# Patient Record
Sex: Male | Born: 1935 | Race: White | Hispanic: No | Marital: Married | Smoking: Never smoker
Health system: Southern US, Community
[De-identification: ages and names within clinical notes are randomized; demographics above are authoritative.]

## PROBLEM LIST (undated history)

## (undated) DIAGNOSIS — E789 Disorder of lipoprotein metabolism, unspecified: Secondary | ICD-10-CM

## (undated) DIAGNOSIS — E785 Hyperlipidemia, unspecified: Secondary | ICD-10-CM

## (undated) DIAGNOSIS — F0390 Unspecified dementia without behavioral disturbance: Secondary | ICD-10-CM

## (undated) DIAGNOSIS — I10 Essential (primary) hypertension: Secondary | ICD-10-CM

## (undated) HISTORY — PX: NASAL POLYP EXCISION: SHX2068

---

## 2017-12-30 DIAGNOSIS — U071 COVID-19: Secondary | ICD-10-CM

## 2017-12-30 HISTORY — DX: COVID-19: U07.1

## 2021-02-05 ENCOUNTER — Encounter (INDEPENDENT_AMBULATORY_CARE_PROVIDER_SITE_OTHER): Payer: Self-pay | Admitting: *Deleted

## 2021-04-02 ENCOUNTER — Inpatient Hospital Stay (HOSPITAL_COMMUNITY)
Admission: EM | Admit: 2021-04-02 | Discharge: 2021-04-06 | DRG: 065 | Disposition: A | Discharge status: Rehabilitation Facility | Payer: Medicare Other | Attending: Family Medicine | Admitting: Family Medicine

## 2021-04-02 ENCOUNTER — Emergency Department (HOSPITAL_COMMUNITY): Payer: Medicare Other

## 2021-04-02 ENCOUNTER — Other Ambulatory Visit: Payer: Self-pay

## 2021-04-02 ENCOUNTER — Encounter (HOSPITAL_COMMUNITY): Payer: Self-pay | Admitting: *Deleted

## 2021-04-02 DIAGNOSIS — F0390 Unspecified dementia without behavioral disturbance: Secondary | ICD-10-CM | POA: Diagnosis present

## 2021-04-02 DIAGNOSIS — Z20822 Contact with and (suspected) exposure to covid-19: Secondary | ICD-10-CM | POA: Diagnosis present

## 2021-04-02 DIAGNOSIS — R531 Weakness: Secondary | ICD-10-CM | POA: Diagnosis not present

## 2021-04-02 DIAGNOSIS — I714 Abdominal aortic aneurysm, without rupture, unspecified: Secondary | ICD-10-CM

## 2021-04-02 DIAGNOSIS — Z8616 Personal history of COVID-19: Secondary | ICD-10-CM

## 2021-04-02 DIAGNOSIS — N1831 Chronic kidney disease, stage 3a: Secondary | ICD-10-CM | POA: Diagnosis present

## 2021-04-02 DIAGNOSIS — I6381 Other cerebral infarction due to occlusion or stenosis of small artery: Principal | ICD-10-CM | POA: Diagnosis present

## 2021-04-02 DIAGNOSIS — M48061 Spinal stenosis, lumbar region without neurogenic claudication: Secondary | ICD-10-CM | POA: Diagnosis present

## 2021-04-02 DIAGNOSIS — M47816 Spondylosis without myelopathy or radiculopathy, lumbar region: Secondary | ICD-10-CM | POA: Diagnosis present

## 2021-04-02 DIAGNOSIS — E785 Hyperlipidemia, unspecified: Secondary | ICD-10-CM | POA: Diagnosis present

## 2021-04-02 DIAGNOSIS — R29898 Other symptoms and signs involving the musculoskeletal system: Secondary | ICD-10-CM

## 2021-04-02 DIAGNOSIS — Z79899 Other long term (current) drug therapy: Secondary | ICD-10-CM

## 2021-04-02 DIAGNOSIS — F0391 Unspecified dementia with behavioral disturbance: Secondary | ICD-10-CM | POA: Diagnosis present

## 2021-04-02 DIAGNOSIS — Z8249 Family history of ischemic heart disease and other diseases of the circulatory system: Secondary | ICD-10-CM

## 2021-04-02 DIAGNOSIS — Z87891 Personal history of nicotine dependence: Secondary | ICD-10-CM

## 2021-04-02 DIAGNOSIS — R262 Difficulty in walking, not elsewhere classified: Secondary | ICD-10-CM | POA: Diagnosis present

## 2021-04-02 DIAGNOSIS — I716 Thoracoabdominal aortic aneurysm, without rupture: Secondary | ICD-10-CM | POA: Diagnosis present

## 2021-04-02 DIAGNOSIS — E7800 Pure hypercholesterolemia, unspecified: Secondary | ICD-10-CM | POA: Diagnosis present

## 2021-04-02 DIAGNOSIS — R29703 NIHSS score 3: Secondary | ICD-10-CM | POA: Diagnosis present

## 2021-04-02 DIAGNOSIS — Z7401 Bed confinement status: Secondary | ICD-10-CM

## 2021-04-02 DIAGNOSIS — I129 Hypertensive chronic kidney disease with stage 1 through stage 4 chronic kidney disease, or unspecified chronic kidney disease: Secondary | ICD-10-CM | POA: Diagnosis present

## 2021-04-02 DIAGNOSIS — I10 Essential (primary) hypertension: Secondary | ICD-10-CM | POA: Diagnosis present

## 2021-04-02 DIAGNOSIS — I639 Cerebral infarction, unspecified: Secondary | ICD-10-CM | POA: Diagnosis present

## 2021-04-02 DIAGNOSIS — M549 Dorsalgia, unspecified: Secondary | ICD-10-CM | POA: Diagnosis present

## 2021-04-02 DIAGNOSIS — D696 Thrombocytopenia, unspecified: Secondary | ICD-10-CM

## 2021-04-02 DIAGNOSIS — Z91041 Radiographic dye allergy status: Secondary | ICD-10-CM

## 2021-04-02 DIAGNOSIS — R7303 Prediabetes: Secondary | ICD-10-CM

## 2021-04-02 HISTORY — DX: Hyperlipidemia, unspecified: E78.5

## 2021-04-02 HISTORY — DX: Unspecified dementia, unspecified severity, without behavioral disturbance, psychotic disturbance, mood disturbance, and anxiety: F03.90

## 2021-04-02 HISTORY — DX: Essential (primary) hypertension: I10

## 2021-04-02 HISTORY — DX: Disorder of lipoprotein metabolism, unspecified: E78.9

## 2021-04-02 LAB — DIFFERENTIAL
Abs Immature Granulocytes: 0.02 10*3/uL (ref 0.00–0.07)
Basophils Absolute: 0.1 10*3/uL (ref 0.0–0.1)
Basophils Relative: 1 %
Eosinophils Absolute: 0.5 10*3/uL (ref 0.0–0.5)
Eosinophils Relative: 6 %
Immature Granulocytes: 0 %
Lymphocytes Relative: 24 %
Lymphs Abs: 1.9 10*3/uL (ref 0.7–4.0)
Monocytes Absolute: 0.8 10*3/uL (ref 0.1–1.0)
Monocytes Relative: 9 %
Neutro Abs: 4.9 10*3/uL (ref 1.7–7.7)
Neutrophils Relative %: 60 %

## 2021-04-02 LAB — RESP PANEL BY RT-PCR (FLU A&B, COVID) ARPGX2
Influenza A by PCR: NEGATIVE
Influenza B by PCR: NEGATIVE
SARS Coronavirus 2 by RT PCR: NEGATIVE

## 2021-04-02 LAB — I-STAT CHEM 8, ED
BUN: 19 mg/dL (ref 8–23)
Calcium, Ion: 1.21 mmol/L (ref 1.15–1.40)
Chloride: 103 mmol/L (ref 98–111)
Creatinine, Ser: 1.5 mg/dL — ABNORMAL HIGH (ref 0.61–1.24)
Glucose, Bld: 100 mg/dL — ABNORMAL HIGH (ref 70–99)
HCT: 44 % (ref 39.0–52.0)
Hemoglobin: 15 g/dL (ref 13.0–17.0)
Potassium: 4.2 mmol/L (ref 3.5–5.1)
Sodium: 140 mmol/L (ref 135–145)
TCO2: 27 mmol/L (ref 22–32)

## 2021-04-02 LAB — COMPREHENSIVE METABOLIC PANEL
ALT: 15 U/L (ref 0–44)
AST: 16 U/L (ref 15–41)
Albumin: 3.5 g/dL (ref 3.5–5.0)
Alkaline Phosphatase: 93 U/L (ref 38–126)
Anion gap: 7 (ref 5–15)
BUN: 19 mg/dL (ref 8–23)
CO2: 27 mmol/L (ref 22–32)
Calcium: 8.6 mg/dL — ABNORMAL LOW (ref 8.9–10.3)
Chloride: 106 mmol/L (ref 98–111)
Creatinine, Ser: 1.49 mg/dL — ABNORMAL HIGH (ref 0.61–1.24)
GFR, Estimated: 47 mL/min — ABNORMAL LOW (ref >60)
Glucose, Bld: 105 mg/dL — ABNORMAL HIGH (ref 70–99)
Potassium: 4.2 mmol/L (ref 3.5–5.1)
Sodium: 140 mmol/L (ref 135–145)
Total Bilirubin: 0.7 mg/dL (ref 0.3–1.2)
Total Protein: 6.2 g/dL — ABNORMAL LOW (ref 6.5–8.1)

## 2021-04-02 LAB — CBC
HCT: 43.4 % (ref 39.0–52.0)
Hemoglobin: 14.1 g/dL (ref 13.0–17.0)
MCH: 29.7 pg (ref 26.0–34.0)
MCHC: 32.5 g/dL (ref 30.0–36.0)
MCV: 91.4 fL (ref 80.0–100.0)
Platelets: 146 10*3/uL — ABNORMAL LOW (ref 150–400)
RBC: 4.75 MIL/uL (ref 4.22–5.81)
RDW: 15.2 % (ref 11.5–15.5)
WBC: 8.2 10*3/uL (ref 4.0–10.5)
nRBC: 0 % (ref 0.0–0.2)

## 2021-04-02 LAB — ETHANOL: Alcohol, Ethyl (B): <10 mg/dL (ref <10)

## 2021-04-02 LAB — APTT: aPTT: 30 seconds (ref 24–36)

## 2021-04-02 LAB — PROTIME-INR
INR: 1.1 (ref 0.8–1.2)
Prothrombin Time: 14 seconds (ref 11.4–15.2)

## 2021-04-02 IMAGING — MR MR LUMBAR SPINE W/O CM
6 of 15 series · 12 of 48 positions shown · non-contrast
Comparison: None available.

CLINICAL DATA: Initial evaluation for low back pain, cauda equina
syndrome suspected.

EXAM:
MRI LUMBAR SPINE WITHOUT CONTRAST
TECHNIQUE: Multiplanar, multisequence MR imaging of the lumbar spine was
performed. No intravenous contrast was administered.

[Series 5: T2 · axial · 5.0mm · 0.23mm/px · z∈[-21,+126]mm · 2 of 26 slices shown (1 of 4)]
[im 1/26]
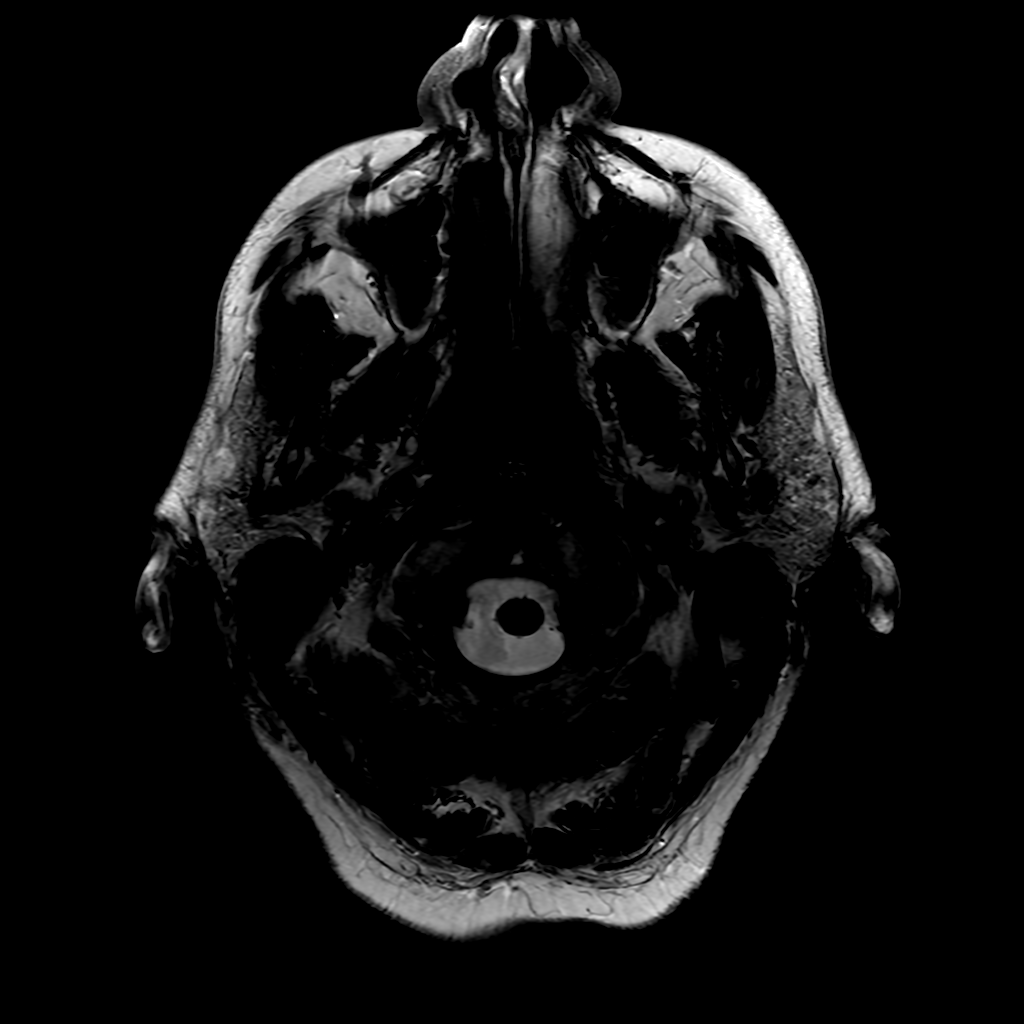
[im 26/26]
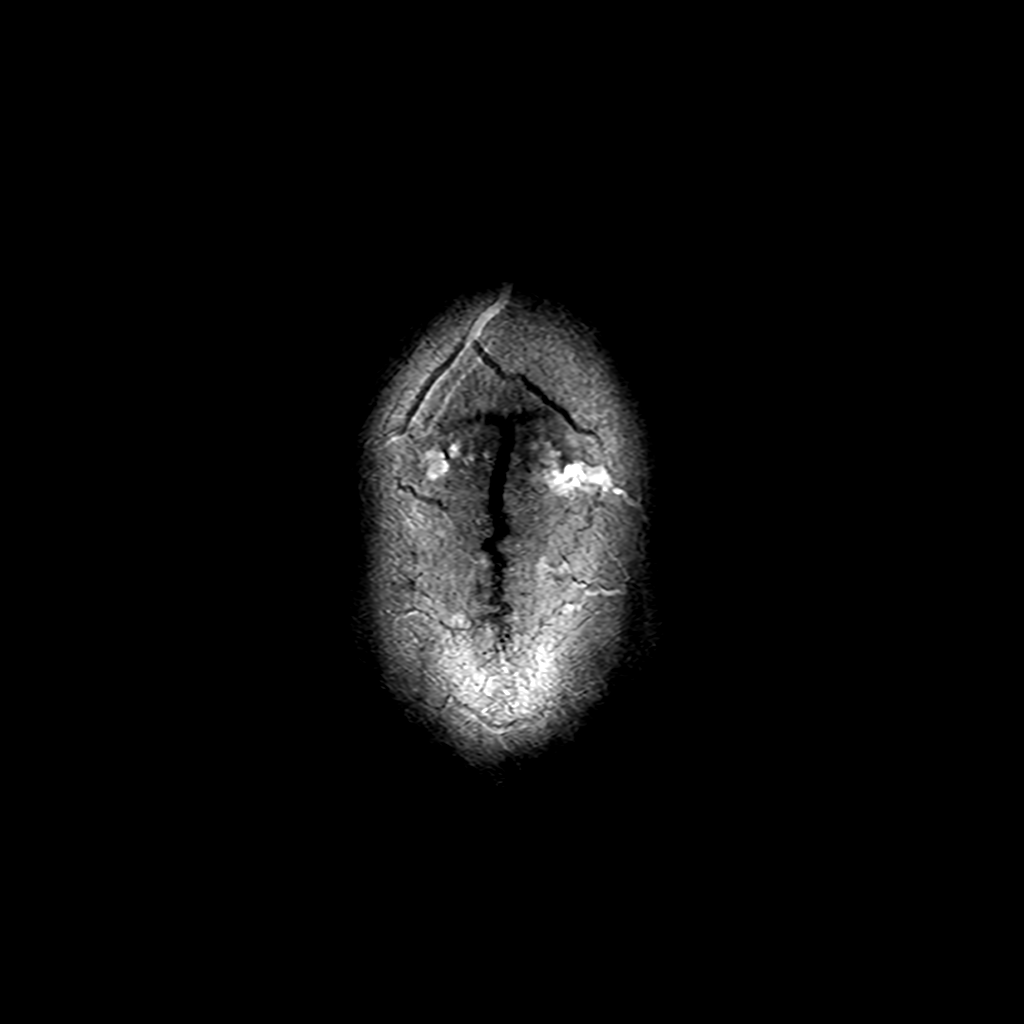

[Series 9: T2 · coronal · 5.0mm · 0.39mm/px · 2 of 31 slices shown (2 of 4)]
[im 1/31]
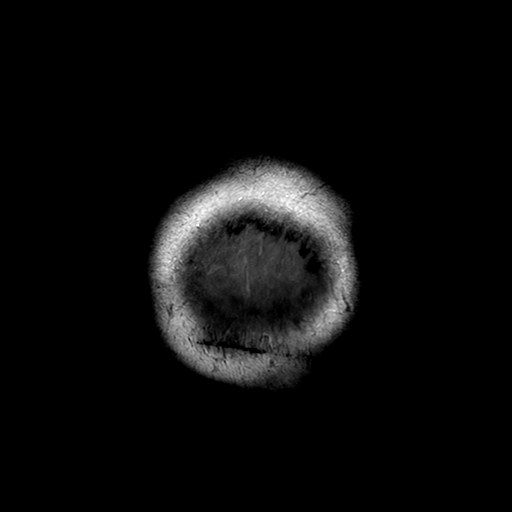
[im 31/31]
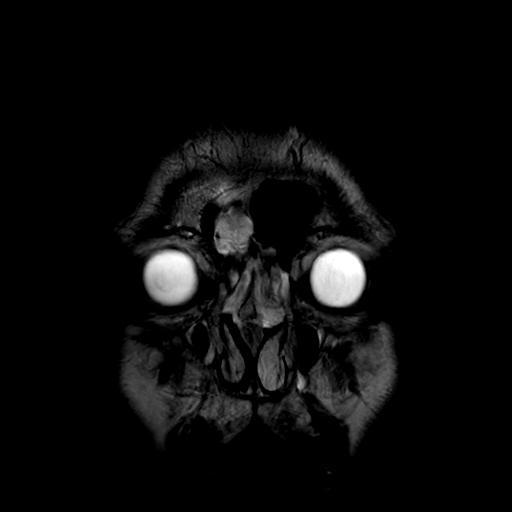

[Series 11: T2 · sagittal · 4.0mm · 0.55mm/px · 1 of 17 slices shown (3 of 4)]
[im 1/17]
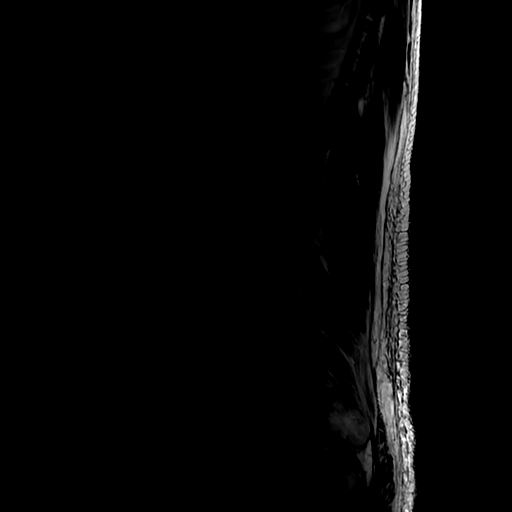

[Series 13: T1 · sagittal · 4.0mm · 0.55mm/px · 1 of 17 slices shown (1 of 2)]
[im 1/17]
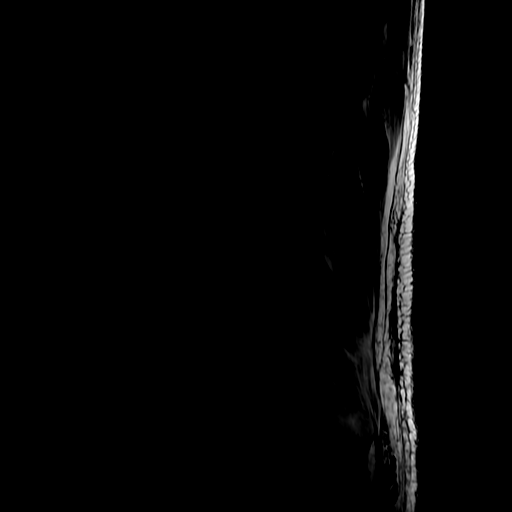

[Series 14: T2 · axial · 4.0mm · 0.39mm/px · z∈[-136,+88]mm · 3 of 46 slices shown (4 of 4)]
[im 1/46]
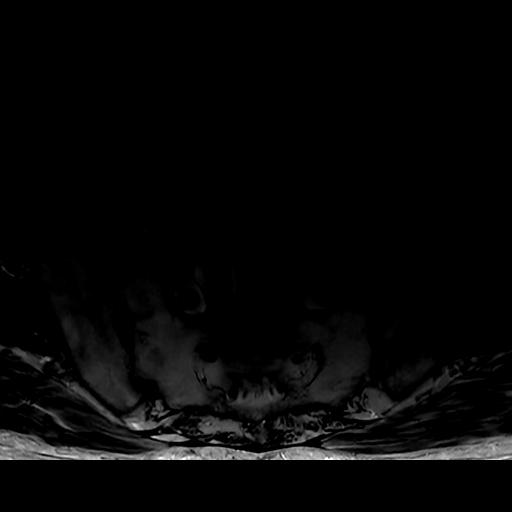
[im 23/46]
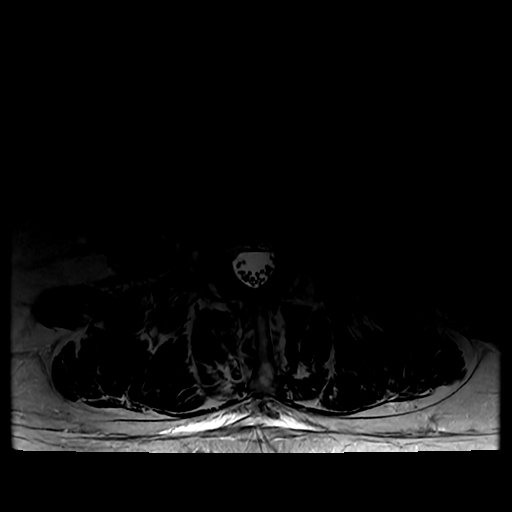
[im 46/46]
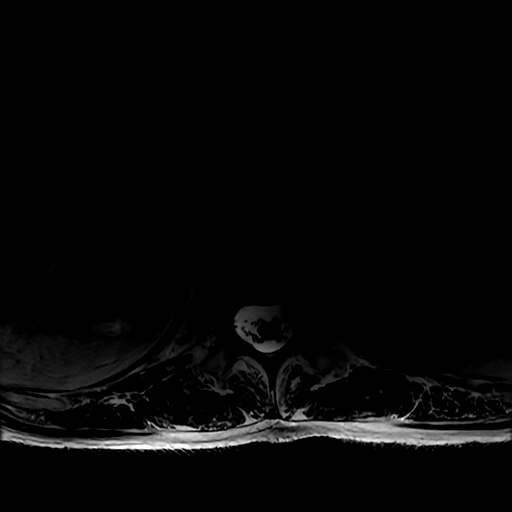

[Series 15: T1 · axial · 4.0mm · 0.39mm/px · z∈[-136,+88]mm · 3 of 46 slices shown (2 of 2)]
[im 1/46]
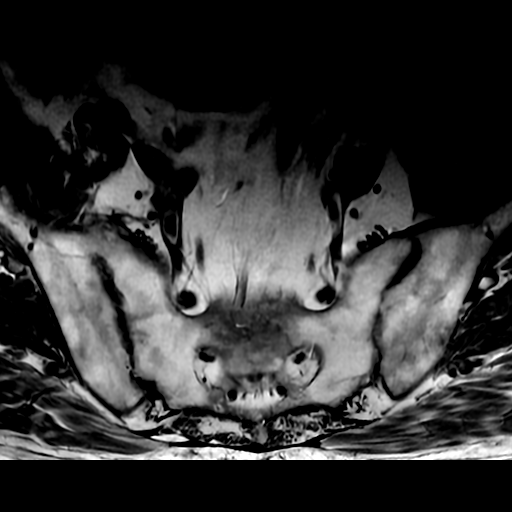
[im 23/46]
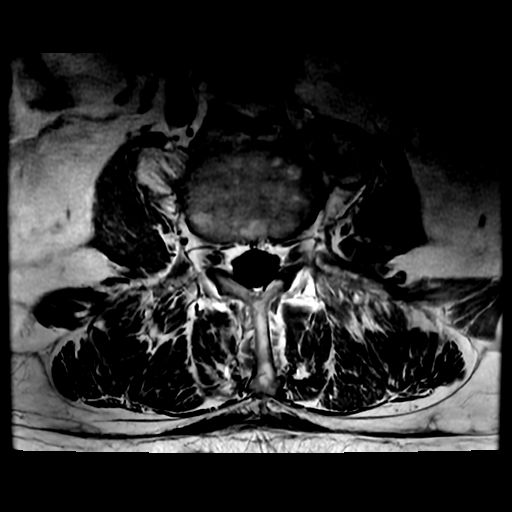
[im 46/46]
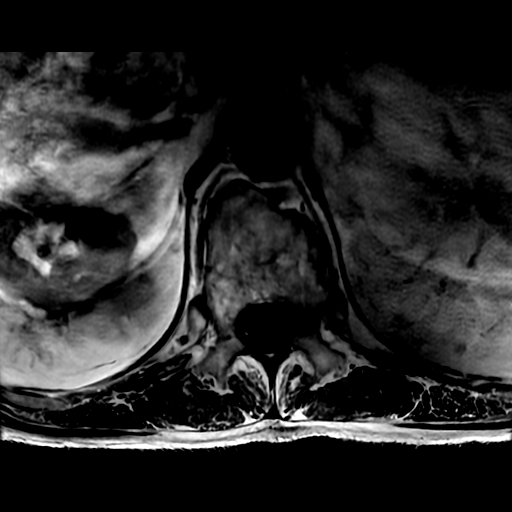

[12 of 48 positions shown; findings below may reference images not displayed]

FINDINGS: Segmentation: Standard. Lowest well-formed disc space labeled the
L5-S1 level.

Alignment: 4 mm anterolisthesis of L5 on S1, with trace 2 mm
retrolisthesis of L4 on L5. Findings chronic and facet mediated.
Alignment otherwise normal with preservation of the normal lumbar
lordosis.

Vertebrae: Vertebral body height maintained without acute or chronic
fracture. Bone marrow signal intensity diffusely heterogeneous
without discrete or worrisome osseous lesion. No abnormal marrow
edema.

Conus medullaris and cauda equina: Conus extends to the T12-L1
level. Conus and cauda equina appear normal.

Paraspinal and other soft tissues: Paraspinous soft tissues
demonstrate no acute finding. Multiple scattered benign appearing
cyst noted within the partially visualized kidneys, largest of which
measures 4.1 cm on the left. A large irregular multilobulated
aneurysm involving the intra-abdominal aorta partially visualized,
measuring up to at least 5.3 cm in AP diameter (series 11, image
12).

Disc levels:

T12-L1: Disc desiccation without significant disc bulge. Anterior
endplate osteophytic spurring. No spinal stenosis. Foramina remain
patent.

L1-2: Disc desiccation with mild disc bulge. Anterior endplate
osteophytic spurring. Mild facet and ligament flavum hypertrophy. No
significant spinal stenosis. Foramina remain patent.

L2-3: Diffuse disc bulge with disc desiccation. Reactive endplate
spurring. Mild facet and ligament flavum hypertrophy. No significant
spinal stenosis. Foramina remain patent.

L3-4: Advanced degenerative intervertebral disc space narrowing with
diffuse disc bulge and disc desiccation. Associated reactive
endplate spurring. Mild facet and ligament flavum hypertrophy. No
significant spinal stenosis. Mild bilateral L3 foraminal narrowing.
No frank impingement.

L4-5: Trace retrolisthesis. Degenerative intervertebral disc space
narrowing with diffuse disc bulge and disc desiccation. Superimposed
reactive endplate change with marginal endplate osteophytic
spurring. Superimposed small central disc protrusion indents the
ventral thecal sac (series 14, image 32). Moderate facet and
ligament flavum hypertrophy. Resultant mild canal with right worse
than left lateral recess stenosis. Moderate right worse than left L4
foraminal narrowing.

L5-S1: 4 mm anterolisthesis. Disc desiccation with mild annular disc
bulge. Severe right worse than left facet arthrosis with associated
small joint effusions. Secondary mild narrowing of the lateral
recesses. Central canal remains patent. Moderate right with mild
left L5 foraminal narrowing.
IMPRESSION: 1. No acute abnormality within the lumbar spine. No evidence for
cord compression.
2. Multifactorial degenerative changes at L4-5 with resultant mild
spinal stenosis, with moderate right worse than left L4 foraminal
narrowing.
3. 4 mm anterolisthesis of L5 on S1 with associated severe right
worse than left facet arthrosis, with resultant moderate right L5
foraminal narrowing.
4. Additional more mild degenerative disc bulging and facet
hypertrophy at L1-2 thru L3-4 without significant stenosis or neural
impingement.
5. Large irregular multilobulated aneurysm involving the
intra-abdominal aorta, partially visualized. Further assessment with
dedicated CTA of the abdomen and pelvis recommended for further
evaluation.

## 2021-04-02 IMAGING — CT CT HEAD W/O CM
3 series · 16 of 47 positions shown, 19 images · non-contrast
Comparison: None.

CLINICAL DATA: Acute neurological deficit with bilateral leg
weakness and numbness beginning this afternoon.

EXAM:
CT HEAD WITHOUT CONTRAST
TECHNIQUE: Contiguous axial images were obtained from the base of the skull
through the vertex without intravenous contrast.

[Series 2: head w o · axial · 0.44mm/px · z∈[-15,+120]mm · 10 of 33 slices shown, 13 images]
[im 3/33  brain]
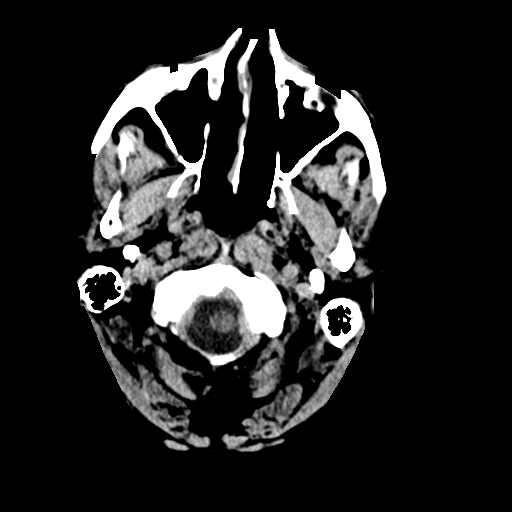
[im 3/33  bone]
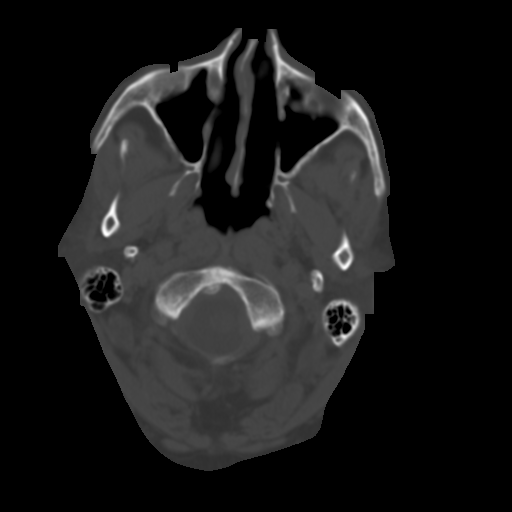
[im 6/33  brain]
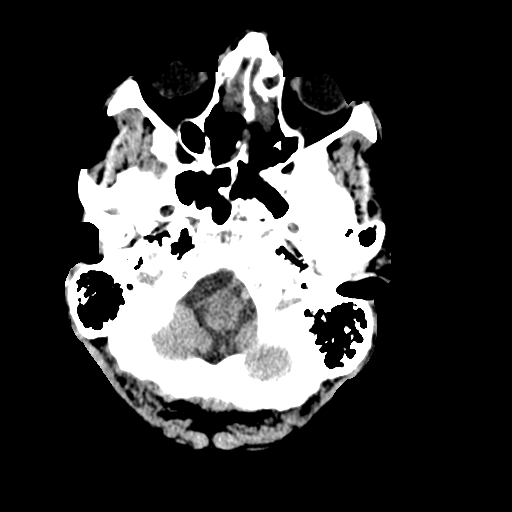
[im 9/33  brain]
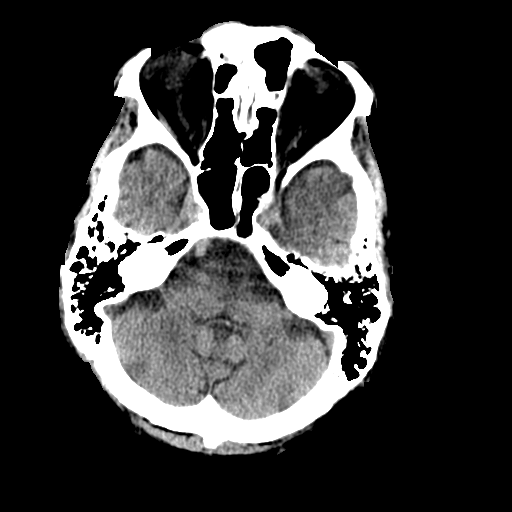
[im 12/33  brain]
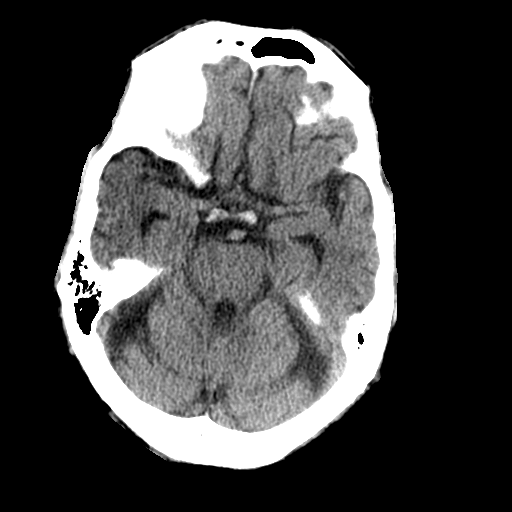
[im 15/33  brain]
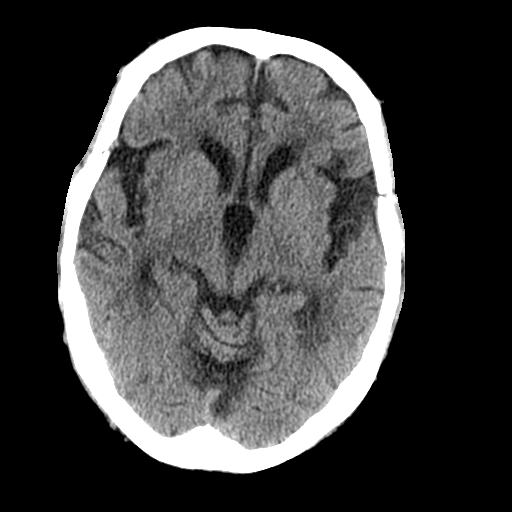
[im 15/33  bone]
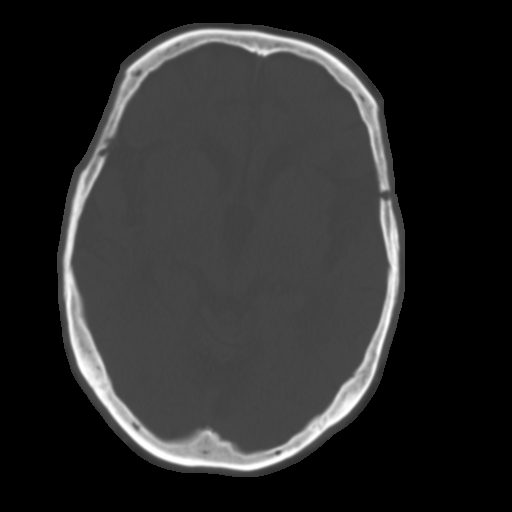
[im 18/33  brain]
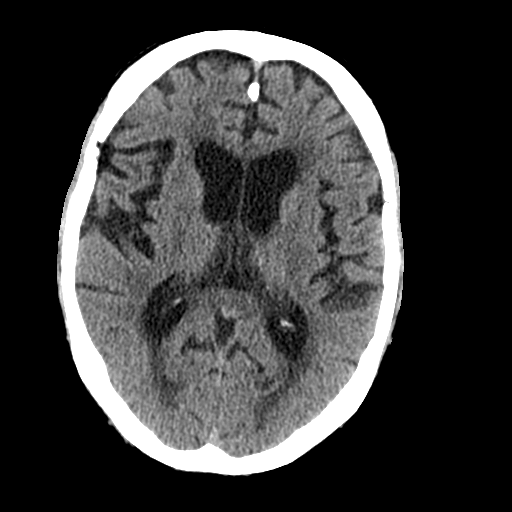
[im 21/33  brain]
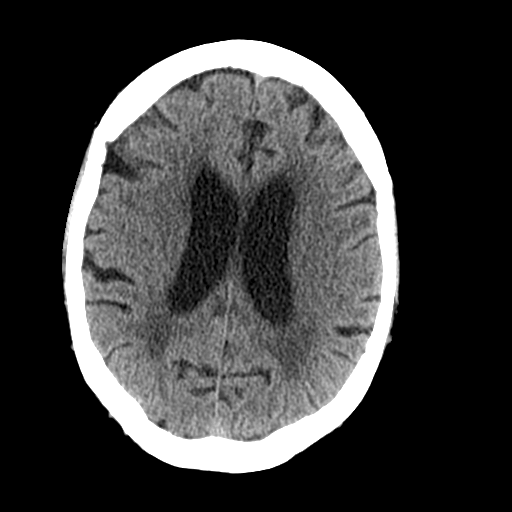
[im 25/33  brain]
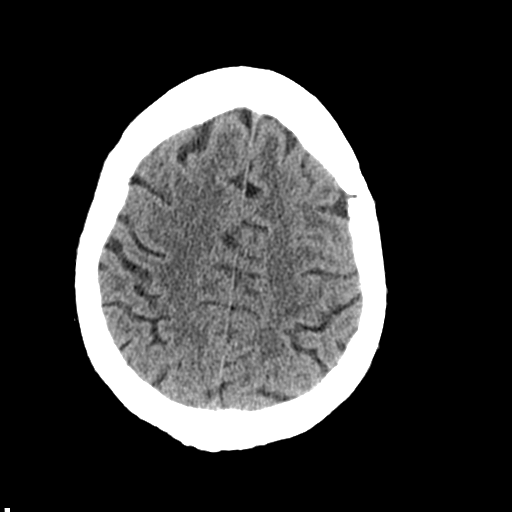
[im 27/33  brain]
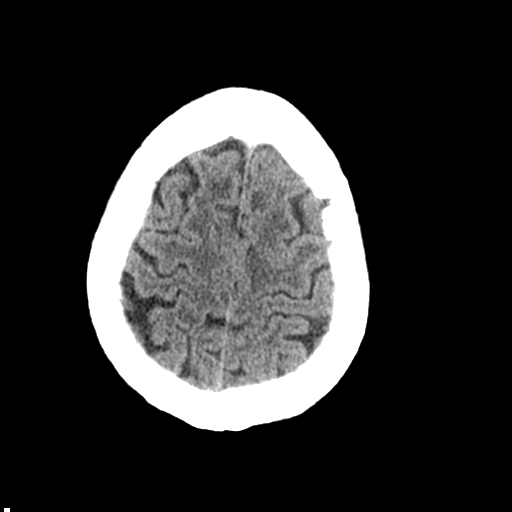
[im 27/33  bone]
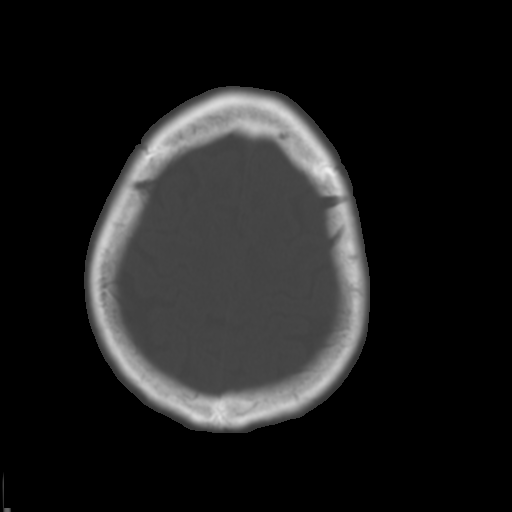
[im 30/33  brain]
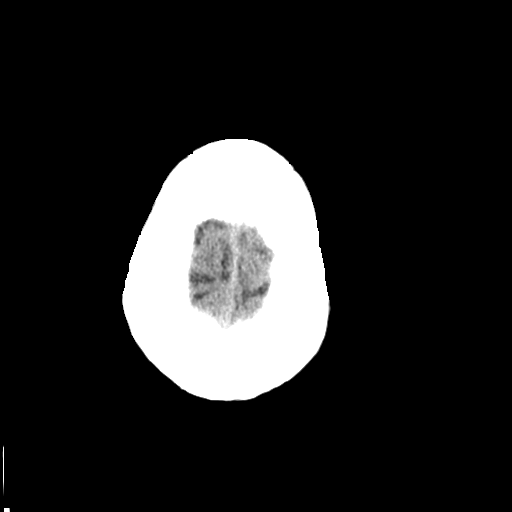

[Series 4: coronal soft · coronal · 0.33mm/px · 3 of 72 slices shown]
[im 24/72  brain]
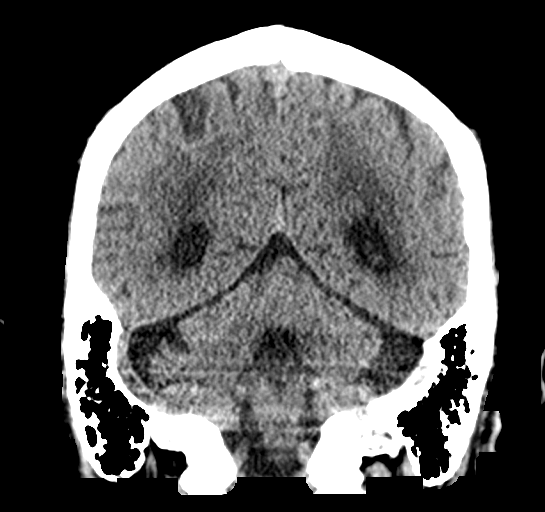
[im 32/72  brain]
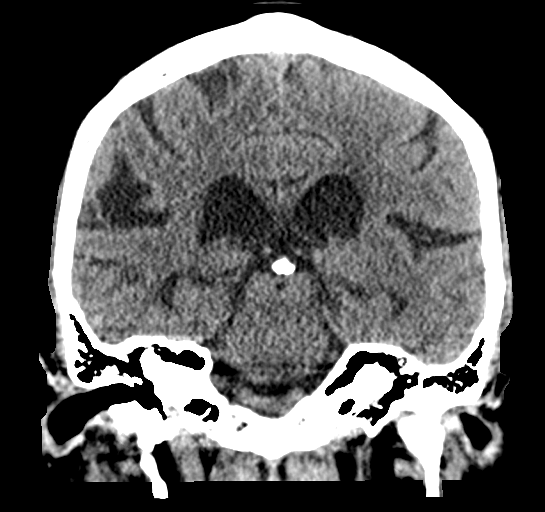
[im 40/72  brain]
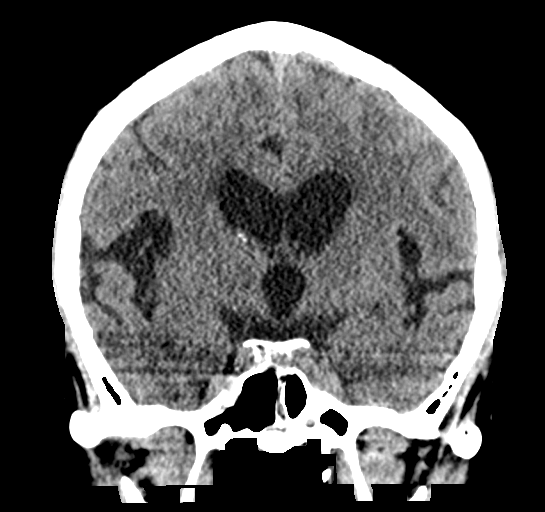

[Series 5: sagittal soft · sagittal · 0.36mm/px · 3 of 58 slices shown]
[im 20/58  brain]
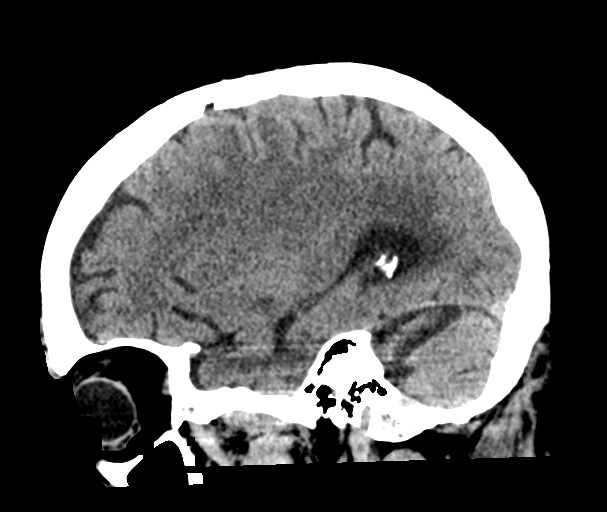
[im 29/58  brain]
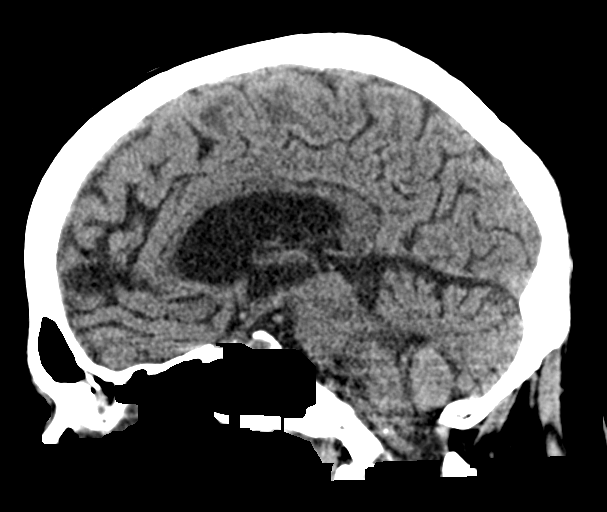
[im 39/58  brain]
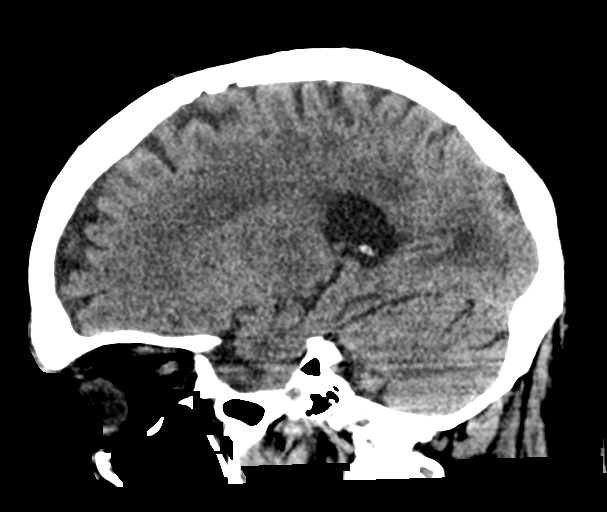

[16 of 47 positions shown; findings below may reference images not displayed]

FINDINGS: Brain: Diffuse cerebral atrophy. Ventricular dilatation consistent
with central atrophy. Low-attenuation changes in the deep white
matter consistent with small vessel ischemia. No abnormal
extra-axial fluid collections. No mass effect or midline shift.
Gray-white matter junctions are distinct. Basal cisterns are not
effaced. No acute intracranial hemorrhage.

Vascular: No hyperdense vessel or unexpected calcification.

Skull: Normal. Negative for fracture or focal lesion.

Sinuses/Orbits: Postoperative changes in the maxillary antra and
ethmoid air cells. Mild mucosal thickening in the paranasal sinuses.
No acute air-fluid levels. Mastoid air cells are clear.

Other: None.
IMPRESSION: 1. No acute intracranial abnormalities.
2. Chronic atrophy and small vessel ischemia.

## 2021-04-02 MED ORDER — SODIUM CHLORIDE 0.9 % IV SOLN: 100.0000 mL/h | INTRAVENOUS | Status: DC

## 2021-04-02 MED ORDER — SODIUM CHLORIDE 0.9 % IV BOLUS
500.0000 mL | Freq: Once | INTRAVENOUS | Status: AC
  Administered 2021-04-02: 500 mL via INTRAVENOUS

## 2021-04-02 NOTE — ED Provider Notes (Signed)
[facility] EMERGENCY DEPARTMENT Provider Note   CSN: [id number] Arrival date & time: 04/02/21  1752     History Chief Complaint  Patient presents with  . Extremity Weakness    Maxwell Rollins is a 85 y.o. male.  Pt presents to the ED today with numbness in both legs.  The pt said he was fine this morning.  He actually had a doctor's appointment this morning for a well visit and things looked good.  He and his wife went out to dinner and after sitting, he could not get up.  He felt like both legs could not move.  The pt denies back pain, but his wife said he did say that he had some back pain.  Pt's wife said they got some help to get him to the car.  They went back home and his son helped get him in the house.  They tried several times to get him up, but he still felt like his legs were not working.  He denied any urine incontinence.  The pt denies any numbness.  No other neuro sx.        Past Medical History:  Diagnosis Date  . Abnormal serum cholesterol     There are no problems to display for this patient.  Diarrhea (Discharge Diagnosis) - 01/31/21 Dementia (Discharge Diagnosis) - 01/31/21 Vertigo (Discharge Diagnosis) - 01/31/21 Hyperlipidemia (Discharge Diagnosis) - 01/31/21 Hypercholesterolemia (Discharge Diagnosis) - 01/31/21 COVID-19 (Discharge Diagnosis) - 01/31/21 Hypertension (Discharge Diagnosis) - 01/31/21    No family history on file.  Social History   Tobacco Use  . Smoking status: Never Smoker  . Smokeless tobacco: Never Used  Substance Use Topics  . Alcohol use: Never  . Drug use: Never    Home Medications Prior to Admission medications   Not on File   Medications Reconcile with Patient's Chart  atorvastatin 20 mg oral tablet 1 tab, oral, daily, # 90 tab, 0 Refill(s), Pharmacy: [facility] Start Date: 11/22/20 Status: Ordered  hydrocortisone 2.5% topical cream 1 app, topical, daily, # 28.4 g, 2 Refill(s), Pharmacy: [facility], 184, cm, 03/04/19 14:55:00 EST, Height/Length Measured Start Date: 04/29/19 Status: Ordered  meclizine 25 mg oral tablet 1/2 tab, oral, TID, PRN dizziness, # 90 tab, 3 Refill(s), Pharmacy: [facility] Start Date: 01/31/21 Status: Ordered  Saline Mist 0.65% nasal spray sprays, nasal, 0 Refill(s) Start Date: 03/04/18 Status: Ordered  Vitamin B-12 1000 mcg oral tablet 1,000 mcg = 1 tab, Oral, Daily, # 30 tab, 0 Refill(s) Start Date: 03/04/18 Status: Ordered  Vitamin B6 daily, 0 Refill(s) Start Date: 01/31/21 Status: Ordered  Vitamin C 500 mg oral tablet 500 mg = 1 tab, Oral, Daily, # 30 tab, 0 Refill(s) Start Date: 03/04/18 Status: Ordered  Vitamin D3 1250 mcg (50,000 intl units) oral capsule 50,000 IntlUnit = 1 cap, oral, every week, with food, # 13 cap, 4 Refill(s), Pharmacy: [facility] Date: 01/31/21 Status: Ordered   Allergies    Red dye  Review of Systems   Review of Systems  Neurological: Positive for weakness.  All other systems reviewed and are negative.   Physical Exam Updated Vital Signs BP 138/79 (BP Location: Left Arm)   Pulse (!) 58   Temp 98.6 F (37 C)   Resp 14   Ht 6\' 2"  (1.88 m)   Wt 92.1 kg   SpO2 99%   BMI 26.06 kg/m   Physical Exam Vitals and nursing note reviewed.  Constitutional:  Appearance: Normal appearance.  HENT:     Head: Normocephalic and atraumatic.     Right Ear: External ear normal.     Left Ear: External ear normal.     Nose: Nose normal.     Mouth/Throat:     Mouth: Mucous membranes are moist.     Pharynx: Oropharynx is clear.  Eyes:     Extraocular Movements: Extraocular movements intact.     Conjunctiva/sclera: Conjunctivae normal.     Pupils: Pupils are equal, round, and reactive to light.  Cardiovascular:     Rate and Rhythm: Normal rate and regular rhythm.     Pulses: Normal pulses.     Heart sounds: Normal heart sounds.  Pulmonary:     Effort: Pulmonary effort is normal.     Breath  sounds: Normal breath sounds.  Abdominal:     General: Abdomen is flat. Bowel sounds are normal.     Palpations: Abdomen is soft.  Musculoskeletal:        General: Normal range of motion.     Cervical back: Normal range of motion and neck supple.  Skin:    General: Skin is warm.     Capillary Refill: Capillary refill takes less than 2 seconds.  Neurological:     Mental Status: He is alert and oriented to person, place, and time.     Comments: Bilateral leg mild weakness  Psychiatric:        Mood and Affect: Mood normal.        Behavior: Behavior normal.     ED Results / Procedures / Treatments   Labs (all labs ordered are listed, but only abnormal results are displayed) Labs Reviewed  CBC - Abnormal; Notable for the following components:      Result Value   Platelets 146 (*)    All other components within normal limits  COMPREHENSIVE METABOLIC PANEL - Abnormal; Notable for the following components:   Glucose, Bld 105 (*)    Creatinine, Ser 1.49 (*)    Calcium 8.6 (*)    Total Protein 6.2 (*)    GFR, Estimated 47 (*)    All other components within normal limits  I-STAT CHEM 8, ED - Abnormal; Notable for the following components:   Creatinine, Ser 1.50 (*)    Glucose, Bld 100 (*)    All other components within normal limits  RESP PANEL BY RT-PCR (FLU A&B, COVID) ARPGX2  ETHANOL  PROTIME-INR  APTT  DIFFERENTIAL  RAPID URINE DRUG SCREEN, HOSP PERFORMED  URINALYSIS, ROUTINE W REFLEX MICROSCOPIC    EKG EKG Interpretation  Date/Time:  Monday April 02 2021 18:40:02 EDT Ventricular Rate:  60 PR Interval:  142 QRS Duration: 95 QT Interval:  441 QTC Calculation: 441 R Axis:   -49 Text Interpretation: Sinus rhythm Atrial premature complex Left anterior fascicular block Abnormal R-wave progression, early transition Consider anterior infarct Baseline wander in lead(s) V4 No old tracing to compare Confirmed by Jacalyn Lefevre [phone]) on 04/02/2021 8:01:04 PM   Radiology CT  HEAD WO CONTRAST  Result Date: 04/02/2021 CLINICAL DATA:  Acute neurological deficit with bilateral leg weakness and numbness beginning this afternoon. EXAM: CT HEAD WITHOUT CONTRAST TECHNIQUE: Contiguous axial images were obtained from the base of the skull through the vertex without intravenous contrast. COMPARISON:  None. FINDINGS: Brain: Diffuse cerebral atrophy. Ventricular dilatation consistent with central atrophy. Low-attenuation changes in the deep white matter consistent with small vessel ischemia. No abnormal extra-axial fluid collections. No mass effect or midline shift.  Gray-white matter junctions are distinct. Basal cisterns are not effaced. No acute intracranial hemorrhage. Vascular: No hyperdense vessel or unexpected calcification. Skull: Normal. Negative for fracture or focal lesion. Sinuses/Orbits: Postoperative changes in the maxillary antra and ethmoid air cells. Mild mucosal thickening in the paranasal sinuses. No acute air-fluid levels. Mastoid air cells are clear. Other: None. IMPRESSION: 1. No acute intracranial abnormalities. 2. Chronic atrophy and small vessel ischemia. Electronically Signed   By: Burman Nieves M.D.   On: 04/02/2021 19:15    Procedures Procedures   Medications Ordered in ED Medications  sodium chloride 0.9 % bolus 500 mL (500 mLs Intravenous New Bag/Given 04/02/21 1845)    Followed by  0.9 %  sodium chloride infusion (has no administration in time range)    ED Course  I have reviewed the triage vital signs and the nursing notes.  Pertinent labs & imaging results that were available during my care of the patient were reviewed by me and considered in my medical decision making (see chart for details).    MDM Rules/Calculators/A&P                          Pt has no bladder incontinence and no numbness.  Due to the weakness in both legs, he needs a MRI.  We are unable to do this in the ED here, so I am going to send pt to [facility].  I spoke with Dr. Rush Landmark at  [facility] who will accept pt for transfer.    Final Clinical Impression(s) / ED Diagnoses Final diagnoses:  Weakness of both lower extremities    Rx / DC Orders ED Discharge Orders    None       Jacalyn Lefevre, MD 04/02/21 2001

## 2021-04-02 NOTE — ED Notes (Addendum)
Transfer paperwork given to family. They will be transporting pt to [facility].   Pt ambulated to wheelchair without difficulty.  IV left in place, per provider request.

## 2021-04-02 NOTE — ED Triage Notes (Signed)
Numbness in both legs onset after lunch today. States  He has back pain

## 2021-04-02 NOTE — ED Provider Notes (Signed)
10:26 PM Patient transferred from [facility] for MRI to rule out abnormalities causing the leg weakness symptoms and back pain today.  Patient has MRIs ordered of the brain and lumbar spine to rule out abnormalities.  Anticipate reassessment after imaging is completed.  10:45 PM I went to assess patient and he reports his leg weakness may be doing better than it was earlier.  He still denies numbness or any urinary or GI symptoms.  Denies fevers or chills.  Reports his back pain has improved but it was bothering him earlier.  Denies any headaches or any other neurologic deficits.  On my exam, he did not have any numbness, tingling, or weakness of upper or lower extremities.  No focal neurologic deficits.  Anticipate reassessment after MRIs are completed  1:08 AM MRI of the brain does show evidence of small stroke.  MRI of the lumbar spine also surprisingly shows an aneurysm that is very abnormal appearing.  I spoke to neurology who recommends further work-up and admission ultimately for the stroke findings on the brain however, she does suspect that the aneurysm may be contributing to the bilateral leg weakness and back pain.  We will get the CT dissection study to further evaluate.  He has a dye allergy so we will give pretreatment with steroids and Benadryl.  Care transferred to oncoming team awaiting for results of dissection study and official recommendations from neurology.  Anticipate admission.  Patient's blood pressure on my last evaluation was 131 systolic.    Clinical Impression: 1. Weakness of both lower extremities     Disposition: Admit  This note was prepared with assistance of Dragon voice recognition software. Occasional wrong-word or sound-a-like substitutions may have occurred due to the inherent limitations of voice recognition software.       Rania Prothero, Canary Brim, MD 04/03/21 [phone]

## 2021-04-03 ENCOUNTER — Emergency Department (HOSPITAL_COMMUNITY): Payer: Medicare Other

## 2021-04-03 ENCOUNTER — Encounter (HOSPITAL_COMMUNITY): Payer: Self-pay | Admitting: Internal Medicine

## 2021-04-03 ENCOUNTER — Inpatient Hospital Stay (HOSPITAL_COMMUNITY): Payer: Medicare Other

## 2021-04-03 DIAGNOSIS — I639 Cerebral infarction, unspecified: Secondary | ICD-10-CM | POA: Diagnosis present

## 2021-04-03 DIAGNOSIS — I69311 Memory deficit following cerebral infarction: Secondary | ICD-10-CM | POA: Diagnosis not present

## 2021-04-03 DIAGNOSIS — R531 Weakness: Secondary | ICD-10-CM | POA: Diagnosis present

## 2021-04-03 DIAGNOSIS — Z87891 Personal history of nicotine dependence: Secondary | ICD-10-CM | POA: Diagnosis not present

## 2021-04-03 DIAGNOSIS — I69398 Other sequelae of cerebral infarction: Secondary | ICD-10-CM | POA: Diagnosis present

## 2021-04-03 DIAGNOSIS — D696 Thrombocytopenia, unspecified: Secondary | ICD-10-CM | POA: Diagnosis present

## 2021-04-03 DIAGNOSIS — Z91048 Other nonmedicinal substance allergy status: Secondary | ICD-10-CM | POA: Diagnosis not present

## 2021-04-03 DIAGNOSIS — E785 Hyperlipidemia, unspecified: Secondary | ICD-10-CM | POA: Diagnosis present

## 2021-04-03 DIAGNOSIS — Z91041 Radiographic dye allergy status: Secondary | ICD-10-CM | POA: Diagnosis not present

## 2021-04-03 DIAGNOSIS — M549 Dorsalgia, unspecified: Secondary | ICD-10-CM | POA: Diagnosis present

## 2021-04-03 DIAGNOSIS — I716 Thoracoabdominal aortic aneurysm, without rupture: Secondary | ICD-10-CM | POA: Diagnosis present

## 2021-04-03 DIAGNOSIS — N179 Acute kidney failure, unspecified: Secondary | ICD-10-CM | POA: Diagnosis not present

## 2021-04-03 DIAGNOSIS — R29703 NIHSS score 3: Secondary | ICD-10-CM | POA: Diagnosis present

## 2021-04-03 DIAGNOSIS — I69318 Other symptoms and signs involving cognitive functions following cerebral infarction: Secondary | ICD-10-CM | POA: Diagnosis not present

## 2021-04-03 DIAGNOSIS — E7800 Pure hypercholesterolemia, unspecified: Secondary | ICD-10-CM | POA: Diagnosis present

## 2021-04-03 DIAGNOSIS — I129 Hypertensive chronic kidney disease with stage 1 through stage 4 chronic kidney disease, or unspecified chronic kidney disease: Secondary | ICD-10-CM | POA: Diagnosis present

## 2021-04-03 DIAGNOSIS — Z7401 Bed confinement status: Secondary | ICD-10-CM | POA: Diagnosis not present

## 2021-04-03 DIAGNOSIS — I6389 Other cerebral infarction: Secondary | ICD-10-CM | POA: Diagnosis not present

## 2021-04-03 DIAGNOSIS — F0391 Unspecified dementia with behavioral disturbance: Secondary | ICD-10-CM | POA: Diagnosis present

## 2021-04-03 DIAGNOSIS — I6381 Other cerebral infarction due to occlusion or stenosis of small artery: Secondary | ICD-10-CM | POA: Diagnosis present

## 2021-04-03 DIAGNOSIS — R4189 Other symptoms and signs involving cognitive functions and awareness: Secondary | ICD-10-CM | POA: Diagnosis present

## 2021-04-03 DIAGNOSIS — Z79899 Other long term (current) drug therapy: Secondary | ICD-10-CM | POA: Diagnosis not present

## 2021-04-03 DIAGNOSIS — I10 Essential (primary) hypertension: Secondary | ICD-10-CM | POA: Diagnosis present

## 2021-04-03 DIAGNOSIS — Z8616 Personal history of COVID-19: Secondary | ICD-10-CM | POA: Diagnosis not present

## 2021-04-03 DIAGNOSIS — R7303 Prediabetes: Secondary | ICD-10-CM | POA: Diagnosis present

## 2021-04-03 DIAGNOSIS — M47816 Spondylosis without myelopathy or radiculopathy, lumbar region: Secondary | ICD-10-CM | POA: Diagnosis present

## 2021-04-03 DIAGNOSIS — Z8249 Family history of ischemic heart disease and other diseases of the circulatory system: Secondary | ICD-10-CM | POA: Diagnosis not present

## 2021-04-03 DIAGNOSIS — R490 Dysphonia: Secondary | ICD-10-CM | POA: Diagnosis present

## 2021-04-03 DIAGNOSIS — R29898 Other symptoms and signs involving the musculoskeletal system: Secondary | ICD-10-CM | POA: Diagnosis not present

## 2021-04-03 DIAGNOSIS — I712 Thoracic aortic aneurysm, without rupture: Secondary | ICD-10-CM | POA: Diagnosis present

## 2021-04-03 DIAGNOSIS — M48061 Spinal stenosis, lumbar region without neurogenic claudication: Secondary | ICD-10-CM | POA: Diagnosis present

## 2021-04-03 DIAGNOSIS — R262 Difficulty in walking, not elsewhere classified: Secondary | ICD-10-CM | POA: Diagnosis present

## 2021-04-03 DIAGNOSIS — R278 Other lack of coordination: Secondary | ICD-10-CM | POA: Diagnosis present

## 2021-04-03 DIAGNOSIS — I714 Abdominal aortic aneurysm, without rupture: Secondary | ICD-10-CM | POA: Diagnosis present

## 2021-04-03 DIAGNOSIS — Z20822 Contact with and (suspected) exposure to covid-19: Secondary | ICD-10-CM | POA: Diagnosis present

## 2021-04-03 DIAGNOSIS — F05 Delirium due to known physiological condition: Secondary | ICD-10-CM | POA: Diagnosis not present

## 2021-04-03 DIAGNOSIS — I6931 Attention and concentration deficit following cerebral infarction: Secondary | ICD-10-CM | POA: Diagnosis not present

## 2021-04-03 DIAGNOSIS — F0390 Unspecified dementia without behavioral disturbance: Secondary | ICD-10-CM | POA: Diagnosis present

## 2021-04-03 DIAGNOSIS — N1831 Chronic kidney disease, stage 3a: Secondary | ICD-10-CM | POA: Diagnosis present

## 2021-04-03 LAB — LIPID PANEL
Cholesterol: 169 mg/dL (ref 0–200)
HDL: 46 mg/dL (ref >40)
LDL Cholesterol: 110 mg/dL — ABNORMAL HIGH (ref 0–99)
Total CHOL/HDL Ratio: 3.7 RATIO
Triglycerides: 67 mg/dL (ref <150)
VLDL: 13 mg/dL (ref 0–40)

## 2021-04-03 LAB — URINALYSIS, ROUTINE W REFLEX MICROSCOPIC
Bacteria, UA: NONE SEEN
Bilirubin Urine: NEGATIVE
Glucose, UA: NEGATIVE mg/dL
Ketones, ur: 5 mg/dL — AB
Leukocytes,Ua: NEGATIVE
Nitrite: NEGATIVE
Protein, ur: NEGATIVE mg/dL
Specific Gravity, Urine: 1.012 (ref 1.005–1.030)
pH: 5 (ref 5.0–8.0)

## 2021-04-03 LAB — RAPID URINE DRUG SCREEN, HOSP PERFORMED
Amphetamines: NOT DETECTED
Barbiturates: NOT DETECTED
Benzodiazepines: NOT DETECTED
Cocaine: NOT DETECTED
Opiates: NOT DETECTED
Tetrahydrocannabinol: NOT DETECTED

## 2021-04-03 LAB — HEMOGLOBIN A1C
Hgb A1c MFr Bld: 5.8 % — ABNORMAL HIGH (ref 4.8–5.6)
Mean Plasma Glucose: 119.76 mg/dL

## 2021-04-03 LAB — ECHOCARDIOGRAM COMPLETE
Area-P 1/2: 7.99 cm2
Height: 74 in
S' Lateral: 3.4 cm
Weight: 3248 oz

## 2021-04-03 LAB — VITAMIN B12: Vitamin B-12: 301 pg/mL (ref 180–914)

## 2021-04-03 LAB — TSH: TSH: 1.239 u[IU]/mL (ref 0.350–4.500)

## 2021-04-03 IMAGING — MR MR MRA HEAD W/O CM
1 series · 20 of 48 positions shown · non-contrast
Comparison: MRI yesterday.

CLINICAL DATA: Vertigo.  Numbness of the feet.  Dementia.

EXAM:
MRA HEAD WITHOUT CONTRAST
TECHNIQUE: Angiographic images of the Circle of Willis were obtained using MRA
technique without intravenous contrast.

[Series 3: (id) mt fs · axial · 1.4mm · 0.43mm/px · z∈[-42,+47]mm · 20 of 136 slices shown]
[im 1/136]
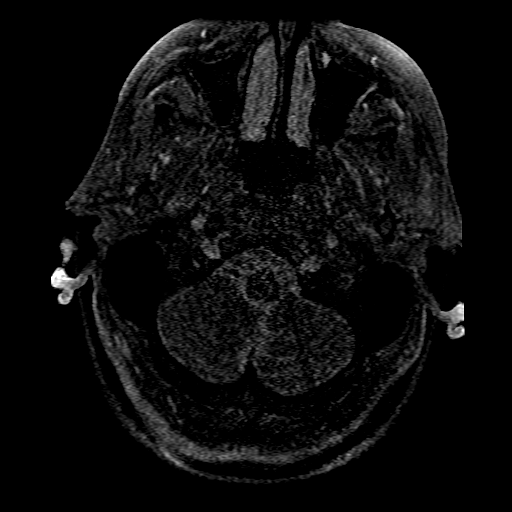
[im 3/136]
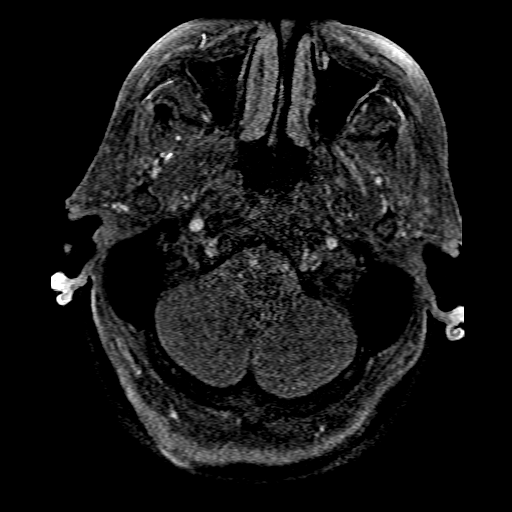
[im 6/136]
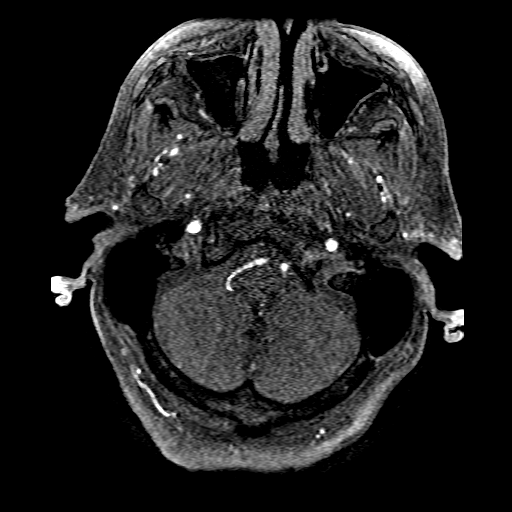
[im 9/136]
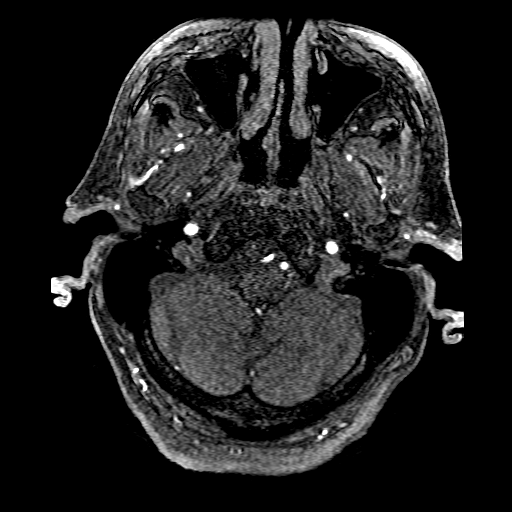
[im 12/136]
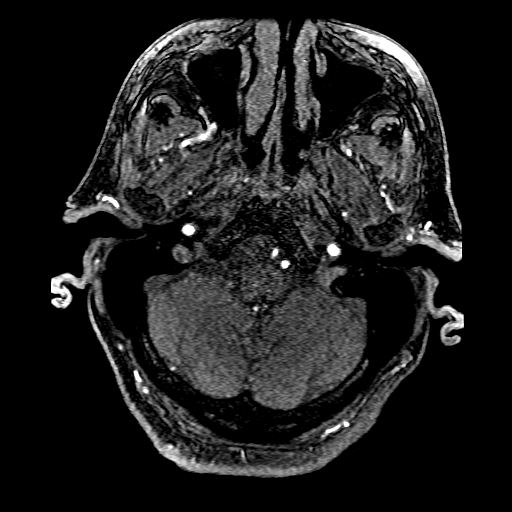
[im 15/136]
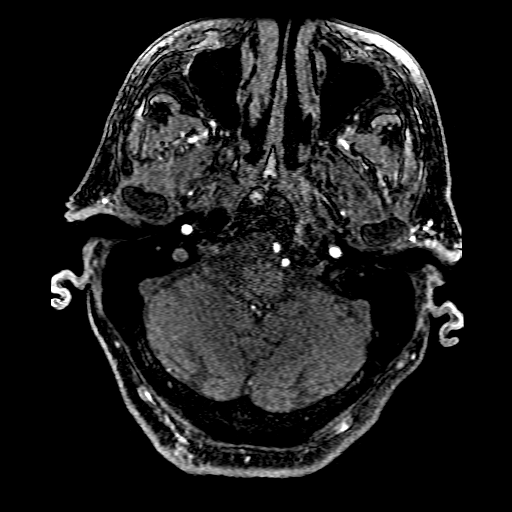
[im 18/136]
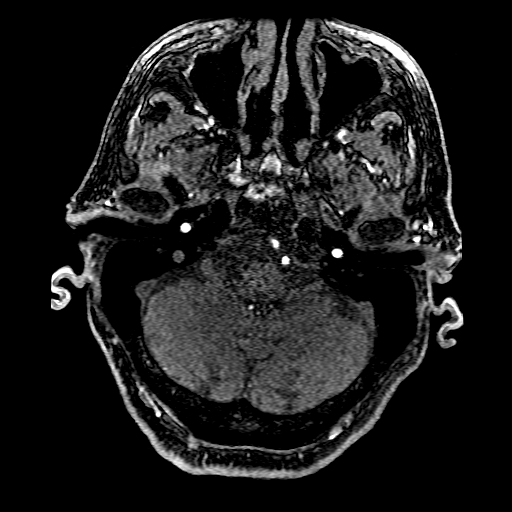
[im 21/136]
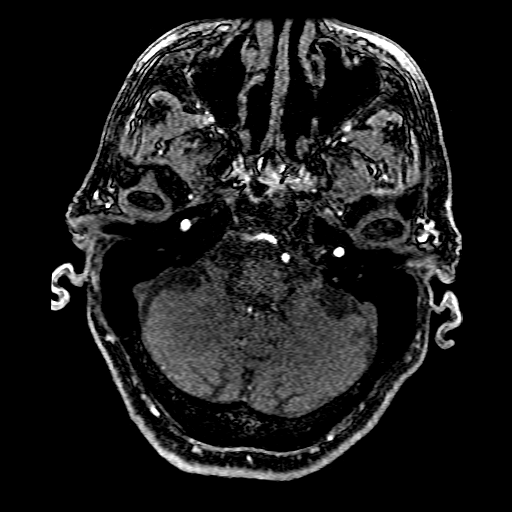
[im 23/136]
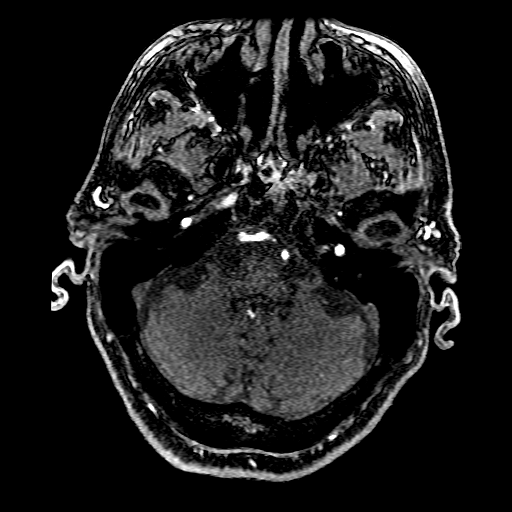
[im 26/136]
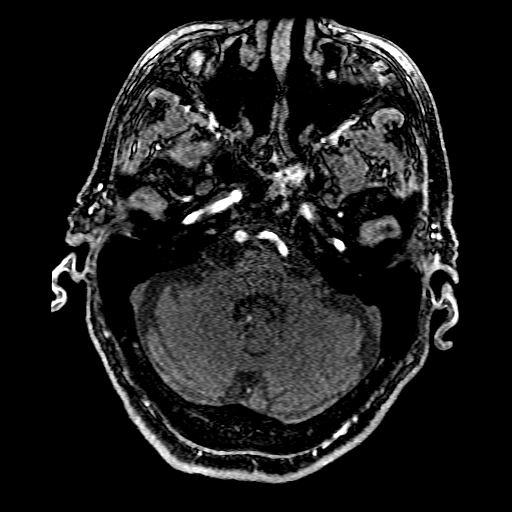
[im 29/136]
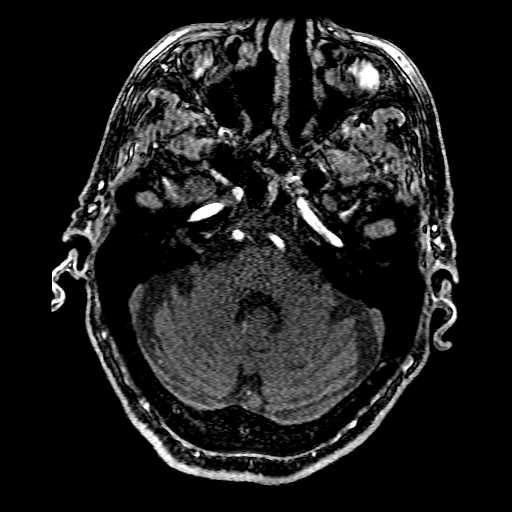
[im 32/136]
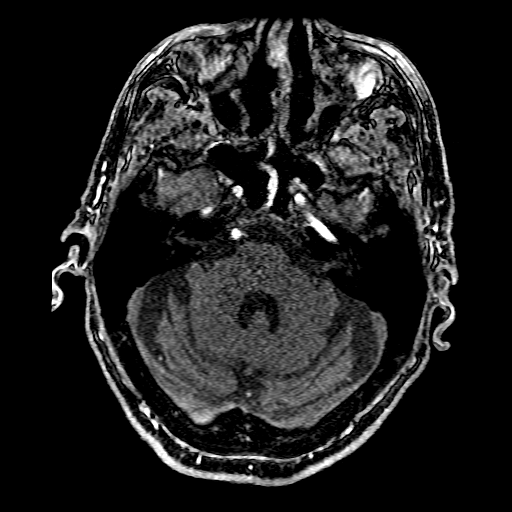
[im 44/136]
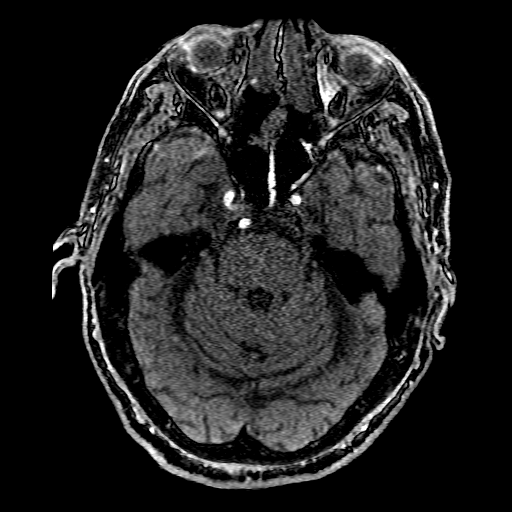
[im 61/136]
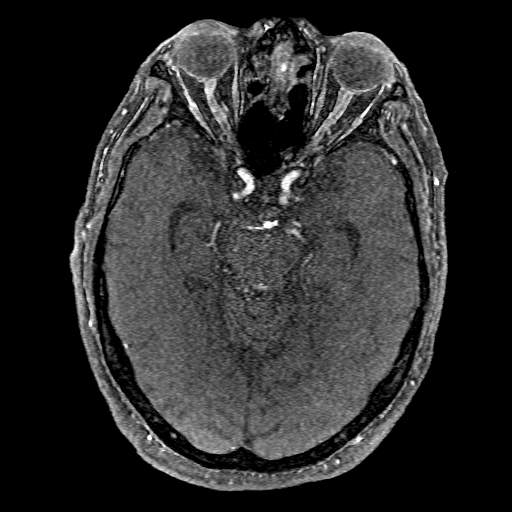
[im 69/136]
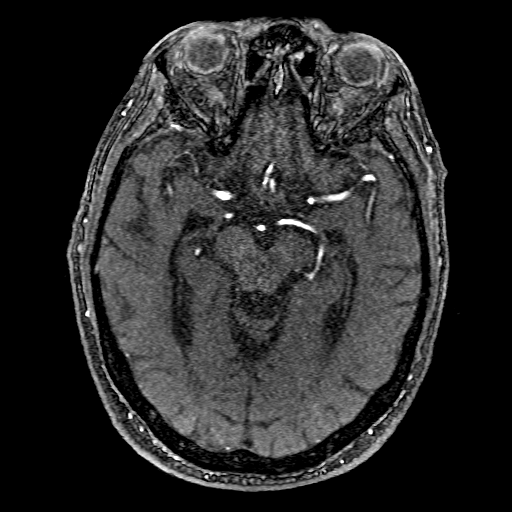
[im 78/136]
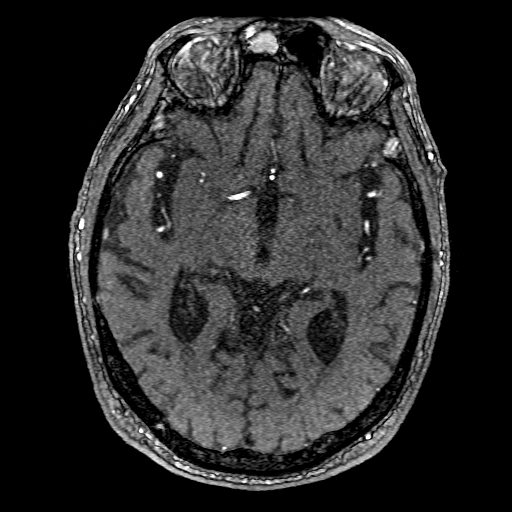
[im 95/136]
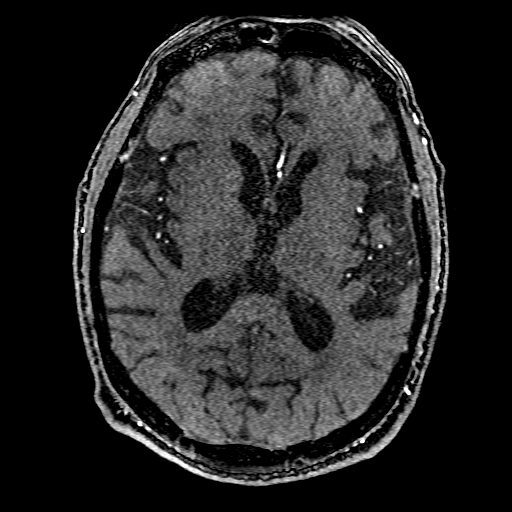
[im 113/136]
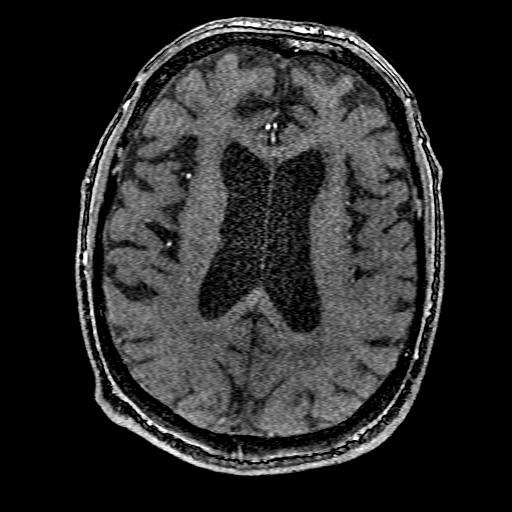
[im 115/136]
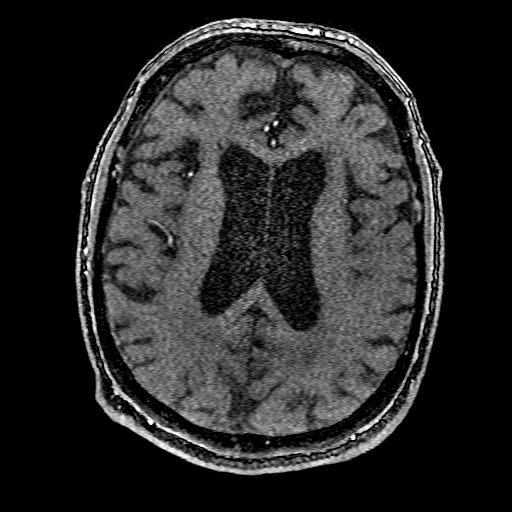
[im 130/136]
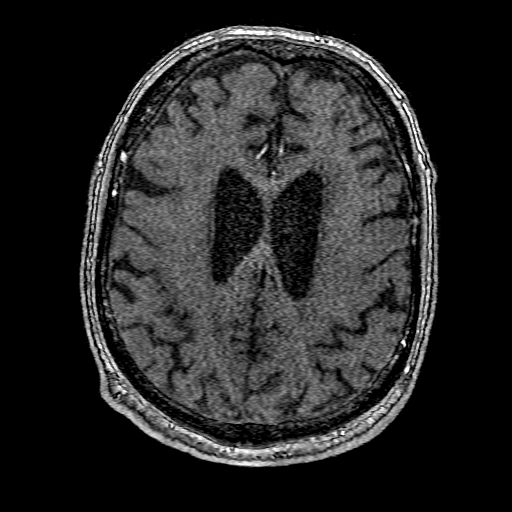

[20 of 48 positions shown; findings below may reference images not displayed]

FINDINGS: Both internal carotid arteries are widely patent into the brain. No
siphon stenosis. The anterior and middle cerebral vessels are patent
without proximal stenosis, aneurysm or vascular malformation.

Both vertebral arteries are widely patent to the basilar. No basilar
stenosis. Posterior circulation branch vessels appear normal.
IMPRESSION: Normal intracranial MR angiography of the large and medium size
vessels.

## 2021-04-03 IMAGING — CT CT ANGIO CHEST-ABD-PELV FOR DISSECTION W/ AND WO/W CM
2 of 7 series · 11 of 46 positions shown, 12 images · IV contrast (APPLIED)
Comparison: None.

CLINICAL DATA: 84-year-old male with chest and back pain, and lower
extremity weakness. Suspect aortic aneurysm and dissection

EXAM:
CT ANGIOGRAPHY CHEST, ABDOMEN AND PELVIS
TECHNIQUE: Non-contrast CT of the chest was initially obtained.

[Series 6: arterial · axial · arterial · 0.78mm/px · z∈[+920,+1502]mm · 8 of 341 slices shown, 9 images]
[im 25/341  soft-tissue]
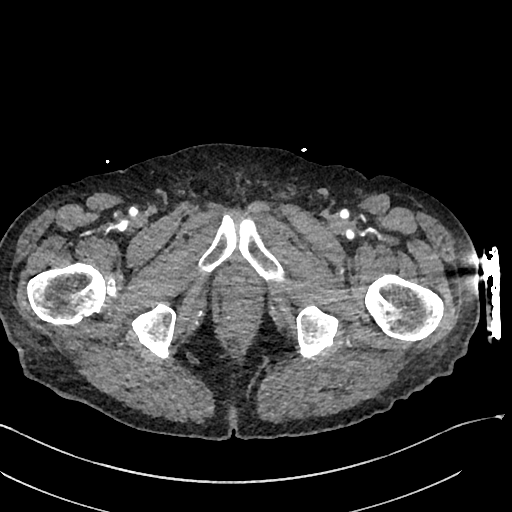
[im 25/341  bone]
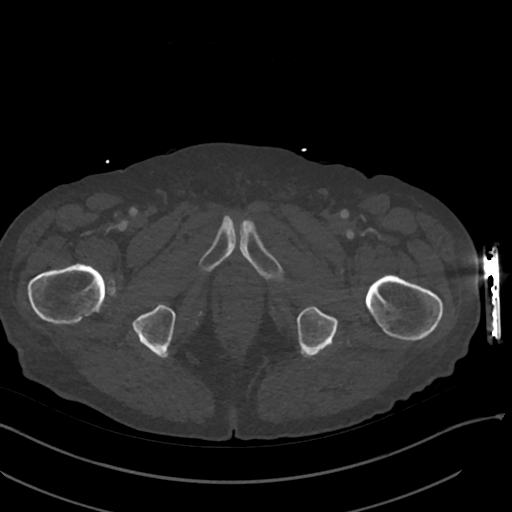
[im 73/341  soft-tissue]
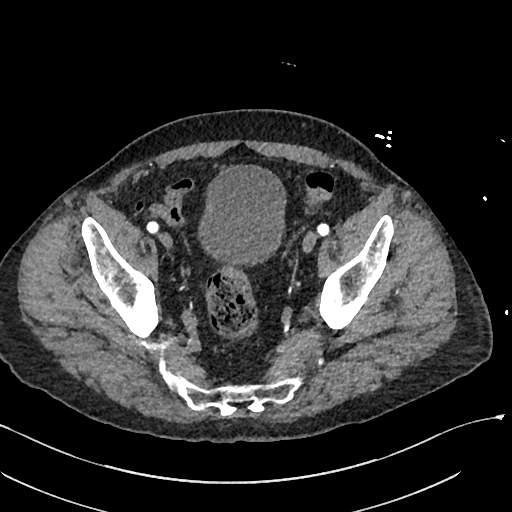
[im 122/341  soft-tissue]
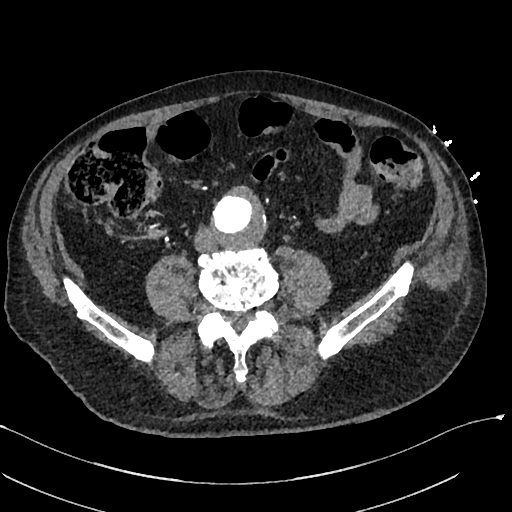
[im 146/341  soft-tissue]
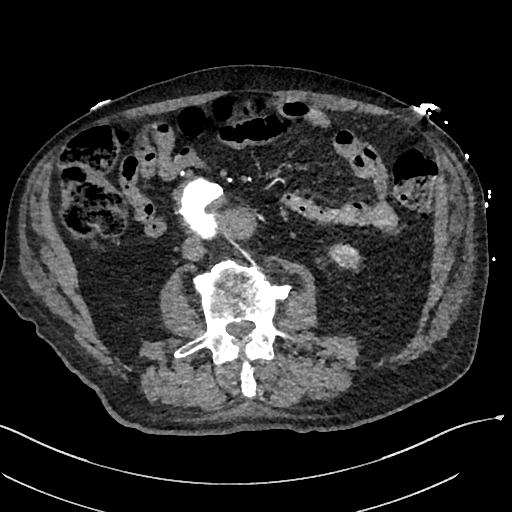
[im 195/341  soft-tissue]
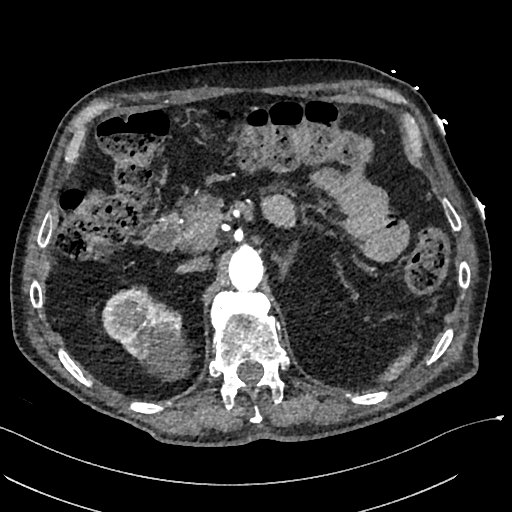
[im 219/341  soft-tissue]
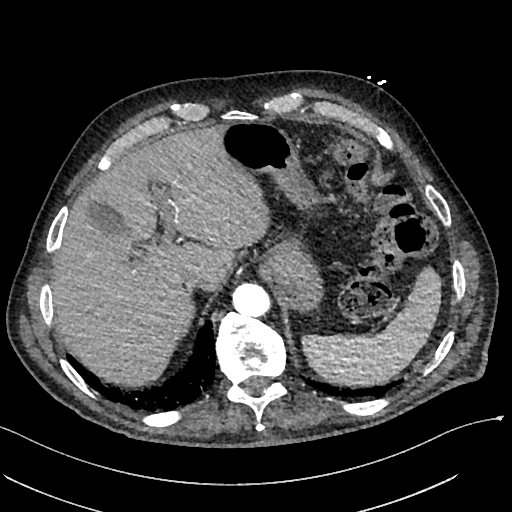
[im 268/341  soft-tissue]
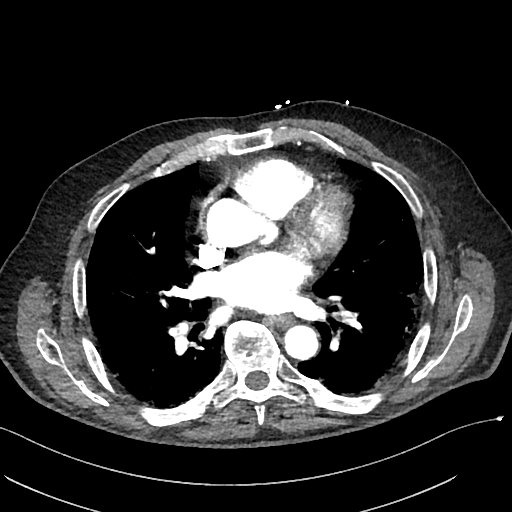
[im 316/341  soft-tissue]
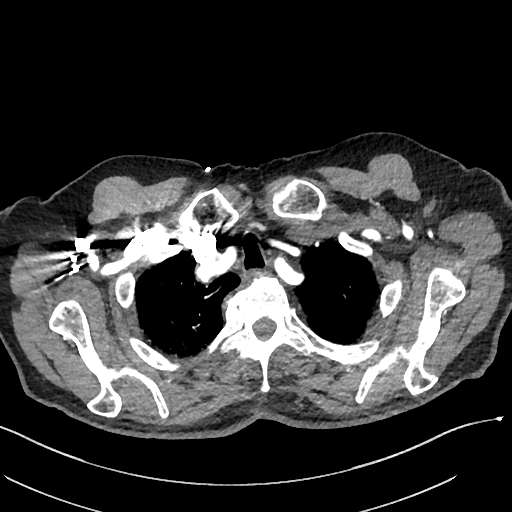

[Series 9: cor · coronal · 0.84mm/px · 3 of 149 slices shown]
[im 38/149  soft-tissue]
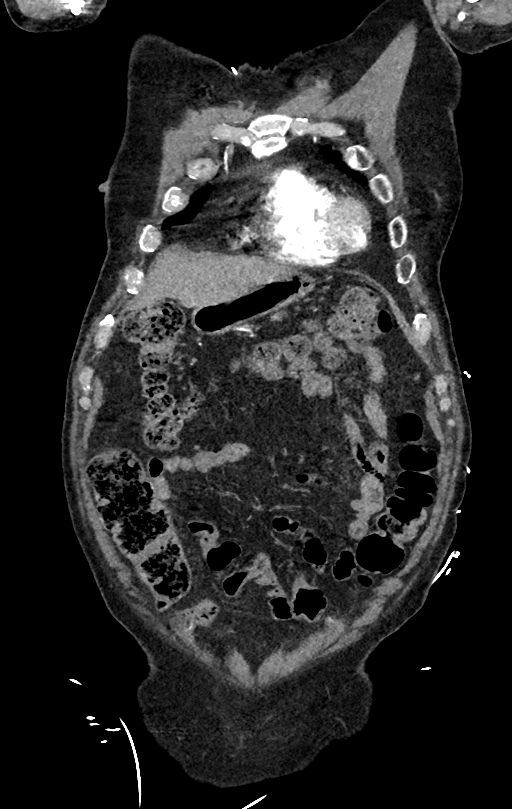
[im 75/149  soft-tissue]
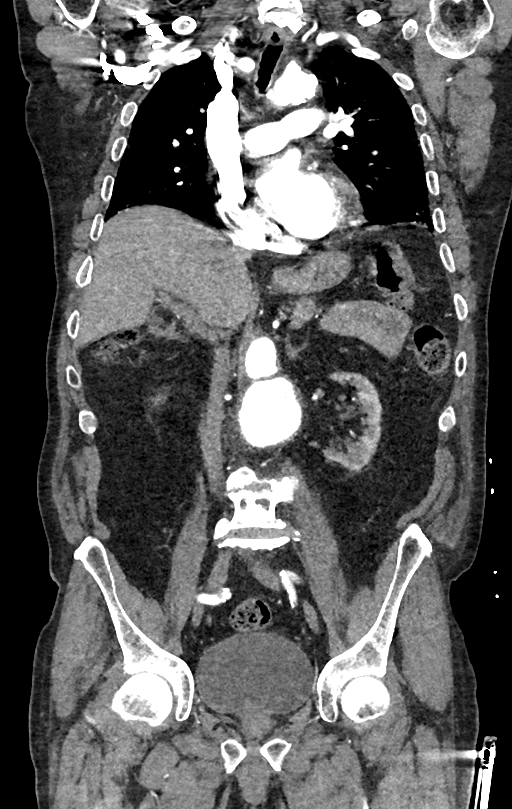
[im 112/149  soft-tissue]
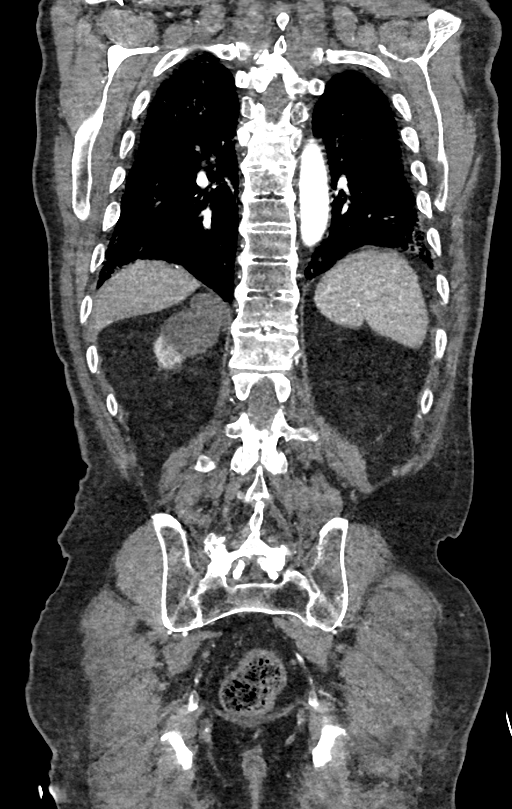

[11 of 46 positions shown; findings below may reference images not displayed]

Multidetector CT imaging through the chest, abdomen and pelvis was
performed using the standard protocol during bolus administration of
intravenous contrast. Multiplanar reconstructed images and MIPs were
obtained and reviewed to evaluate the vascular anatomy.

CONTRAST:  100mL OMNIPAQUE IOHEXOL 350 MG/ML SOLN
FINDINGS: CTA CHEST FINDINGS

Cardiovascular: No evidence of acute intramural hematoma on the
initial noncontrast enhanced images. The aortic root is normal in
caliber. There is significant motion artifact, but no definitive
abnormality. Mildly ectatic but nonaneurysmal ascending thoracic
aorta with a maximal diameter of 3.9 cm. No evidence of thoracic
aortic dissection. Conventional 2 vessel arch anatomy. Mild
elongation of the aortic isthmus and proximal descending thoracic
aorta. The heart is at the upper limits of normal for size.
Calcifications noted along the coronary arteries. No pericardial
effusion. Normal caliber main and central pulmonary arteries. No
central PE.

Mediastinum/Nodes: Unremarkable CT appearance of the thyroid gland.
No suspicious mediastinal or hilar adenopathy. No soft tissue
mediastinal mass. The thoracic esophagus is unremarkable.

Lungs/Pleura: Extensive respiratory motion artifact significantly
limits evaluation of the lungs for pulmonary nodules. Extensive
peripheral subpleural reticulation and architectural distortion
throughout the lungs most significant in the lower lobes. Assessment
for honeycomb in is limited by the respiratory motion artifact.
Overall, findings are consistent with pulmonary fibrosis. No
pneumothorax or pleural effusion.

Musculoskeletal: No acute fracture or aggressive appearing lytic or
blastic osseous lesion.

Review of the MIP images confirms the above findings.

CTA ABDOMEN AND PELVIS FINDINGS

VASCULAR

Aorta: Extremely tortuous abdominal aorta with a tri lobed
juxtarenal abdominal aortic aneurysm. At the level of the renal
arteries, the aorta makes an approximately 79 degree angle toward
the right for several cm before making a second 79 degree angle
inferiorly toward the bifurcation. This results in a zigzag
configuration of the aneurysmal abdominal aorta. The largest
aneurysmal segment is the most proximal juxtarenal portion of the
aneurysm which measures up to 6.2 cm in diameter (see coronal
reformatted image 69 of series 9). The more distal infrarenal
segment measures up to 4.5 cm in diameter. The aneurysm extends from
the origin of the right renal artery to the aortic bifurcation. No
associated inflammatory changes or retroperitoneal stranding to
suggest impending rupture.

Celiac: The common hepatic and splenic arteries have separate
origins from the abdominal aorta. A beaded appearance at the origin
of the splenic artery may represent underlying fibromuscular
dysplasia. No evidence of dissection, significant stenosis or
significant aneurysm.

SMA: Patent without evidence of aneurysm, dissection, vasculitis or
significant stenosis. The SMA arises at the level of the initial
aneurysmal portion of the abdominal aorta.

Renals: The right renal artery arises at the level of the SMA and at
the beginning of the aneurysmal segment of the abdominal aorta. The
artery is patent without evidence of dissection or fibromuscular
dysplasia. The left renal artery arises from the anterior aspect of
the most aneurysmal segment of the aorta and appears diffusely
diseased. There is a small accessory artery to the lower pole of the
left kidney arising from the distal aneurysmal segment of the aorta.

IMA: Patent.  Suspect at least mild stenosis at the origin.

Inflow: No evidence of dissection or aneurysm involving the iliac
arteries. Scattered atherosclerotic plaque. Both internal iliac
arteries remain widely patent. The external iliac arteries are
relatively spared from disease. The visualized common femoral,
superficial femoral and profunda femoral arteries are also
relatively spared from disease.

Veins: No focal venous abnormality.

Review of the MIP images confirms the above findings.

NON-VASCULAR

Hepatobiliary: No focal liver abnormality is seen. No gallstones,
gallbladder wall thickening, or biliary dilatation.

Pancreas: Unremarkable. No pancreatic ductal dilatation or
surrounding inflammatory changes.

Spleen: Normal in size without focal abnormality.

Adrenals/Urinary Tract: Unremarkable adrenal glands. No evidence of
hydronephrosis or nephrolithiasis. Mild atrophy of the left kidney
likely secondary to chronic arterial insufficiency. 4 cm simple cyst
exophytic from the posterior lower pole of the left kidney. 5.4 cm
simple cyst exophytic from the upper pole of the right kidney.
Unremarkable ureters and bladder.

Stomach/Bowel: No evidence of obstruction or focal bowel wall
thickening. Normal appendix in the right lower quadrant. The
terminal ileum is unremarkable.

Lymphatic: No suspicious lymphadenopathy.

Reproductive: Mild prostatomegaly.

Other: No abdominal wall hernia or abnormality. No abdominopelvic
ascites.

Musculoskeletal: No acute fracture or aggressive appearing lytic or
blastic osseous lesion. Multilevel degenerative disc disease.

Review of the MIP images confirms the above findings.
IMPRESSION: CTA CHEST

1. No evidence of thoracic aortic dissection or aneurysm.
2. The thoracic aorta is mildly ectatic but likely within normal
limits for age at 3.9 cm.
3. Coronary artery calcifications.
4. Very limited evaluation of the lungs due to extensive respiratory
motion artifact. However, CT findings suggest underlying pulmonary
fibrosis, perhaps related to interstitial lung disease such as usual
interstitial pneumonitis.

CTA ABD/PELVIS

1. Tri-lobed juxtarenal and infrarenal abdominal aortic aneurysm
with a maximal diameter of 6.2 cm. The aneurysmal segment of the
aorta is highly tortuous with a zigzag shaped including two nearly
80 degree angulations. No evidence of rupture/impending rupture at
this time. Recommend referral to a vascular specialist. This
recommendation follows ACR consensus guidelines: White Paper of the
ACR Incidental Findings Committee II on Vascular Findings. [HOSPITAL] [Y1]; [DATE].
2. The right renal artery and SMA arise at the beginning of the
aneurysmal segment while the left renal arteries arise from the
aneurysmal aorta.
3. Mild left renal atrophy likely related to chronic ischemia from
renal artery stenosis.
4. Bilateral renal cysts.
5. Prostatomegaly.

## 2021-04-03 MED ORDER — ATORVASTATIN CALCIUM 10 MG PO TABS: 20.0000 mg | ORAL_TABLET | Freq: Every day | ORAL | Status: DC

## 2021-04-03 MED ORDER — STROKE: EARLY STAGES OF RECOVERY BOOK
Freq: Once | Status: AC
  Filled 2021-04-03: qty 1

## 2021-04-03 MED ORDER — HYDROCORTISONE NA SUCCINATE PF 250 MG IJ SOLR
200.0000 mg | Freq: Once | INTRAMUSCULAR | Status: AC
  Administered 2021-04-03: 200 mg via INTRAVENOUS
  Filled 2021-04-03: qty 200

## 2021-04-03 MED ORDER — MECLIZINE HCL 12.5 MG PO TABS: 12.5000 mg | ORAL_TABLET | Freq: Three times a day (TID) | ORAL | Status: DC | PRN

## 2021-04-03 MED ORDER — ONDANSETRON HCL 4 MG/2ML IJ SOLN: 4.0000 mg | Freq: Four times a day (QID) | INTRAMUSCULAR | Status: DC | PRN

## 2021-04-03 MED ORDER — HALOPERIDOL LACTATE 5 MG/ML IJ SOLN
2.0000 mg | Freq: Four times a day (QID) | INTRAMUSCULAR | Status: DC | PRN
  Administered 2021-04-03 – 2021-04-05 (×2): 2 mg via INTRAVENOUS
  Filled 2021-04-03 (×2): qty 1

## 2021-04-03 MED ORDER — DIPHENHYDRAMINE HCL 25 MG PO CAPS: 50.0000 mg | ORAL_CAPSULE | Freq: Once | ORAL | Status: AC

## 2021-04-03 MED ORDER — ASPIRIN EC 325 MG PO TBEC
325.0000 mg | DELAYED_RELEASE_TABLET | Freq: Every day | ORAL | Status: DC
  Administered 2021-04-03: 325 mg via ORAL
  Filled 2021-04-03: qty 1

## 2021-04-03 MED ORDER — CLOPIDOGREL BISULFATE 75 MG PO TABS
75.0000 mg | ORAL_TABLET | Freq: Every day | ORAL | Status: DC
  Administered 2021-04-04 – 2021-04-06 (×3): 75 mg via ORAL
  Filled 2021-04-03 (×3): qty 1

## 2021-04-03 MED ORDER — ASPIRIN 300 MG RE SUPP: 300.0000 mg | Freq: Every day | RECTAL | Status: DC

## 2021-04-03 MED ORDER — IOHEXOL 350 MG/ML SOLN
100.0000 mL | Freq: Once | INTRAVENOUS | Status: AC | PRN
  Administered 2021-04-03: 100 mL via INTRAVENOUS

## 2021-04-03 MED ORDER — ENOXAPARIN SODIUM 40 MG/0.4ML ~~LOC~~ SOLN
40.0000 mg | SUBCUTANEOUS | Status: DC
  Administered 2021-04-03 – 2021-04-06 (×4): 40 mg via SUBCUTANEOUS
  Filled 2021-04-03 (×4): qty 0.4

## 2021-04-03 MED ORDER — SODIUM CHLORIDE 0.9 % IV SOLN: INTRAVENOUS | Status: DC

## 2021-04-03 MED ORDER — ASPIRIN EC 81 MG PO TBEC
81.0000 mg | DELAYED_RELEASE_TABLET | Freq: Every day | ORAL | Status: DC
  Administered 2021-04-04 – 2021-04-06 (×3): 81 mg via ORAL
  Filled 2021-04-03 (×3): qty 1

## 2021-04-03 MED ORDER — DIPHENHYDRAMINE HCL 50 MG/ML IJ SOLN
50.0000 mg | Freq: Once | INTRAMUSCULAR | Status: AC
  Administered 2021-04-03: 50 mg via INTRAVENOUS
  Filled 2021-04-03: qty 1

## 2021-04-03 MED ORDER — ATORVASTATIN CALCIUM 40 MG PO TABS
40.0000 mg | ORAL_TABLET | Freq: Every day | ORAL | Status: DC
  Administered 2021-04-03 – 2021-04-05 (×3): 40 mg via ORAL
  Filled 2021-04-03 (×3): qty 1

## 2021-04-03 NOTE — Consult Note (Signed)
Neurology Consultation Reason for Consult: Stroke on MRI Requesting Physician: Thayer Ohm Tegeler  CC: Difficulty getting up  History is obtained from: Patient and chart review  HPI: Maxwell Rollins is a 85 y.o. male with a past medical history per [facility] of hyperlipidemia, hypertension (patient denies this and reports he is not on any antihypertensive medications), former smoking, numbness in the feet, vertigo, stage III Chronic kidney disease, dementia (which patient denies).  History is significantly limited by the fact that family was not at bedside or reachable by phone for me, and patient is intermittently a bit confused (initially reports the year is 2020, asks me to open the door and is surprised to find that we are in the hospital, telling me he thought that we were in his front yard).  Within this limitation, patient reports that he was in his usual state of health today, had gone to doctor's appointment, and had gone out to eat afterwards.  He then could not get up out of a chair due to feeling weak in both of his legs.  Unclear if this was associated with numbness.  He stated to me and to another ED provider that his weakness was improving but persistent weakness was pointed on examination he reported that this was the problem that he had complained about and that he does not feel it has gotten any better.  He reports that he was in fairly good health until he contracted COVID-19 in January 2021 for which he was hospitalized for 3 to 4 weeks.  Subsequent to that he has had chronic dizziness, often associated with ambulation but not necessarily position change.  Per chart review he has been on meclizine as needed, possibly for this issue  Patient denies any recent signs or symptoms of infection, vision changes, speech issues, chest pain, nausea or vomiting.  Additionally he will not state the etiology of significant scratches on his right knee and shin stating it is "none  of your business"  LKW: Unclear tPA given?: No, due to out of the window  Premorbid modified rankin scale: Unclear given lack of collateral     0 - No symptoms.     1 - No significant disability. Able to carry out all usual activities, despite some symptoms.     2 - Slight disability. Able to look after own affairs without assistance, but unable to carry out all previous activities.     3 - Moderate disability. Requires some help, but able to walk unassisted.     4 - Moderately severe disability. Unable to attend to own bodily needs without assistance, and unable to walk unassisted.     5 - Severe disability. Requires constant nursing care and attention, bedridden, incontinent.     6 - Dead.  ROS: All other review of systems was negative except as noted in the HPI, though notable limitation of confusion  Past Medical History:  Diagnosis Date  . Abnormal serum cholesterol    Current Outpatient Medications  Medication Instructions  . atorvastatin (LIPITOR) 20 mg, Oral, Daily at bedtime  . meclizine (ANTIVERT) 12.5 mg, Oral, 3 times daily PRN  . Pyridoxine HCl (VITAMIN B-6 PO) 1 tablet, Oral, Daily  . vitamin B-12 (CYANOCOBALAMIN) 1,000 mg, Oral, Daily  . vitamin C (ASCORBIC ACID) 500 mg, Oral, Daily  . Vitamin D (Ergocalciferol) (DRISDOL) 500,000 Units, Oral, Every Mon     No family history on file. He denies any family history of aneurysms, stroke, hypertension or diabetes  Social History:  reports that he has never smoked. He has never used smokeless tobacco. He reports that he does not drink alcohol and does not use drugs. To me he reports he was smoked up to 4 packs/day but quit 27 years ago, and he is reluctant to quantify how many years he did smoke stating that it was "on and off"  Exam: Current vital signs: BP (!) 157/93   Pulse 76   Temp 98.3 F (36.8 C) (Oral)   Resp 15   Ht 6\' 2"  (1.88 m)   Wt 92.1 kg   SpO2 96%   BMI 26.06 kg/m  Vital signs in last 24  hours: Temp:  [98.3 F (36.8 C)-98.6 F (37 C)] 98.3 F (36.8 C) (04/04 2203) Pulse Rate:  [58-77] 76 (04/05 0030) Resp:  [14-19] 15 (04/05 0030) BP: (110-165)/(68-100) 157/93 (04/05 0030) SpO2:  [95 %-99 %] 96 % (04/05 0030) Weight:  [92.1 kg] 92.1 kg (04/04 1824)   Physical Exam  Constitutional: Appears well-developed and well-nourished.  Psych: Affect appropriate to situation, intermittently mildly suspicious but redirectable Eyes: No scleral injection HENT: No oropharyngeal obstruction.  MSK: no joint deformities.  Cardiovascular: Normal rate and regular rhythm.  Respiratory: Effort normal, non-labored breathing GI: Soft.  No distension. There is no tenderness.  Skin: Healing abrasion of the right knee and anterior shin  Neuro: Mental Status: Patient is awake, alert, oriented to person, place, month, year (initially reports 2020 but then corrects to 2022 when I state "close"), and intermittently to situation as described in HPI above Patient is able to give a limited history Has some mild difficulty with naming for example reporting that the face of the watch is "a glass" or lens Cranial Nerves: II: Visual Fields are full. Pupils are equal, round, and reactive to light 3 to 2 mm III,IV, VI: EOMI without ptosis or diploplia though he does have saccadic pursuits V: Facial sensation is symmetric to temperature and light touch VII: Facial movement is notable for mild right nasolabial fold flattening which she reports is baseline VIII: hearing is intact to voice X: Uvula elevates symmetrically XI: Shoulder shrug is symmetric. XII: tongue is midline without atrophy or fasciculations.  Motor: Tone is normal. Bulk is normal. 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  Positive jaw jerk Plantars: Toes are mute bilaterally.  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  Suspect this is likely due to the neuroforamina narrowing in multiple areas of his lumbar spine and will improve with conservative measures.  However incidentally found left temporal stroke merits full stroke work-up to minimize risk of further strokes  Recommendations: -Vitamin B12 level, supplement if lower than 400  #Punctate left temporal stroke - Stroke labs TSH, HgbA1c, fasting lipid panel - Frequent neuro checks - Echocardiogram - Prophylactic therapy-Antiplatelet med: Aspirin - dose 325mg  PO or 300mg  PR, followed by 81 mg daily - Consider Plavix 300 mg load with 75 mg daily for 21 - 90 day course of indication for anticoagulation determined - Risk factor modification - Telemetry monitoring; 30 day event monitor on discharge if no arrythmias captured  - Blood pressure goal   - Permissive hypertension to 220/120 due to LVO; transition today (4/5)  - PT consult, OT  consult, Speech consult, unless patient is back to baseline - Stroke team to follow  #Mild confusion -B12 level to check for reversible causes of dementia in addition to stroke labs above -We will need clarification with family when able  MD-PhD [facility] [phone] Available 7 PM to 7 AM, outside of these hours please call Neurologist on call as listed on Amion.

## 2021-04-03 NOTE — ED Notes (Signed)
Pt returned from MRI, vascular surgery at bedside

## 2021-04-03 NOTE — ED Notes (Signed)
Patient transported to MRI 

## 2021-04-03 NOTE — ED Notes (Signed)
Patient transported to CT 

## 2021-04-03 NOTE — ED Notes (Signed)
Pt given snack and beverage. 
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
## 2021-04-03 NOTE — ED Notes (Signed)
Attempted to give reportx1 

## 2021-04-03 NOTE — ED Notes (Signed)
Pt transported to MRI 

## 2021-04-03 NOTE — ED Provider Notes (Signed)
Patient signed out pending CT angio and neurology recommendations.  Per neurology consult, recommend further imaging and stroke work-up.  Feel that his acute stroke may be incidental and that his weakness may be more related to foraminal narrowing of the lumbar spine.  Patient incidentally noted to have aneurysm on MRI.  He required pretreatment for CT scan.  CT scan reviewed by myself.  Radiology review with torturous greater than 5 cm aneurysm of the abdomen.  No obvious rupture.  We will plan for routine vascular surgery consultation.  Unclear whether this is at all contributing to acute presentation.  Regardless will need vascular input regarding both future repair and/or antiplatelet therapy/anticoagulation for stroke prophylaxis.   Physical Exam  BP (!) 159/89   Pulse 70   Temp 98.3 F (36.8 C) (Oral)   Resp 14   Ht 1.88 m (6\' 2" )   Wt 92.1 kg   SpO2 93%   BMI 26.06 kg/m       , MD 04/03/21 [phone]

## 2021-04-03 NOTE — Consult Note (Addendum)
Hospital Consult    Reason for Consult:  AAA Requesting Physician:  Jonah Blue, MD MRN #:  [id number]  History of Present Illness: This is a 85 y.o. male presents to ED onset of lower extremity weakness. 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  The patient is interviewed and examined in the ED where he is alert and in NAD.  He states he was in his usual state of health when he attempted to rise from a sitting position yesterday and was unable to get to his feet. This was associated with a feeling of passing out, but he did not loss conciousness.  He says he was in no pain and denied monocular blindness, slurred speech, or upper extremity weakness. He denies claudication and plays golf three times a week. He sometimes has balance problems while playing. Currently in no pain and has not ambulated.   Surgical history significant for multiple nasal surgeries.  No history of hypertension, coronary disease or diabetes.  Former smoker; quit approximately 20 years ago.  Past Medical History:  Diagnosis Date  . Abnormal serum cholesterol      Allergies  Allergen Reactions  . Contrast Media [Iodinated Diagnostic Agents]     Other reaction(s): rash/itching  . Red Dye     Prior to Admission medications   Medication Sig Start Date End Date Taking? Authorizing Provider  atorvastatin (LIPITOR) 20 MG tablet Take 20 mg by mouth at bedtime. 11/22/20  Yes [provider]  meclizine (ANTIVERT) 25 MG tablet Take 12.5 mg by mouth 3 (three) times daily as needed for dizziness or nausea. 01/31/21  Yes [provider]  Pyridoxine HCl (VITAMIN B-6 PO) Take 1 tablet by mouth daily. 01/31/21  Yes [provider]  vitamin B-12 (CYANOCOBALAMIN) 1000 MCG tablet Take 1,000 mg by mouth daily. 03/04/18  Yes [provider]  vitamin C (ASCORBIC ACID) 500 MG tablet Take 500 mg by mouth daily. 03/04/18  Yes [provider]  Vitamin D, Ergocalciferol, (DRISDOL) 1.25 MG (50000 UNIT) CAPS capsule Take 500,000 Units by mouth every Monday. 01/31/21  Yes [provider]    Social History   Socioeconomic History  . Marital status: Married    Spouse name: Not on file  . Number of children: Not on file  . Years of education: Not on file  . Highest education level: Not on file  Occupational History  . Not on file  Tobacco Use  . Smoking status: Never Smoker  . Smokeless tobacco: Never Used  Substance and Sexual Activity  . Alcohol use: Never  . Drug use: Never  . Sexual activity: Not on file  Other Topics Concern  . Not on file  Social History Narrative  . Not on file   Social Determinants of Health   Financial Resource Strain: Not on file  Food Insecurity: Not on file  Transportation Needs: Not on file  Physical Activity: Not on file  Stress: Not on file  Social Connections: Not on file  Intimate Partner Violence: Not on file     No family history on file.  ROS: Otherwise negative unless mentioned in HPI  Physical Examination  Vitals:   04/03/21 0745 04/03/21 0800  BP: (!) 158/146 (!) 125/105  Pulse: 92 100  Resp: (!) 21   Temp:    SpO2: 94% 92%   Body mass index is 26.06 kg/m.  General:  WDWN in NAD Gait: Not observed HENT: WNL,  normocephalic Pulmonary: normal non-labored breathing, without Rales, rhonchi,  wheezing Cardiac: regular, without  Murmurs, rubs or gallops; without carotid bruits Abdomen:  soft, NT/ND, no pulsitle mass palpated Skin: without rashes Vascular Exam/Pulses: 2+ bilateral brachial, radial, femoral, popliteal and PT pulses. 2+ left DP pulse. Extremities: without ischemic changes, without Gangrene , without cellulitis; without open wounds;  Musculoskeletal: no muscle wasting or atrophy. 5/5 grip , biceps and plantar flexion strength  Neurologic: A&O X 3;  No focal weakness or paresthesias are detected; speech is fluent/normal Psychiatric:  The pt  has Normal affect. Lymph:  Unremarkable  CBC    Component Value Date/Time   WBC 8.2 04/02/2021 1848   RBC 4.75 04/02/2021 1848   HGB 15.0 04/02/2021 1852   HCT 44.0 04/02/2021 1852   PLT 146 (L) 04/02/2021 1848   MCV 91.4 04/02/2021 1848   MCH 29.7 04/02/2021 1848   MCHC 32.5 04/02/2021 1848   RDW 15.2 04/02/2021 1848   LYMPHSABS 1.9 04/02/2021 1848   MONOABS 0.8 04/02/2021 1848   EOSABS 0.5 04/02/2021 1848   BASOSABS 0.1 04/02/2021 1848    BMET    Component Value Date/Time   NA 140 04/02/2021 1852   K 4.2 04/02/2021 1852   CL 103 04/02/2021 1852   CO2 27 04/02/2021 1848   GLUCOSE 100 (H) 04/02/2021 1852   BUN 19 04/02/2021 1852   CREATININE 1.50 (H) 04/02/2021 1852   CALCIUM 8.6 (L) 04/02/2021 1848   GFRNONAA 47 (L) 04/02/2021 1848    COAGS: Lab Results  Component Value Date   INR 1.1 04/02/2021   CTA chest/abd/pelvis 04/03/2021:  Aorta: Extremely tortuous abdominal aorta with a tri lobed juxtarenal abdominal aortic aneurysm. At the level of the renal arteries, the aorta makes an approximately 79 degree angle toward the right for several cm before making a second 79 degree angle inferiorly toward the bifurcation. This results in a zigzag configuration of the aneurysmal abdominal aorta. The largest aneurysmal segment is the most proximal juxtarenal portion of the aneurysm which measures up to 6.2 cm in diameter (see coronal reformatted image 69 of series 9). The more distal infrarenal segment measures up to 4.5 cm in diameter. The aneurysm extends from the origin of the right renal artery to the aortic bifurcation. No associated inflammatory changes or retroperitoneal stranding to suggest impending rupture.  Celiac: The common hepatic and splenic arteries have separate origins from the abdominal aorta. A beaded appearance at the origin of the splenic artery may represent underlying fibromuscular dysplasia. No evidence of dissection, significant stenosis  or significant aneurysm.  SMA: Patent without evidence of aneurysm, dissection, vasculitis or significant stenosis. The SMA arises at the level of the initial aneurysmal portion of the abdominal aorta.  Renals: The right renal artery arises at the level of the SMA and at the beginning of the aneurysmal segment of the abdominal aorta. The artery is patent without evidence of dissection or fibromuscular dysplasia. The left renal artery arises from the anterior aspect of the most aneurysmal segment of the aorta and appears diffusely diseased. There is a small accessory artery to the lower pole of the left kidney arising from the distal aneurysmal segment of the aorta.  IMA: Patent.  Suspect at least mild stenosis at the origin.  Inflow: No evidence of dissection or aneurysm involving the iliac arteries. Scattered atherosclerotic plaque. Both internal iliac arteries remain widely patent. The external iliac arteries are relatively spared from disease. The visualized common femoral, superficial femoral and profunda femoral arteries  are also relatively spared from disease.  Veins: No focal venous abnormality.  MRA head: FINDINGS: Both internal carotid arteries are widely patent into the brain. No siphon stenosis. The anterior and middle cerebral vessels are patent without proximal stenosis, aneurysm or vascular malformation.  Both vertebral arteries are widely patent to the basilar. No basilar stenosis. Posterior circulation branch vessels appear normal.  IMPRESSION: Normal intracranial MR angiography of the large and medium size vessels.  Statin:  Yes.   Beta Blocker:  No. Aspirin:  Yes.  (started in ED) ACEI:  No. ARB:  No. CCB use:  No Other antiplatelets/anticoagulants:  No.    ASSESSMENT/PLAN: This is a 85 y.o. 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  No symptoms referable to his aneurysm.  Hemodynamically stable. Asprin therapy initiated. Dr. Arbie Cookey, vascular surgeon  on-call, will evaluate the patient and provide further recommendations.  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 Dr. Roda Shutters evaluating patient presently. Patient currently at neurologic baseline.   Milinda Antis PA-C [facility] [phone] 04/03/2021  8:53 AM    I have examined the patient, reviewed and agree with above.  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  Unclear as to the etiology of his presenting symptom of weakness which is being evaluated.  Patient is being admitted to medicine service.  Discussed the significance of his 6.2 cm juxtarenal aneurysm.  Explained that this is potentially dangerous and will require further consideration of elective repair.  His aneurysm begins at the level of the superior mesenteric artery and involves both kidneys.  We will follow along.  Will need eventual referral to [facility] for consideration of off label stent graft treatment with Dr. Pattricia Boss or potential open aneurysm repair  Gretta Began, MD 04/03/2021 11:24 AM

## 2021-04-03 NOTE — ED Notes (Signed)
Pt was found in hallway naked, trying to leave ED. Pt was confused and could not be re oriented to surroundings. Pt did agitated with staff. Dr Ophelia Charter aware

## 2021-04-03 NOTE — CV Procedure (Signed)
BLE venous duplex and carotid completed.  Results can be found under chart review under CV PROC. 04/03/2021 4:30 PM Abigial Newville RVT, RDMS

## 2021-04-03 NOTE — Plan of Care (Signed)
  Problem: Education: Goal: Knowledge of General Education information will improve Description: Including pain rating scale, medication(s)/side effects and non-pharmacologic comfort measures Outcome: Progressing   Problem: Health Behavior/Discharge Planning: Goal: Ability to manage health-related needs will improve Outcome: Progressing   Problem: Clinical Measurements: Goal: Ability to maintain clinical measurements within normal limits will improve Outcome: Progressing Goal: Will remain free from infection Outcome: Progressing Goal: Diagnostic test results will improve Outcome: Progressing Goal: Respiratory complications will improve Outcome: Progressing Goal: Cardiovascular complication will be avoided Outcome: Progressing   Problem: Activity: Goal: Risk for activity intolerance will decrease Outcome: Progressing   Problem: Safety: Goal: Ability to remain free from injury will improve Outcome: Progressing   Problem: Pain Managment: Goal: General experience of comfort will improve Outcome: Progressing   Problem: Skin Integrity: Goal: Risk for impaired skin integrity will decrease Outcome: Progressing   Problem: Education: Goal: Knowledge of disease or condition will improve Outcome: Progressing Goal: Knowledge of secondary prevention will improve Outcome: Progressing Goal: Knowledge of patient specific risk factors addressed and post discharge goals established will improve Outcome: Progressing   Problem: Health Behavior/Discharge Planning: Goal: Ability to manage health-related needs will improve Outcome: Progressing

## 2021-04-03 NOTE — Progress Notes (Addendum)
STROKE TEAM PROGRESS NOTE  Brief HPI: Maxwell Rollins is a 85 y.o. male with a past medical history per [facility] of hyperlipidemia, hypertension (patient denies this and reports he is not on any antihypertensive medications), former smoking, numbness in the feet, vertigo, stage III Chronic kidney disease, dementia (which patient denies). presenting with bilateral lower extremity weakness.  Patient reports that he and his wife went out to lunch after his doctor's appt. Yesterday, and after he ate his sandwich the patient reported that the attempted to stand up, but realized that he was having a difficult time doing so. Patient reports that he felt "light-headed" and thought he was going to fall to the floor. Patient reports that he had to be assisted to standing position by his wife and another woman came to help her and were able to get him to the care. Patient reports that when they got home family had to wheel him into the house with a wheelchair. Patient reports that he told his wife that he still felt "unwell" and they decided to go to the hospital. Patient reports that he did take 2, 81mg  ASA prior to coming to [facility], but he had not been taking the ASA daily. On exam today patient reports that he still has the "light headed" feeling today, but only when he is upright. Patient reports that lying in the bed nearly completely flat he does not feel abnormal; however patient reports that if he were to be asked to get up he does not think he would be able to make it to the door from his bed and reports that he believes his extremities continue to feel weak. Patient denies any pain.   Vitals:   04/03/21 0545 04/03/21 0600 04/03/21 0615 04/03/21 0645  BP: (!) 170/94 (!) 147/109 (!) 159/89 131/89  Pulse: 75 72 70 67  Resp: 15 14 14 14   Temp:      TempSrc:      SpO2: 96% 93% 93% 92%  Weight:      Height:       CBC:  Recent Labs  Lab 04/02/21 1848 04/02/21 1852  WBC 8.2  --    NEUTROABS 4.9  --   HGB 14.1 15.0  HCT 43.4 44.0  MCV 91.4  --   PLT 146*  --    Basic Metabolic Panel:  Recent Labs  Lab 04/02/21 1848 04/02/21 1852  NA 140 140  K 4.2 4.2  CL 106 103  CO2 27  --   GLUCOSE 105* 100*  BUN 19 19  CREATININE 1.49* 1.50*  CALCIUM 8.6*  --    Lipid Panel:  Recent Labs  Lab 04/03/21 0418  CHOL 169  TRIG 67  HDL 46  CHOLHDL 3.7  VLDL 13  LDLCALC 06/02/21*   HgbA1c:  Recent Labs  Lab 04/03/21 0418  HGBA1C 5.8*   Urine Drug Screen:  Recent Labs  Lab 04/03/21 0202  LABOPIA NONE DETECTED  COCAINSCRNUR NONE DETECTED  LABBENZ NONE DETECTED  AMPHETMU NONE DETECTED  THCU NONE DETECTED  LABBARB NONE DETECTED    Alcohol Level  Recent Labs  Lab 04/02/21 1848  ETH <10    IMAGING past 24 hours CT HEAD WO CONTRAST  Result Date: 04/02/2021 CLINICAL DATA:  Acute neurological deficit with bilateral leg weakness and numbness beginning this afternoon. EXAM: CT HEAD WITHOUT CONTRAST TECHNIQUE: Contiguous axial images were obtained from the base of the skull through the vertex without intravenous contrast. COMPARISON:  None. FINDINGS:  Brain: Diffuse cerebral atrophy. Ventricular dilatation consistent with central atrophy. Low-attenuation changes in the deep white matter consistent with small vessel ischemia. No abnormal extra-axial fluid collections. No mass effect or midline shift. Gray-white matter junctions are distinct. Basal cisterns are not effaced. No acute intracranial hemorrhage. Vascular: No hyperdense vessel or unexpected calcification. Skull: Normal. Negative for fracture or focal lesion. Sinuses/Orbits: Postoperative changes in the maxillary antra and ethmoid air cells. Mild mucosal thickening in the paranasal sinuses. No acute air-fluid levels. Mastoid air cells are clear. Other: None. IMPRESSION: 1. No acute intracranial abnormalities. 2. Chronic atrophy and small vessel ischemia. Electronically Signed   By: Burman Nieves M.D.   On:  04/02/2021 19:15   MR BRAIN WO CONTRAST  Result Date: 04/03/2021 CLINICAL DATA:  Initial evaluation for lower extremity numbness. EXAM: MRI HEAD WITHOUT CONTRAST TECHNIQUE: Multiplanar, multiecho pulse sequences of the brain and surrounding structures were obtained without intravenous contrast. COMPARISON:  Prior head CT from earlier the same day. FINDINGS: Brain: Moderately advanced age-related cerebral atrophy. Patchy T2/FLAIR hyperintensity within the periventricular and deep white matter both cerebral hemispheres most consistent with chronic small vessel ischemic disease, mild for age. Single punctate 5 mm focus of diffusion abnormality seen involving the mesial left temporal lobe, consistent with a small acute ischemic infarct (series 2, image 21). No associated hemorrhage or mass effect. No other diffusion abnormality to suggest acute or subacute ischemia. Gray-white matter differentiation otherwise maintained. No encephalomalacia to suggest chronic cortical infarction elsewhere within the brain. No other evidence for acute or chronic intracranial hemorrhage. No mass lesion, midline shift or mass effect. Mild ventricular prominence related to global parenchymal volume loss without hydrocephalus. No extra-axial fluid collection. Pituitary gland suprasellar region normal. Midline structures intact. Vascular: Major intracranial vascular flow voids are maintained. Skull and upper cervical spine: Craniocervical junction within normal limits. Bone marrow signal intensity normal. No scalp soft tissue abnormality. Sinuses/Orbits: Patient status post bilateral ocular lens replacement. Globes and orbital soft tissues demonstrate no acute finding. Chronic frontoethmoidal sinusitis noted. Mastoid air cells are clear. Inner ear structures grossly normal. Other: None. IMPRESSION: 1. 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 2. No other acute intracranial  abnormality. 3. Moderately advanced age-related cerebral atrophy with mild chronic small vessel ischemic disease. 4. Chronic frontoethmoidal sinusitis. Electronically Signed   By: Rise Mu M.D.   On: 04/03/2021 00:18   MR LUMBAR SPINE WO CONTRAST  Result Date: 04/03/2021 CLINICAL DATA:  Initial evaluation for low back pain, cauda equina syndrome suspected. EXAM: MRI LUMBAR SPINE WITHOUT CONTRAST TECHNIQUE: Multiplanar, multisequence MR imaging of the lumbar spine was performed. No intravenous contrast was administered. COMPARISON:  None available. FINDINGS: Segmentation: Standard. Lowest well-formed disc space labeled the L5-S1 level. Alignment: 4 mm anterolisthesis of L5 on S1, with trace 2 mm retrolisthesis of L4 on L5. Findings chronic and facet mediated. Alignment otherwise normal with preservation of the normal lumbar lordosis. Vertebrae: Vertebral body height maintained without acute or chronic fracture. Bone marrow signal intensity diffusely heterogeneous without discrete or worrisome osseous lesion. No abnormal marrow edema. Conus medullaris and cauda equina: Conus extends to the T12-L1 level. Conus and cauda equina appear normal. Paraspinal and other soft tissues: Paraspinous soft tissues demonstrate no acute finding. Multiple scattered benign appearing cyst noted within the partially visualized kidneys, largest of which measures 4.1 cm on the left. A large irregular multilobulated aneurysm involving the intra-abdominal aorta partially visualized, measuring up to at least 5.3 cm in  AP diameter (series 11, image 12). Disc levels: T12-L1: Disc desiccation without significant disc bulge. Anterior endplate osteophytic spurring. No spinal stenosis. Foramina remain patent. L1-2: Disc desiccation with mild disc bulge. Anterior endplate osteophytic spurring. Mild facet and ligament flavum hypertrophy. No significant spinal stenosis. Foramina remain patent. L2-3: Diffuse disc bulge with disc  desiccation. Reactive endplate spurring. Mild facet and ligament flavum hypertrophy. No significant spinal stenosis. Foramina remain patent. L3-4: Advanced degenerative intervertebral disc space narrowing with diffuse disc bulge and disc desiccation. Associated reactive endplate spurring. Mild facet and ligament flavum hypertrophy. No significant spinal stenosis. Mild bilateral L3 foraminal narrowing. No frank impingement. L4-5: Trace retrolisthesis. Degenerative intervertebral disc space narrowing with diffuse disc bulge and disc desiccation. Superimposed reactive endplate change with marginal endplate osteophytic spurring. Superimposed small central disc protrusion indents the ventral thecal sac (series 14, image 32). Moderate facet and ligament flavum hypertrophy. Resultant mild canal with right worse than left lateral recess stenosis. Moderate right worse than left L4 foraminal narrowing. L5-S1: 4 mm anterolisthesis. Disc desiccation with mild annular disc bulge. Severe right worse than left facet arthrosis with associated small joint effusions. Secondary mild narrowing of the lateral recesses. Central canal remains patent. Moderate right with mild left L5 foraminal narrowing. IMPRESSION: 1. No acute abnormality within the lumbar spine. No evidence for cord compression. 2. Multifactorial degenerative changes at L4-5 with resultant mild spinal stenosis, with moderate right worse than left L4 foraminal narrowing. 3. 4 mm anterolisthesis of L5 on S1 with associated severe right worse than left facet arthrosis, with resultant moderate right L5 foraminal narrowing. 4. Additional more mild degenerative disc bulging and facet hypertrophy at L1-2 thru L3-4 without significant stenosis or neural impingement. 5. Large irregular multilobulated aneurysm involving the intra-abdominal aorta, partially visualized. Further assessment with dedicated CTA of the abdomen and pelvis recommended for further evaluation. Electronically  Signed   By: Rise Mu M.D.   On: 04/03/2021 00:28   CT Angio Chest/Abd/Pel for Dissection W and/or Wo Contrast  Result Date: 04/03/2021 CLINICAL DATA:  85 year old male with chest and back pain, and lower extremity weakness. Suspect aortic aneurysm and dissection EXAM: CT ANGIOGRAPHY CHEST, ABDOMEN AND PELVIS TECHNIQUE: Non-contrast CT of the chest was initially obtained. Multidetector CT imaging through the chest, abdomen and pelvis was performed using the standard protocol during bolus administration of intravenous contrast. Multiplanar reconstructed images and MIPs were obtained and reviewed to evaluate the vascular anatomy. CONTRAST:  OMNIPAQUE IOHEXOL 350 MG/ML SOLN COMPARISON:  None. FINDINGS: CTA CHEST FINDINGS Cardiovascular: No evidence of acute intramural hematoma on the initial noncontrast enhanced images. The aortic root is normal in caliber. There is significant motion artifact, but no definitive abnormality. Mildly ectatic but nonaneurysmal ascending thoracic aorta with a maximal diameter of 3.9 cm. No evidence of thoracic aortic dissection. Conventional 2 vessel arch anatomy. Mild elongation of the aortic isthmus and proximal descending thoracic aorta. The heart is at the upper limits of normal for size. Calcifications noted along the coronary arteries. No pericardial effusion. Normal caliber main and central pulmonary arteries. No central PE. Mediastinum/Nodes: Unremarkable CT appearance of the thyroid gland. No suspicious mediastinal or hilar adenopathy. No soft tissue mediastinal mass. The thoracic esophagus is unremarkable. Lungs/Pleura: Extensive respiratory motion artifact significantly limits evaluation of the lungs for pulmonary nodules. Extensive peripheral subpleural reticulation and architectural distortion throughout the lungs most significant in the lower lobes. Assessment for honeycomb in is limited by the respiratory motion artifact. Overall, findings are consistent  with pulmonary fibrosis.  No pneumothorax or pleural effusion. Musculoskeletal: No acute fracture or aggressive appearing lytic or blastic osseous lesion. Review of the MIP images confirms the above findings. CTA ABDOMEN AND PELVIS FINDINGS VASCULAR Aorta: Extremely tortuous abdominal aorta with a tri lobed juxtarenal abdominal aortic aneurysm. At the level of the renal arteries, the aorta makes an approximately 79 degree angle toward the right for several cm before making a second 79 degree angle inferiorly toward the bifurcation. This results in a zigzag configuration of the aneurysmal abdominal aorta. The largest aneurysmal segment is the most proximal juxtarenal portion of the aneurysm which measures up to 6.2 cm in diameter (see coronal reformatted image 69 of series 9). The more distal infrarenal segment measures up to 4.5 cm in diameter. The aneurysm extends from the origin of the right renal artery to the aortic bifurcation. No associated inflammatory changes or retroperitoneal stranding to suggest impending rupture. Celiac: The common hepatic and splenic arteries have separate origins from the abdominal aorta. A beaded appearance at the origin of the splenic artery may represent underlying fibromuscular dysplasia. No evidence of dissection, significant stenosis or significant aneurysm. SMA: Patent without evidence of aneurysm, dissection, vasculitis or significant stenosis. The SMA arises at the level of the initial aneurysmal portion of the abdominal aorta. Renals: The right renal artery arises at the level of the SMA and at the beginning of the aneurysmal segment of the abdominal aorta. The artery is patent without evidence of dissection or fibromuscular dysplasia. The left renal artery arises from the anterior aspect of the most aneurysmal segment of the aorta and appears diffusely diseased. There is a small accessory artery to the lower pole of the left kidney arising from the distal aneurysmal segment  of the aorta. IMA: Patent.  Suspect at least mild stenosis at the origin. Inflow: No evidence of dissection or aneurysm involving the iliac arteries. Scattered atherosclerotic plaque. Both internal iliac arteries remain widely patent. The external iliac arteries are relatively spared from disease. The visualized common femoral, superficial femoral and profunda femoral arteries are also relatively spared from disease. Veins: No focal venous abnormality. Review of the MIP images confirms the above findings. NON-VASCULAR Hepatobiliary: No focal liver abnormality is seen. No gallstones, gallbladder wall thickening, or biliary dilatation. Pancreas: Unremarkable. No pancreatic ductal dilatation or surrounding inflammatory changes. Spleen: Normal in size without focal abnormality. Adrenals/Urinary Tract: Unremarkable adrenal glands. No evidence of hydronephrosis or nephrolithiasis. Mild atrophy of the left kidney likely secondary to chronic arterial insufficiency. 4 cm simple cyst exophytic from the posterior lower pole of the left kidney. 5.4 cm simple cyst exophytic from the upper pole of the right kidney. Unremarkable ureters and bladder. Stomach/Bowel: No evidence of obstruction or focal bowel wall thickening. Normal appendix in the right lower quadrant. The terminal ileum is unremarkable. Lymphatic: No suspicious lymphadenopathy. Reproductive: Mild prostatomegaly. Other: No abdominal wall hernia or abnormality. No abdominopelvic ascites. Musculoskeletal: No acute fracture or aggressive appearing lytic or blastic osseous lesion. Multilevel degenerative disc disease. Review of the MIP images confirms the above findings. IMPRESSION: CTA CHEST 1. No evidence of thoracic aortic dissection or aneurysm. 2. The thoracic aorta is mildly ectatic but likely within normal limits for age at 3.9 cm. 3. Coronary artery calcifications. 4. Very limited evaluation of the lungs due to extensive respiratory motion artifact. However, CT  findings suggest underlying pulmonary fibrosis, perhaps related to interstitial lung disease such as usual interstitial pneumonitis. CTA ABD/PELVIS 1. 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 The aneurysmal segment of the aorta is highly tortuous with a zigzag shaped including two nearly 80 degree angulations. No evidence of rupture/impending rupture at this time. Recommend referral to a vascular specialist. This recommendation follows ACR consensus guidelines: White Paper of the ACR Incidental Findings Committee II on Vascular Findings. J Am Coll Radiol 2013; 10:789-794. 2. 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 3. Mild left renal atrophy likely related to chronic ischemia from renal artery stenosis. 4. Bilateral renal cysts. 5. Prostatomegaly. Signed, Sterling Big, MD, RPVI [facility] Electronically Signed   By: Malachy Moan M.D.   On: 04/03/2021 06:19    PHYSICAL EXAM Patient is awake, alert, oriented to person, place, month, year . Patient is able to give a more detailed hx today. Speech is clear and coherent. Patient naming, repetition, fluency, and comprehension appeared intact. Cranial Nerves: II: Visual Fields are full. Pupils are equal, round, and reactive to light 2 mm III,IV, VI: EOMI without ptosis or diploplia though he does have saccadic pursuits when looking towards the right V: Facial sensation is symmetric to temperature and light touch VII: Facial movement appears symmetric on exam today VIII: hearing is intact to voice X: Uvula elevates symmetrically XI: Shoulder shrug is symmetric. XII: tongue is midline without atrophy or fasciculations.  Motor: Tone is normal. Bulk is normal. 5/5 strength was present in BUE with 5/5 RLE and 4/5 LLE.   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  Plantars: Toes are mute bilaterally.  Cerebellar: FNF and HKS are intact bilaterally    ASSESSMENT/PLAN  Maxwell Rollins is a 85 y.o. male with a past medical history per [facility] of hyperlipidemia, hypertension (patient denies this and reports he is not on any antihypertensive medications), former smoking, numbness in the feet, vertigo, stage III Chronic kidney disease, dementia (which patient denies). Patient underwent imaging on presentation and MR Brain noted a punctuate infarct in the left mesial temporal lobe.   Patient also underwent MR of his lumbar spine due to the chief complaint of bilateral lower extremity weakness and an incidental finding of a AAA > 6cm was found. Patient intial complaint was BLE weakness; however on exam today patient reports that he was light headed, felt unsteady, and felt diffuse weakness in all extremities. Patient's stroke finding does not correlate with the reported symptoms and appears incidental. However, patient's history of vertigo, numbess in his feet, and CKD may more strongly explain etiology for patient's symptoms. At this time patient will undergo orthostatic vitals to assess for possible contribution to patient's symptoms. Unfortunately patient was confirmed to have a stroke and the finding of >6cm AAA puts patient at significant risk for future strokes as well as TIA. ASA has been initiated as vascular surgery concludes that patient is HDS and his symptoms do not seem to be referable to his aneurysm. Vascular surgery does not recommend starting other antiplatelet therapy or anticoagulant therapy at this time.   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 Chronic atrophy and small vessel ischemia.  MRI Single punctate 5 mm acute ischemic nonhemorrhagic infarct involving the mesial left temporal  lobe.  MRA Normal intracranial MR angiography of the large and medium size vessels.  Carotid Doppler unremarkable  2D Echo EF 60 to 65%  LE venous doppler no DVT  LDL 110  HgbA1c 5.8  UDS negative  VTE prophylaxis - Lovenox  Per EMR patient has been prescribed ASA OP, however patient endorsed that he had not been taking it regularly. Now on ASA 81mg  + Plavix 75mg  DAPT for 3 weeks and then aspirin alone.  Therapy recommendations: Pending  Disposition: Pending  Large AAA  CTA abdomen pelvis showed 6.2 cm AAA   vascular surgery is following, Dr. Arbie CookeyEarly recommend elective surgery  Dr. Arbie CookeyEarly okay with DAPT in the setting of AAA  Hypertension  Home meds:  Not taking any medications at home  stable . Long-term BP goal normotensive  Hyperlipidemia  Home meds:  atorvastatin 20mg    LDL 110, goal < 70  Increase Lipitor to 40  Continue statin at discharge  Other Stroke Risk Factors  Advanced Age >/= 8465   Hx of cigarette smoking, stopped 25 yrs ago   Other Active Problems  CKD, creatinine 1.5  COVID 12/2019 with residual persistent dizziness: Patient prescribed meclizine, unsure if patient was taking  Hospital day # 0  Eliseo GumJai McQuilla, MD PGY-1  ATTENDING NOTE: I reviewed above note and agree with the assessment and plan. Pt was seen and examined.   85 year old male with history of hypertension, hyperlipidemia, CKD, COVID in 12/2019 with persistent dizziness on meclizine admitted for lightheadedness and whole body weakness.  Patient stated that he was in doctor's office after appointment with his wife, he ate prepared sandwiches and then when he got up he felt lightheadedness, as well as whole body weakness.  He did not fall.  They went back home and called ambulance.  Currently patient stated that his symptoms  all resolved.  CT no acute abnormality.  MRI showed left punctate hippocampal infarct.  MRA head, Doppler, 2D echo, LE venous Doppler all unremarkable.  LDL 110, A1c 5.8, UDS negative, creatinine 1.5.  MRI of the lumbar spine no significant finding except large AAA.  CT abdomen pelvis showed large AAA 6.2 cm, vascular surgery consulted.  On exam, patient awake alert orientated, slight dysarthria but otherwise neurologically intact.  Patient symptoms concerning for presyncope standing up from sitting position.  Recommend orthostatic vital check to rule out orthostatic hypotension.  Patient stroke found on MRI likely incidental finding due to small vessel disease.  Now recommend aspirin Plavix DAPT for 3 weeks and then aspirin alone.  Increase Lipitor from 20 to 40 mg.  Discussed with Dr. Arbie CookeyEarly vascular surgery, okay with DAPT at this time.  Per Dr. Arbie CookeyEarly, patient will follow up with vascular surgery for elective procedure repair AAA.  For detailed assessment and plan, please refer to above as I have made changes wherever appropriate.   Neurology will sign off. Please call with questions. [facility]. Thanks for the consult.   Marvel PlanJindong Indio Santilli, MD PhD Stroke Neurology 04/03/2021 5:25 PM   To contact Stroke Continuity provider, please refer to [email or web address]. After hours, contact General Neurology

## 2021-04-03 NOTE — Progress Notes (Signed)
  Echocardiogram 2D Echocardiogram has been performed.  Pieter Partridge 04/03/2021, 11:38 AM

## 2021-04-03 NOTE — ED Notes (Addendum)
Patient had gotten up peed on the floor undress the patient .clean linen on bed with chux on sheet.pt.is ready to go toMRI WITH TRANSPORT.PATIENT HAS YELLOW ARMBAND  AND SIGN ON DOOR FRAME HIGH RISK FALL

## 2021-04-04 DIAGNOSIS — I10 Essential (primary) hypertension: Secondary | ICD-10-CM | POA: Diagnosis not present

## 2021-04-04 DIAGNOSIS — R7303 Prediabetes: Secondary | ICD-10-CM

## 2021-04-04 DIAGNOSIS — E785 Hyperlipidemia, unspecified: Secondary | ICD-10-CM

## 2021-04-04 DIAGNOSIS — R29898 Other symptoms and signs involving the musculoskeletal system: Secondary | ICD-10-CM

## 2021-04-04 DIAGNOSIS — I639 Cerebral infarction, unspecified: Secondary | ICD-10-CM | POA: Diagnosis not present

## 2021-04-04 DIAGNOSIS — D696 Thrombocytopenia, unspecified: Secondary | ICD-10-CM

## 2021-04-04 LAB — BASIC METABOLIC PANEL
Anion gap: 7 (ref 5–15)
BUN: 17 mg/dL (ref 8–23)
CO2: 28 mmol/L (ref 22–32)
Calcium: 8.7 mg/dL — ABNORMAL LOW (ref 8.9–10.3)
Chloride: 106 mmol/L (ref 98–111)
Creatinine, Ser: 1.29 mg/dL — ABNORMAL HIGH (ref 0.61–1.24)
GFR, Estimated: 55 mL/min — ABNORMAL LOW (ref >60)
Glucose, Bld: 97 mg/dL (ref 70–99)
Potassium: 3.9 mmol/L (ref 3.5–5.1)
Sodium: 141 mmol/L (ref 135–145)

## 2021-04-04 LAB — CBC
HCT: 41.4 % (ref 39.0–52.0)
Hemoglobin: 13.8 g/dL (ref 13.0–17.0)
MCH: 29.6 pg (ref 26.0–34.0)
MCHC: 33.3 g/dL (ref 30.0–36.0)
MCV: 88.8 fL (ref 80.0–100.0)
Platelets: 141 10*3/uL — ABNORMAL LOW (ref 150–400)
RBC: 4.66 MIL/uL (ref 4.22–5.81)
RDW: 15.5 % (ref 11.5–15.5)
WBC: 7.9 10*3/uL (ref 4.0–10.5)
nRBC: 0 % (ref 0.0–0.2)

## 2021-04-04 LAB — MAGNESIUM: Magnesium: 2.1 mg/dL (ref 1.7–2.4)

## 2021-04-04 MED ORDER — ADULT MULTIVITAMIN W/MINERALS CH
1.0000 | ORAL_TABLET | Freq: Every day | ORAL | Status: DC
  Administered 2021-04-04 – 2021-04-06 (×3): 1 via ORAL
  Filled 2021-04-04 (×3): qty 1

## 2021-04-04 MED ORDER — ENSURE ENLIVE PO LIQD
237.0000 mL | Freq: Two times a day (BID) | ORAL | Status: DC
  Administered 2021-04-04 – 2021-04-06 (×5): 237 mL via ORAL

## 2021-04-04 NOTE — TOC Initial Note (Signed)
Transition of Care [facility]) - Initial/Assessment Note    Patient Details  Name: Maxwell Rollins MRN: [id number] Date of Birth: 1936/06/27  Transition of Care [facility]) CM/SW Contact:    Kermit Balo, RN Phone Number: 04/04/2021, 10:42 AM  Clinical Narrative:                 CM spoke to patients wife over the phone as pt has some dementia. Wife states she can provide some hands of assist and provide 24 hour supervision. She states her son and BIL live close by and they can assist her as needed.  Wife states she can also put a bed downstairs as needed for the patient. Wife is able to take over administer pts medications at home and the driving.  Wife is concerned about the AAA and asking about plan for this.  TOC following.    aExpected Discharge Plan: Home w Home Health Services Barriers to Discharge: Continued Medical Work up   Patient Goals and CMS Choice   CMS Medicare.gov Compare Post Acute Care list provided to:: Patient Represenative (must comment) Choice offered to / list presented to : Spouse  Expected Discharge Plan and Services Expected Discharge Plan: Home w Home Health Services   Discharge Planning Services: CM Consult Post Acute Care Choice: Home Health Living arrangements for the past 2 months: Single Family Home                                      Prior Living Arrangements/Services Living arrangements for the past 2 months: Single Family Home Lives with:: Spouse Patient language and need for interpreter reviewed:: Yes Do you feel safe going back to the place where you live?: Yes      Need for Family Participation in Patient Care: Yes (Comment) Care giver support system in place?: Yes (comment)   Criminal Activity/Legal Involvement Pertinent to Current Situation/Hospitalization: No - Comment as needed  Activities of Daily Living Home Assistive Devices/Equipment: None ADL Screening (condition at time of admission) Patient's cognitive ability  adequate to safely complete daily activities?: No Is the patient deaf or have difficulty hearing?: No Does the patient have difficulty seeing, even when wearing glasses/contacts?: No Does the patient have difficulty concentrating, remembering, or making decisions?: Yes Patient able to express need for assistance with ADLs?: Yes Does the patient have difficulty dressing or bathing?: Yes Independently performs ADLs?: Yes (appropriate for developmental age) Does the patient have difficulty walking or climbing stairs?: No Weakness of Legs: Both Weakness of Arms/Hands: None  Permission Sought/Granted                  Emotional Assessment           Psych Involvement: No (comment)  Admission diagnosis:  Abdominal aneurysm (HCC) [I71.4] Acute CVA (cerebrovascular accident) (HCC) [I63.9] Weakness of both lower extremities [R29.898] Cerebrovascular accident (CVA), unspecified mechanism (HCC) [I63.9] Patient Active Problem List   Diagnosis Date Noted  . Acute CVA (cerebrovascular accident) (HCC) 04/03/2021  . Dyslipidemia   . Hypertension   . Dementia [facility])    PCP:  Elise Benne, MD Pharmacy:   [facility] - [place], [place] - [address] [place] [address] Phone: [phone] Fax: [phone]     Social Determinants of Health (SDOH) Interventions    Readmission Risk Interventions No flowsheet data found.

## 2021-04-04 NOTE — Progress Notes (Addendum)
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 approximately 79 degree angle toward the right for several cm before making a second  79 degree angle inferiorly toward the bifurcation. This results in a zigzag configuration of the aneurysmal abdominal aorta. The largest aneurysmal segment is the most proximal juxtarenal portion of the aneurysm which measures up to 6.2 cm in diameter (see coronal reformatted image 69 of series 9). The more distal infrarenal segment measures up to 4.5 cm in diameter. The aneurysm extends from the origin of the right renal artery to the aortic bifurcation. No associated inflammatory changes or retroperitoneal stranding to suggest impending rupture. Celiac: The common hepatic and splenic arteries have separate origins from the abdominal aorta. A beaded appearance at the origin of the splenic artery may represent underlying fibromuscular dysplasia.  No evidence of dissection, significant stenosis or significant aneurysm. SMA: Patent without evidence of aneurysm, dissection, vasculitis or significant stenosis. The SMA arises at the level of the initial aneurysmal portion of the abdominal aorta. Renals: The right renal artery arises at the level of the SMA and at the beginning of the aneurysmal segment of the abdominal aorta. The artery is patent without evidence of dissection or fibromuscular dysplasia. The left renal artery arises from the anterior aspect of the most aneurysmal segment of the aorta and appears diffusely diseased. There is a small accessory artery to the lower pole of the left kidney arising from the distal aneurysmal segment of the aorta. IMA: Patent.  Suspect at least mild stenosis at the origin. Inflow: No evidence of dissection or aneurysm involving the iliac arteries. Scattered atherosclerotic plaque. Both internal iliac arteries remain widely patent. The external iliac arteries are relatively spared from disease. The visualized common femoral, superficial femoral and profunda femoral arteries are also relatively spared from disease. Veins: No focal venous abnormality. Review of the MIP images confirms the above findings. NON-VASCULAR Hepatobiliary: No focal liver abnormality is seen. No gallstones, gallbladder wall thickening, or biliary dilatation. Pancreas: Unremarkable. No pancreatic ductal dilatation or surrounding inflammatory changes. Spleen: Normal in size without focal abnormality. Adrenals/Urinary Tract: Unremarkable adrenal glands. No evidence of hydronephrosis or nephrolithiasis. Mild atrophy of the left kidney likely secondary to chronic arterial insufficiency. 4 cm simple cyst exophytic from the posterior lower pole of the left kidney. 5.4 cm simple cyst exophytic from the upper pole of the right kidney. Unremarkable ureters and bladder. Stomach/Bowel: No evidence of obstruction or focal bowel wall thickening. Normal appendix  in the right lower quadrant. The terminal ileum is unremarkable. Lymphatic: No suspicious lymphadenopathy. Reproductive: Mild prostatomegaly. Other: No abdominal wall hernia or abnormality. No abdominopelvic ascites. Musculoskeletal: No acute fracture or aggressive appearing lytic or blastic osseous lesion. Multilevel degenerative disc disease. Review of the MIP images confirms the above findings. IMPRESSION: CTA CHEST 1. No evidence of thoracic aortic dissection or aneurysm. 2. The thoracic aorta is mildly ectatic but likely within normal limits for age at 3.9 cm. 3. Coronary artery calcifications. 4. Very limited evaluation of the lungs due to extensive respiratory motion artifact. However, CT findings suggest underlying pulmonary fibrosis, perhaps related to interstitial lung disease such as usual interstitial pneumonitis. CTA ABD/PELVIS 1. 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 The aneurysmal segment of the aorta is highly tortuous with a zigzag shaped including two nearly 80 degree angulations. No evidence of rupture/impending rupture at this time. Recommend referral to a vascular specialist. This recommendation follows ACR consensus guidelines: White Paper of the ACR Incidental Findings Committee II on Vascular Findings. J Am Coll Radiol 2013; 10:789-794. 2. The right renal artery and SMA arise at the  beginning of the aneurysmal segment while the left renal arteries arise from the aneurysmal aorta. 3. Mild left renal atrophy likely related to chronic ischemia from renal artery stenosis. 4. Bilateral renal cysts. 5. Prostatomegaly. Signed, Sterling Big, MD, RPVI [facility] Electronically Signed   By: Malachy Moan M.D.   On: 04/03/2021 06:19   VAS US CAROTID  Result Date: 04/04/2021 Carotid Arterial Duplex Study Indications:       CVA. Risk Factors:      Hypertension,  hyperlipidemia, no history of smoking. Comparison Study:  No previous exams Performing Technologist: Ernestene Mention  Examination Guidelines: A complete evaluation includes B-mode imaging, spectral Doppler, color Doppler, and power Doppler as needed of all accessible portions of each vessel. Bilateral testing is considered an integral part of a complete examination. Limited examinations for reoccurring indications may be performed as noted.  Right Carotid Findings: +----------+--------+--------+--------+------------------+------------------+           PSV cm/sEDV cm/sStenosisPlaque DescriptionComments           +----------+--------+--------+--------+------------------+------------------+ CCA Prox  55      14                                intimal thickening +----------+--------+--------+--------+------------------+------------------+ CCA Distal67      17                                intimal thickening +----------+--------+--------+--------+------------------+------------------+ ICA Prox  51      15                                                   +----------+--------+--------+--------+------------------+------------------+ ICA Distal55      16                                                   +----------+--------+--------+--------+------------------+------------------+ ECA       69      14                                                   +----------+--------+--------+--------+------------------+------------------+ +----------+--------+-------+----------------+-------------------+           PSV cm/sEDV cmsDescribe        Arm Pressure (mmHG) +----------+--------+-------+----------------+-------------------+ [id number]             Multiphasic, WNL                    +----------+--------+-------+----------------+-------------------+ +---------+--------+--+--------+-+---------+ VertebralPSV cm/s37EDV cm/s8Antegrade +---------+--------+--+--------+-+---------+   Left Carotid Findings: +----------+--------+--------+--------+------------------+------------------+           PSV cm/sEDV cm/sStenosisPlaque DescriptionComments           +----------+--------+--------+--------+------------------+------------------+ CCA Prox  77      18                                intimal thickening +----------+--------+--------+--------+------------------+------------------+ CCA Distal79  20                                intimal thickening +----------+--------+--------+--------+------------------+------------------+ ICA Prox  46      16                                                   +----------+--------+--------+--------+------------------+------------------+ ICA Distal62      20                                                   +----------+--------+--------+--------+------------------+------------------+ ECA       51      10                                                   +----------+--------+--------+--------+------------------+------------------+ +----------+--------+--------+----------------+-------------------+           PSV cm/sEDV cm/sDescribe        Arm Pressure (mmHG) +----------+--------+--------+----------------+-------------------+ [id number]              Multiphasic, WNL                    +----------+--------+--------+----------------+-------------------+ +---------+--------+--+--------+--+---------+ VertebralPSV cm/s39EDV cm/s11Antegrade +---------+--------+--+--------+--+---------+   Summary: Right Carotid: The extracranial vessels were near-normal with only minimal wall                thickening or plaque. Left Carotid: The extracranial vessels were near-normal with only minimal wall               thickening or plaque. Vertebrals:  Bilateral vertebral arteries demonstrate antegrade flow. Subclavians: Normal flow hemodynamics were seen in bilateral subclavian              arteries. *See table(s) above for  measurements and observations.  Electronically signed by Delia Heady MD on 04/04/2021 at 8:23:52 AM.    Final    VAS [place] LOWER EXTREMITY VENOUS (DVT)  Result Date: 04/04/2021  Lower Venous DVT Study Indications: Stroke.  Comparison Study: No previous exams Performing Technologist: Ernestene Mention  Examination Guidelines: A complete evaluation includes B-mode imaging, spectral Doppler, color Doppler, and power Doppler as needed of all accessible portions of each vessel. Bilateral testing is considered an integral part of a complete examination. Limited examinations for reoccurring indications may be performed as noted. The reflux portion of the exam is performed with the patient in reverse Trendelenburg.  +---------+---------------+---------+-----------+----------+--------------+ RIGHT    CompressibilityPhasicitySpontaneityPropertiesThrombus Aging +---------+---------------+---------+-----------+----------+--------------+ CFV      Full           Yes      Yes                                 +---------+---------------+---------+-----------+----------+--------------+ SFJ      Full                                                        +---------+---------------+---------+-----------+----------+--------------+  FV Prox  Full           Yes      Yes                                 +---------+---------------+---------+-----------+----------+--------------+ FV Mid   Full           Yes      Yes                                 +---------+---------------+---------+-----------+----------+--------------+ FV DistalFull           Yes      Yes                                 +---------+---------------+---------+-----------+----------+--------------+ PFV      Full                                                        +---------+---------------+---------+-----------+----------+--------------+ POP      Full           Yes      Yes                                  +---------+---------------+---------+-----------+----------+--------------+ PTV      Full                                                        +---------+---------------+---------+-----------+----------+--------------+ PERO     Full                                                        +---------+---------------+---------+-----------+----------+--------------+   +---------+---------------+---------+-----------+----------+--------------+ LEFT     CompressibilityPhasicitySpontaneityPropertiesThrombus Aging +---------+---------------+---------+-----------+----------+--------------+ CFV      Full           Yes      Yes                                 +---------+---------------+---------+-----------+----------+--------------+ SFJ      Full                                                        +---------+---------------+---------+-----------+----------+--------------+ FV Prox  Full           Yes      Yes                                 +---------+---------------+---------+-----------+----------+--------------+ FV Mid   Full  Yes      Yes                                 +---------+---------------+---------+-----------+----------+--------------+ FV DistalFull           Yes      Yes                                 +---------+---------------+---------+-----------+----------+--------------+ PFV      Full                                                        +---------+---------------+---------+-----------+----------+--------------+ POP      Full           Yes      Yes                                 +---------+---------------+---------+-----------+----------+--------------+ PTV      Full                                                        +---------+---------------+---------+-----------+----------+--------------+ PERO     Full                                                         +---------+---------------+---------+-----------+----------+--------------+     Summary: BILATERAL: - No evidence of deep vein thrombosis seen in the lower extremities, bilaterally. - No evidence of superficial venous thrombosis in the lower extremities, bilaterally. -No evidence of popliteal cyst, bilaterally.   *See table(s) above for measurements and observations. Electronically signed by Gretta Beganodd Early MD on 04/04/2021 at 6:00:22 AM.    Final     Scheduled Meds: . aspirin EC  81 mg Oral Daily  . atorvastatin  40 mg Oral QHS  . clopidogrel  75 mg Oral Daily  . enoxaparin (LOVENOX) injection  40 mg Subcutaneous Q24H  . feeding supplement  237 mL Oral BID BM  . multivitamin with minerals  1 tablet Oral Daily   Continuous Infusions: . sodium chloride 50 mL/hr at 04/03/21 2110     LOS: 1 day   Time spent: 35 minutes   Hughie Clossavi Uilani Sanville, MD [facility]  04/04/2021, 1:02 PM   To contact the attending provider between 7A-7P or the covering provider during after hours 7P-7A, please log into the web site [email or web address].

## 2021-04-04 NOTE — Consult Note (Signed)
Physical Medicine and Rehabilitation Consult Reason for Consult: Intermittent confusion with bilateral lower extremity weakness Referring Physician: Dr.Pahwani   HPI: Maxwell Rollins is a 85 y.o. right-handed male with history of mild dementia, hyperlipidemia and hypertension.  History taken from chart review and patient.  Patient lives with spouse.  Independent prior to admission and still driving.  Two-level home bed and bath main level.  He presented on 04/03/2022 with bilateral back pain with associated leg numbness of acute onset.  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  CT angio of the chest showed no evidence of thoracic aortic dissection or aneurysm.  MRA of the head unremarkable.  Patient did not receive TPA.  Admission chemistries unremarkable except creatinine 1.49, urine drug screen negative platelets 146,000.  Echocardiogram ejection fraction of 60-65%, grade 1 diastolic dysfunction.  Carotid Dopplers with no ICA stenosis.  Currently maintained on aspirin as well as Plavix for CVA prophylaxis x3 weeks and aspirin alone.  Venous Dopplers lower extremities negative for DVT.  Subcutaneous Lovenox for DVT prophylaxis.  Vascular surgery follow-up in regards to AAA and patient remains asymptomatic with plan follow-up [facility] to see Dr. Pattricia Boss.  Therapy evaluations completed due to patient decreased functional mobility recommendations of physical medicine rehab consult.   Review of Systems  Constitutional:       Decrease in appetite  HENT: Negative for hearing loss.   Eyes: Negative for blurred vision and double vision.  Respiratory: Negative for cough and shortness of breath.   Cardiovascular: Negative for chest pain, palpitations and leg swelling.  Gastrointestinal: Positive for constipation. Negative for  heartburn, nausea and vomiting.  Genitourinary: Negative for dysuria, flank pain and hematuria.  Musculoskeletal: Positive for back pain, joint pain and myalgias.  Skin: Negative for rash.  Neurological: Positive for weakness. Negative for sensory change.  Psychiatric/Behavioral:       Reports some decrease in memory over the last few months.  All other systems reviewed and are negative.  Past Medical History:  Diagnosis Date  . Dementia (HCC)   . Dyslipidemia   . Hypertension    No past surgical history No pertinent family history of premature CVA Social History:  reports that he has never smoked. He has never used smokeless tobacco. He reports that he does not drink alcohol and does not use drugs. Allergies:  Allergies  Allergen Reactions  . Contrast Media [Iodinated Diagnostic Agents]     Other reaction(s): rash/itching  . Red Dye    Medications Prior to Admission  Medication Sig Dispense Refill  . atorvastatin (LIPITOR) 20 MG tablet Take 20 mg by mouth at bedtime.    . meclizine (ANTIVERT) 25 MG tablet Take 12.5 mg by mouth 3 (three) times daily as needed for dizziness or nausea.    . Pyridoxine HCl (VITAMIN B-6 PO) Take 1 tablet by mouth daily.    . vitamin B-12 (CYANOCOBALAMIN) 1000 MCG tablet Take 1,000 mg by mouth daily.    . vitamin C (ASCORBIC ACID) 500 MG tablet Take 500 mg by mouth daily.    . Vitamin D, Ergocalciferol, (DRISDOL) 1.25 MG (50000 UNIT) CAPS capsule Take 500,000 Units by mouth every Monday.      Home: Home Living Family/patient expects to be discharged to:: Private residence Living Arrangements: Spouse/significant other Available Help at Discharge: Family Type of Home:  Other(Comment) (Condo) Home Access: Level entry Entrance Stairs-Number of Steps: 0 Home Layout: Two level,Able to live on main level with bedroom/bathroom,1/2 bath on main level Alternate Level Stairs-Number of Steps: 8 then 6 Bathroom Shower/Tub: Health visitor:  Handicapped height Home Equipment: Crutches,Cane - single point,Shower seat - built in,Grab bars - tub/shower,Grab bars - toilet (access to walker, BSC if needed)  Functional History: Prior Function Level of Independence: Independent Comments: spouse completes IADLs, pt drives and completes med mgmt. No use of AD for mobility. Pt reports x1 fall in past 6 months in which he tripped over an object. Functional Status:  Mobility: Bed Mobility Overal bed mobility: Needs Assistance Bed Mobility: Supine to Sit Supine to sit: Supervision General bed mobility comments: for safety, HOB elevated Transfers Overall transfer level: Needs assistance Equipment used: Rolling walker (2 wheeled),None Transfers: Sit to/from Stand Sit to Stand: Min assist,Min guard,+2 safety/equipment General transfer comment: min assist +2 safety from EOB, fading to min guard +2 safety from recliner; initally assist to power up and steady Ambulation/Gait Ambulation/Gait assistance: Min assist,Min guard,+2 safety/equipment Gait Distance (Feet): 50 Feet Assistive device: Rolling walker (2 wheeled),None Gait Pattern/deviations: Step-through pattern,Decreased step length - right,Decreased step length - left,Decreased stride length,Trunk flexed General Gait Details: Pt ambulating with RW initially, needing cues to also advance RW after every 2 steps for safety as he would forget. MinA for steadying and safety initially. Progressed to min guard without UE support, but displayed further decreased bil step length and increased bil knee and ankle instability. Gait velocity: reduced Gait velocity interpretation: <1.31 ft/sec, indicative of household ambulator    ADL: ADL Overall ADL's : Needs assistance/impaired Grooming: Min guard,Standing,Wash/dry hands,Wash/dry face Upper Body Bathing: Set up,Sitting Lower Body Bathing: Minimal assistance,Sit to/from stand Upper Body Dressing : Set up,Sitting Lower Body Dressing: Minimal  assistance,Sit to/from stand Toilet Transfer: Minimal assistance,+2 for safety/equipment,Ambulation,RW Toilet Transfer Details (indicate cue type and reason): simulated in room Toileting- Clothing Manipulation and Hygiene: Maximal assistance,+2 for safety/equipment,Sit to/from stand Toileting - Clothing Manipulation Details (indicate cue type and reason): incontinent BM in bed, pt with no awareness and requires assist for hygiene Functional mobility during ADLs: Minimal assistance,+2 for safety/equipment,Rolling walker,Cueing for sequencing General ADL Comments: pt limited by balance, cognition, activity tolerance  Cognition: Cognition Overall Cognitive Status: History of cognitive impairments - at baseline Orientation Level: Oriented X4 Cognition Arousal/Alertness: Awake/alert Behavior During Therapy: WFL for tasks assessed/performed Overall Cognitive Status: History of cognitive impairments - at baseline Area of Impairment: Memory,Awareness,Problem solving,Following commands Memory: Decreased short-term memory Following Commands: Follows one step commands consistently,Follows one step commands with increased time,Follows multi-step commands inconsistently Awareness: Emergent Problem Solving: Slow processing,Requires verbal cues,Difficulty sequencing General Comments: pt with hx of dementia, oriented and pleasant.  follows simple commands and able to complete functional ADL tasks but difficulty sequencing multiple step commands, recalling, and poor awareness (BM in bed with no awareness). Poor sequencing, needing repeated verbal cues to also push RW rather than try to keep walking without advancing it.  Blood pressure (!) 138/99, pulse 68, temperature 98.2 F (36.8 C), temperature source Oral, resp. rate 18, height 6\' 2"  (1.88 m), weight 90.6 kg, SpO2 96 %. Physical Exam Vitals reviewed.  Constitutional:      General: He is not in acute distress.    Appearance: He is not ill-appearing.   HENT:     Head: Normocephalic and atraumatic.     Right Ear: External ear normal.     Left Ear: External ear  normal.     Nose: Nose normal.  Eyes:     General:        Right eye: No discharge.        Left eye: No discharge.     Extraocular Movements: Extraocular movements intact.  Cardiovascular:     Rate and Rhythm: Normal rate and regular rhythm.  Pulmonary:     Effort: Pulmonary effort is normal. No respiratory distress.     Breath sounds: No stridor.  Abdominal:     General: Bowel sounds are normal.     Palpations: Abdomen is soft.  Musculoskeletal:     Cervical back: Normal range of motion and neck supple.     Comments: No edema or tenderness in extremities  Skin:    General: Skin is warm and dry.  Neurological:     Mental Status: He is alert.     Comments: Alert Makes eye contact with examiner.   Follows simple commands.   Oriented to person place with some initial hesitation in providing the appropriate year. Motor: Bilateral upper extremities: 5/5 proximal distal Bilateral lower extremities: 4+/5 hip flexion, knee extension, 5/5 ankle dorsiflexion Right upper extremity: Mild dysmetria  Psychiatric:        Mood and Affect: Affect is blunt.        Speech: Speech is delayed.     Results for orders placed or performed during the hospital encounter of 04/02/21 (from the past 24 hour(s))  CBC     Status: Abnormal   Collection Time: 04/04/21  8:39 AM  Result Value Ref Range   WBC 7.9 4.0 - 10.5 K/uL   RBC 4.66 4.22 - 5.81 MIL/uL   Hemoglobin 13.8 13.0 - 17.0 g/dL   HCT 09.841.4 11.939.0 - 14.752.0 %   MCV 88.8 80.0 - 100.0 fL   MCH 29.6 26.0 - 34.0 pg   MCHC 33.3 30.0 - 36.0 g/dL   RDW 82.915.5 56.211.5 - 13.015.5 %   Platelets 141 (L) 150 - 400 K/uL   nRBC 0.0 0.0 - 0.2 %  Basic metabolic panel     Status: Abnormal   Collection Time: 04/04/21  8:39 AM  Result Value Ref Range   Sodium 141 135 - 145 mmol/L   Potassium 3.9 3.5 - 5.1 mmol/L   Chloride 106 98 - 111 mmol/L   CO2 28 22  - 32 mmol/L   Glucose, Bld 97 70 - 99 mg/dL   BUN 17 8 - 23 mg/dL   Creatinine, Ser 8.651.29 (H) 0.61 - 1.24 mg/dL   Calcium 8.7 (L) 8.9 - 10.3 mg/dL   GFR, Estimated 55 (L) >60 mL/min   Anion gap 7 5 - 15  Magnesium     Status: None   Collection Time: 04/04/21  8:39 AM  Result Value Ref Range   Magnesium 2.1 1.7 - 2.4 mg/dL   CT HEAD WO CONTRAST  Result Date: 04/02/2021 CLINICAL DATA:  Acute neurological deficit with bilateral leg weakness and numbness beginning this afternoon. EXAM: CT HEAD WITHOUT CONTRAST TECHNIQUE: Contiguous axial images were obtained from the base of the skull through the vertex without intravenous contrast. COMPARISON:  None. FINDINGS: Brain: Diffuse cerebral atrophy. Ventricular dilatation consistent with central atrophy. Low-attenuation changes in the deep white matter consistent with small vessel ischemia. No abnormal extra-axial fluid collections. No mass effect or midline shift. Gray-white matter junctions are distinct. Basal cisterns are not effaced. No acute intracranial hemorrhage. Vascular: No hyperdense vessel or unexpected calcification. Skull: Normal. Negative for fracture  or focal lesion. Sinuses/Orbits: Postoperative changes in the maxillary antra and ethmoid air cells. Mild mucosal thickening in the paranasal sinuses. No acute air-fluid levels. Mastoid air cells are clear. Other: None. IMPRESSION: 1. No acute intracranial abnormalities. 2. Chronic atrophy and small vessel ischemia. Electronically Signed   By: Burman Nieves M.D.   On: 04/02/2021 19:15   MR ANGIO HEAD WO CONTRAST  Result Date: 04/03/2021 CLINICAL DATA:  Vertigo.  Numbness of the feet.  Dementia. EXAM: MRA HEAD WITHOUT CONTRAST TECHNIQUE: Angiographic images of the Circle of Willis were obtained using MRA technique without intravenous contrast. COMPARISON:  MRI yesterday. FINDINGS: Both internal carotid arteries are widely patent into the brain. No siphon stenosis. The anterior and middle cerebral  vessels are patent without proximal stenosis, aneurysm or vascular malformation. Both vertebral arteries are widely patent to the basilar. No basilar stenosis. Posterior circulation branch vessels appear normal. IMPRESSION: Normal intracranial MR angiography of the large and medium size vessels. Electronically Signed   By: Paulina Fusi M.D.   On: 04/03/2021 09:17   MR BRAIN WO CONTRAST  Result Date: 04/03/2021 CLINICAL DATA:  Initial evaluation for lower extremity numbness. EXAM: MRI HEAD WITHOUT CONTRAST TECHNIQUE: Multiplanar, multiecho pulse sequences of the brain and surrounding structures were obtained without intravenous contrast. COMPARISON:  Prior head CT from earlier the same day. FINDINGS: Brain: Moderately advanced age-related cerebral atrophy. Patchy T2/FLAIR hyperintensity within the periventricular and deep white matter both cerebral hemispheres most consistent with chronic small vessel ischemic disease, mild for age. Single punctate 5 mm focus of diffusion abnormality seen involving the mesial left temporal lobe, consistent with a small acute ischemic infarct (series 2, image 21). No associated hemorrhage or mass effect. No other diffusion abnormality to suggest acute or subacute ischemia. Gray-white matter differentiation otherwise maintained. No encephalomalacia to suggest chronic cortical infarction elsewhere within the brain. No other evidence for acute or chronic intracranial hemorrhage. No mass lesion, midline shift or mass effect. Mild ventricular prominence related to global parenchymal volume loss without hydrocephalus. No extra-axial fluid collection. Pituitary gland suprasellar region normal. Midline structures intact. Vascular: Major intracranial vascular flow voids are maintained. Skull and upper cervical spine: Craniocervical junction within normal limits. Bone marrow signal intensity normal. No scalp soft tissue abnormality. Sinuses/Orbits: Patient status post bilateral ocular lens  replacement. Globes and orbital soft tissues demonstrate no acute finding. Chronic frontoethmoidal sinusitis noted. Mastoid air cells are clear. Inner ear structures grossly normal. Other: None. IMPRESSION: 1. 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 2. No other acute intracranial abnormality. 3. Moderately advanced age-related cerebral atrophy with mild chronic small vessel ischemic disease. 4. Chronic frontoethmoidal sinusitis. Electronically Signed   By: Rise Mu M.D.   On: 04/03/2021 00:18   MR LUMBAR SPINE WO CONTRAST  Result Date: 04/03/2021 CLINICAL DATA:  Initial evaluation for low back pain, cauda equina syndrome suspected. EXAM: MRI LUMBAR SPINE WITHOUT CONTRAST TECHNIQUE: Multiplanar, multisequence MR imaging of the lumbar spine was performed. No intravenous contrast was administered. COMPARISON:  None available. FINDINGS: Segmentation: Standard. Lowest well-formed disc space labeled the L5-S1 level. Alignment: 4 mm anterolisthesis of L5 on S1, with trace 2 mm retrolisthesis of L4 on L5. Findings chronic and facet mediated. Alignment otherwise normal with preservation of the normal lumbar lordosis. Vertebrae: Vertebral body height maintained without acute or chronic fracture. Bone marrow signal intensity diffusely heterogeneous without discrete or worrisome osseous lesion. No abnormal marrow edema. Conus medullaris and cauda equina: Conus extends  to the T12-L1 level. Conus and cauda equina appear normal. Paraspinal and other soft tissues: Paraspinous soft tissues demonstrate no acute finding. Multiple scattered benign appearing cyst noted within the partially visualized kidneys, largest of which measures 4.1 cm on the left. A large irregular multilobulated aneurysm involving the intra-abdominal aorta partially visualized, measuring up to at least 5.3 cm in AP diameter (series 11, image 12). Disc levels: T12-L1: Disc desiccation  without significant disc bulge. Anterior endplate osteophytic spurring. No spinal stenosis. Foramina remain patent. L1-2: Disc desiccation with mild disc bulge. Anterior endplate osteophytic spurring. Mild facet and ligament flavum hypertrophy. No significant spinal stenosis. Foramina remain patent. L2-3: Diffuse disc bulge with disc desiccation. Reactive endplate spurring. Mild facet and ligament flavum hypertrophy. No significant spinal stenosis. Foramina remain patent. L3-4: Advanced degenerative intervertebral disc space narrowing with diffuse disc bulge and disc desiccation. Associated reactive endplate spurring. Mild facet and ligament flavum hypertrophy. No significant spinal stenosis. Mild bilateral L3 foraminal narrowing. No frank impingement. L4-5: Trace retrolisthesis. Degenerative intervertebral disc space narrowing with diffuse disc bulge and disc desiccation. Superimposed reactive endplate change with marginal endplate osteophytic spurring. Superimposed small central disc protrusion indents the ventral thecal sac (series 14, image 32). Moderate facet and ligament flavum hypertrophy. Resultant mild canal with right worse than left lateral recess stenosis. Moderate right worse than left L4 foraminal narrowing. L5-S1: 4 mm anterolisthesis. Disc desiccation with mild annular disc bulge. Severe right worse than left facet arthrosis with associated small joint effusions. Secondary mild narrowing of the lateral recesses. Central canal remains patent. Moderate right with mild left L5 foraminal narrowing. IMPRESSION: 1. No acute abnormality within the lumbar spine. No evidence for cord compression. 2. Multifactorial degenerative changes at L4-5 with resultant mild spinal stenosis, with moderate right worse than left L4 foraminal narrowing. 3. 4 mm anterolisthesis of L5 on S1 with associated severe right worse than left facet arthrosis, with resultant moderate right L5 foraminal narrowing. 4. Additional more  mild degenerative disc bulging and facet hypertrophy at L1-2 thru L3-4 without significant stenosis or neural impingement. 5. Large irregular multilobulated aneurysm involving the intra-abdominal aorta, partially visualized. Further assessment with dedicated CTA of the abdomen and pelvis recommended for further evaluation. Electronically Signed   By: Rise Mu M.D.   On: 04/03/2021 00:28   ECHOCARDIOGRAM COMPLETE  Result Date: 04/03/2021    ECHOCARDIOGRAM REPORT   Patient Name:   DAVON ABDELAZIZ Date of Exam: 04/03/2021 Medical Rec #:  [id number]            Height:       74.0 in Accession #:    [id number]           Weight:       203.0 lb Date of Birth:  1936/09/06            BSA:          2.187 m Patient Age:    84 years             BP:           164/104 mmHg Patient Gender: M                    HR:           79 bpm. Exam Location:  Inpatient Procedure: 2D Echo, Cardiac Doppler and Color Doppler Indications:    CVA  History:        Patient has no prior history of Echocardiogram examinations.  Signs/Symptoms:Dementia; Risk Factors:Hypertension,                 Dyslipidemia, Former Smoker and Diabetes. CKD.  Sonographer:    Lavenia Atlas Referring Phys: [id number] SRISHTI L BHAGAT IMPRESSIONS  1. Left ventricular ejection fraction, by estimation, is 60 to 65%. The left ventricle has normal function. The left ventricle has no regional wall motion abnormalities. Left ventricular diastolic parameters are consistent with Grade I diastolic dysfunction (impaired relaxation).  2. Right ventricular systolic function is normal. The right ventricular size is normal. There is normal pulmonary artery systolic pressure.  3. The mitral valve is normal in structure. No evidence of mitral valve regurgitation. No evidence of mitral stenosis.  4. The aortic valve is normal in structure. Aortic valve regurgitation is not visualized. Mild aortic valve sclerosis is present, with no evidence of aortic valve  stenosis.  5. The inferior vena cava is normal in size with greater than 50% respiratory variability, suggesting right atrial pressure of 3 mmHg. FINDINGS  Left Ventricle: Left ventricular ejection fraction, by estimation, is 60 to 65%. The left ventricle has normal function. The left ventricle has no regional wall motion abnormalities. The left ventricular internal cavity size was normal in size. There is  no left ventricular hypertrophy. Left ventricular diastolic parameters are consistent with Grade I diastolic dysfunction (impaired relaxation). Right Ventricle: The right ventricular size is normal. No increase in right ventricular wall thickness. Right ventricular systolic function is normal. There is normal pulmonary artery systolic pressure. The tricuspid regurgitant velocity is 2.38 m/s, and  with an assumed right atrial pressure of 3 mmHg, the estimated right ventricular systolic pressure is 25.7 mmHg. Left Atrium: Left atrial size was normal in size. Right Atrium: Right atrial size was normal in size. Pericardium: There is no evidence of pericardial effusion. Mitral Valve: The mitral valve is normal in structure. No evidence of mitral valve regurgitation. No evidence of mitral valve stenosis. Tricuspid Valve: The tricuspid valve is normal in structure. Tricuspid valve regurgitation is not demonstrated. No evidence of tricuspid stenosis. Aortic Valve: The aortic valve is normal in structure. Aortic valve regurgitation is not visualized. Mild aortic valve sclerosis is present, with no evidence of aortic valve stenosis. Pulmonic Valve: The pulmonic valve was normal in structure. Pulmonic valve regurgitation is not visualized. No evidence of pulmonic stenosis. Aorta: The aortic root is normal in size and structure. Venous: The inferior vena cava is normal in size with greater than 50% respiratory variability, suggesting right atrial pressure of 3 mmHg. IAS/Shunts: No atrial level shunt detected by color flow  Doppler.  LEFT VENTRICLE PLAX 2D LVIDd:         5.00 cm  Diastology LVIDs:         3.40 cm  LV e' medial:    10.40 cm/s LV PW:         1.10 cm  LV E/e' medial:  7.0 LV IVS:        1.10 cm  LV e' lateral:   6.42 cm/s LVOT diam:     2.40 cm  LV E/e' lateral: 11.4 LV SV:         71 LV SV Index:   32 LVOT Area:     4.52 cm  RIGHT VENTRICLE RV Basal diam:  2.30 cm RV S prime:     20.00 cm/s TAPSE (M-mode): 3.0 cm LEFT ATRIUM             Index  RIGHT ATRIUM           Index LA diam:        3.80 cm 1.74 cm/m  RA Area:     16.50 cm LA Vol (A2C):   50.9 ml 23.27 ml/m RA Volume:   39.80 ml  18.20 ml/m LA Vol (A4C):   29.1 ml 13.30 ml/m LA Biplane Vol: 41.6 ml 19.02 ml/m  AORTIC VALVE LVOT Vmax:   79.00 cm/s LVOT Vmean:  45.400 cm/s LVOT VTI:    0.156 m  AORTA Ao Root diam: 3.40 cm MITRAL VALVE               TRICUSPID VALVE MV Area (PHT): 7.99 cm    TR Peak grad:   22.7 mmHg MV Decel Time: 95 msec     TR Vmax:        238.00 cm/s MV E velocity: 73.30 cm/s MV A velocity: 87.80 cm/s  SHUNTS MV E/A ratio:  0.83        Systemic VTI:  0.16 m                            Systemic Diam: 2.40 cm Rachelle Hora Croitoru MD Electronically signed by Thurmon Fair MD Signature Date/Time: 04/03/2021/2:34:57 PM    Final    CT Angio Chest/Abd/Pel for Dissection W and/or Wo Contrast  Result Date: 04/03/2021 CLINICAL DATA:  85 year old male with chest and back pain, and lower extremity weakness. Suspect aortic aneurysm and dissection EXAM: CT ANGIOGRAPHY CHEST, ABDOMEN AND PELVIS TECHNIQUE: Non-contrast CT of the chest was initially obtained. Multidetector CT imaging through the chest, abdomen and pelvis was performed using the standard protocol during bolus administration of intravenous contrast. Multiplanar reconstructed images and MIPs were obtained and reviewed to evaluate the vascular anatomy. CONTRAST:  OMNIPAQUE IOHEXOL 350 MG/ML SOLN COMPARISON:  None. FINDINGS: CTA CHEST FINDINGS Cardiovascular: No evidence of acute intramural  hematoma on the initial noncontrast enhanced images. The aortic root is normal in caliber. There is significant motion artifact, but no definitive abnormality. Mildly ectatic but nonaneurysmal ascending thoracic aorta with a maximal diameter of 3.9 cm. No evidence of thoracic aortic dissection. Conventional 2 vessel arch anatomy. Mild elongation of the aortic isthmus and proximal descending thoracic aorta. The heart is at the upper limits of normal for size. Calcifications noted along the coronary arteries. No pericardial effusion. Normal caliber main and central pulmonary arteries. No central PE. Mediastinum/Nodes: Unremarkable CT appearance of the thyroid gland. No suspicious mediastinal or hilar adenopathy. No soft tissue mediastinal mass. The thoracic esophagus is unremarkable. Lungs/Pleura: Extensive respiratory motion artifact significantly limits evaluation of the lungs for pulmonary nodules. Extensive peripheral subpleural reticulation and architectural distortion throughout the lungs most significant in the lower lobes. Assessment for honeycomb in is limited by the respiratory motion artifact. Overall, findings are consistent with pulmonary fibrosis. No pneumothorax or pleural effusion. Musculoskeletal: No acute fracture or aggressive appearing lytic or blastic osseous lesion. Review of the MIP images confirms the above findings. CTA ABDOMEN AND PELVIS FINDINGS VASCULAR Aorta: Extremely tortuous abdominal aorta with a tri lobed juxtarenal abdominal aortic aneurysm. At the level of the renal arteries, the aorta makes an approximately 79 degree angle toward the right for several cm before making a second 79 degree angle inferiorly toward the bifurcation. This results in a zigzag configuration of the aneurysmal abdominal aorta. The largest aneurysmal segment is the most proximal juxtarenal portion of the aneurysm which measures up  to 6.2 cm in diameter (see coronal reformatted image 69 of series 9). The more  distal infrarenal segment measures up to 4.5 cm in diameter. The aneurysm extends from the origin of the right renal artery to the aortic bifurcation. No associated inflammatory changes or retroperitoneal stranding to suggest impending rupture. Celiac: The common hepatic and splenic arteries have separate origins from the abdominal aorta. A beaded appearance at the origin of the splenic artery may represent underlying fibromuscular dysplasia. No evidence of dissection, significant stenosis or significant aneurysm. SMA: Patent without evidence of aneurysm, dissection, vasculitis or significant stenosis. The SMA arises at the level of the initial aneurysmal portion of the abdominal aorta. Renals: The right renal artery arises at the level of the SMA and at the beginning of the aneurysmal segment of the abdominal aorta. The artery is patent without evidence of dissection or fibromuscular dysplasia. The left renal artery arises from the anterior aspect of the most aneurysmal segment of the aorta and appears diffusely diseased. There is a small accessory artery to the lower pole of the left kidney arising from the distal aneurysmal segment of the aorta. IMA: Patent.  Suspect at least mild stenosis at the origin. Inflow: No evidence of dissection or aneurysm involving the iliac arteries. Scattered atherosclerotic plaque. Both internal iliac arteries remain widely patent. The external iliac arteries are relatively spared from disease. The visualized common femoral, superficial femoral and profunda femoral arteries are also relatively spared from disease. Veins: No focal venous abnormality. Review of the MIP images confirms the above findings. NON-VASCULAR Hepatobiliary: No focal liver abnormality is seen. No gallstones, gallbladder wall thickening, or biliary dilatation. Pancreas: Unremarkable. No pancreatic ductal dilatation or surrounding inflammatory changes. Spleen: Normal in size without focal abnormality.  Adrenals/Urinary Tract: Unremarkable adrenal glands. No evidence of hydronephrosis or nephrolithiasis. Mild atrophy of the left kidney likely secondary to chronic arterial insufficiency. 4 cm simple cyst exophytic from the posterior lower pole of the left kidney. 5.4 cm simple cyst exophytic from the upper pole of the right kidney. Unremarkable ureters and bladder. Stomach/Bowel: No evidence of obstruction or focal bowel wall thickening. Normal appendix in the right lower quadrant. The terminal ileum is unremarkable. Lymphatic: No suspicious lymphadenopathy. Reproductive: Mild prostatomegaly. Other: No abdominal wall hernia or abnormality. No abdominopelvic ascites. Musculoskeletal: No acute fracture or aggressive appearing lytic or blastic osseous lesion. Multilevel degenerative disc disease. Review of the MIP images confirms the above findings. IMPRESSION: CTA CHEST 1. No evidence of thoracic aortic dissection or aneurysm. 2. The thoracic aorta is mildly ectatic but likely within normal limits for age at 3.9 cm. 3. Coronary artery calcifications. 4. Very limited evaluation of the lungs due to extensive respiratory motion artifact. However, CT findings suggest underlying pulmonary fibrosis, perhaps related to interstitial lung disease such as usual interstitial pneumonitis. CTA ABD/PELVIS 1. 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 The aneurysmal segment of the aorta is highly tortuous with a zigzag shaped including two nearly 80 degree angulations. No evidence of rupture/impending rupture at this time. Recommend referral to a vascular specialist. This recommendation follows ACR consensus guidelines: White Paper of the ACR Incidental Findings Committee II on Vascular Findings. J Am Coll Radiol 2013; 10:789-794. 2. 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 3.  Mild left renal atrophy likely related to chronic ischemia from renal artery stenosis. 4. Bilateral renal cysts.  5. Prostatomegaly. Signed, Sterling Big, MD, RPVI [facility] Electronically Signed   By: Malachy Moan M.D.   On: 04/03/2021 06:19   VAS US CAROTID  Result Date: 04/04/2021 Carotid Arterial Duplex Study Indications:       CVA. Risk Factors:      Hypertension, hyperlipidemia, no history of smoking. Comparison Study:  No previous exams Performing Technologist: Ernestene Mention  Examination Guidelines: A complete evaluation includes B-mode imaging, spectral Doppler, color Doppler, and power Doppler as needed of all accessible portions of each vessel. Bilateral testing is considered an integral part of a complete examination. Limited examinations for reoccurring indications may be performed as noted.  Right Carotid Findings: +----------+--------+--------+--------+------------------+------------------+           PSV cm/sEDV cm/sStenosisPlaque DescriptionComments           +----------+--------+--------+--------+------------------+------------------+ CCA Prox  55      14                                intimal thickening +----------+--------+--------+--------+------------------+------------------+ CCA Distal67      17                                intimal thickening +----------+--------+--------+--------+------------------+------------------+ ICA Prox  51      15                                                   +----------+--------+--------+--------+------------------+------------------+ ICA Distal55      16                                                   +----------+--------+--------+--------+------------------+------------------+ ECA       69      14                                                   +----------+--------+--------+--------+------------------+------------------+  +----------+--------+-------+----------------+-------------------+           PSV cm/sEDV cmsDescribe        Arm Pressure (mmHG) +----------+--------+-------+----------------+-------------------+ [id number]             Multiphasic, WNL                    +----------+--------+-------+----------------+-------------------+ +---------+--------+--+--------+-+---------+ VertebralPSV cm/s37EDV cm/s8Antegrade +---------+--------+--+--------+-+---------+  Left Carotid Findings: +----------+--------+--------+--------+------------------+------------------+           PSV cm/sEDV cm/sStenosisPlaque DescriptionComments           +----------+--------+--------+--------+------------------+------------------+ CCA Prox  77      18                                intimal thickening +----------+--------+--------+--------+------------------+------------------+ CCA Distal79      20                                intimal  thickening +----------+--------+--------+--------+------------------+------------------+ ICA Prox  46      16                                                   +----------+--------+--------+--------+------------------+------------------+ ICA Distal62      20                                                   +----------+--------+--------+--------+------------------+------------------+ ECA       51      10                                                   +----------+--------+--------+--------+------------------+------------------+ +----------+--------+--------+----------------+-------------------+           PSV cm/sEDV cm/sDescribe        Arm Pressure (mmHG) +----------+--------+--------+----------------+-------------------+ [id number]              Multiphasic, WNL                    +----------+--------+--------+----------------+-------------------+ +---------+--------+--+--------+--+---------+ VertebralPSV cm/s39EDV cm/s11Antegrade  +---------+--------+--+--------+--+---------+   Summary: Right Carotid: The extracranial vessels were near-normal with only minimal wall                thickening or plaque. Left Carotid: The extracranial vessels were near-normal with only minimal wall               thickening or plaque. Vertebrals:  Bilateral vertebral arteries demonstrate antegrade flow. Subclavians: Normal flow hemodynamics were seen in bilateral subclavian              arteries. *See table(s) above for measurements and observations.  Electronically signed by Delia Heady MD on 04/04/2021 at 8:23:52 AM.    Final    VAS [place] LOWER EXTREMITY VENOUS (DVT)  Result Date: 04/04/2021  Lower Venous DVT Study Indications: Stroke.  Comparison Study: No previous exams Performing Technologist: Ernestene Mention  Examination Guidelines: A complete evaluation includes B-mode imaging, spectral Doppler, color Doppler, and power Doppler as needed of all accessible portions of each vessel. Bilateral testing is considered an integral part of a complete examination. Limited examinations for reoccurring indications may be performed as noted. The reflux portion of the exam is performed with the patient in reverse Trendelenburg.  +---------+---------------+---------+-----------+----------+--------------+ RIGHT    CompressibilityPhasicitySpontaneityPropertiesThrombus Aging +---------+---------------+---------+-----------+----------+--------------+ CFV      Full           Yes      Yes                                 +---------+---------------+---------+-----------+----------+--------------+ SFJ      Full                                                        +---------+---------------+---------+-----------+----------+--------------+ FV Prox  Full  Yes      Yes                                 +---------+---------------+---------+-----------+----------+--------------+ FV Mid   Full           Yes      Yes                                  +---------+---------------+---------+-----------+----------+--------------+ FV DistalFull           Yes      Yes                                 +---------+---------------+---------+-----------+----------+--------------+ PFV      Full                                                        +---------+---------------+---------+-----------+----------+--------------+ POP      Full           Yes      Yes                                 +---------+---------------+---------+-----------+----------+--------------+ PTV      Full                                                        +---------+---------------+---------+-----------+----------+--------------+ PERO     Full                                                        +---------+---------------+---------+-----------+----------+--------------+   +---------+---------------+---------+-----------+----------+--------------+ LEFT     CompressibilityPhasicitySpontaneityPropertiesThrombus Aging +---------+---------------+---------+-----------+----------+--------------+ CFV      Full           Yes      Yes                                 +---------+---------------+---------+-----------+----------+--------------+ SFJ      Full                                                        +---------+---------------+---------+-----------+----------+--------------+ FV Prox  Full           Yes      Yes                                 +---------+---------------+---------+-----------+----------+--------------+ FV Mid   Full           Yes      Yes                                 +---------+---------------+---------+-----------+----------+--------------+  FV DistalFull           Yes      Yes                                 +---------+---------------+---------+-----------+----------+--------------+ PFV      Full                                                         +---------+---------------+---------+-----------+----------+--------------+ POP      Full           Yes      Yes                                 +---------+---------------+---------+-----------+----------+--------------+ PTV      Full                                                        +---------+---------------+---------+-----------+----------+--------------+ PERO     Full                                                        +---------+---------------+---------+-----------+----------+--------------+     Summary: BILATERAL: - No evidence of deep vein thrombosis seen in the lower extremities, bilaterally. - No evidence of superficial venous thrombosis in the lower extremities, bilaterally. -No evidence of popliteal cyst, bilaterally.   *See table(s) above for measurements and observations. Electronically signed by Gretta Began MD on 04/04/2021 at 6:00:22 AM.    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  Records reviewed and summated above.  1. Does the need for close, 24 hr/day medical supervision in concert with the patient's rehab needs make it unreasonable for this patient to be served in a less intensive setting? Yes  2. Co-Morbidities requiring supervision/potential complications:  mild dementia, hyperlipidemia (Lipitor), HTN (monitor and provide prns in accordance with increased physical exertion and pain), prediabetes (Monitor in accordance with exercise and adjust meds as necessary), Thrombocytopenia (< 60,000/mm3 no resistive exercise) 3. Due to safety, disease management, medication administration and  patient education, does the patient require 24 hr/day rehab nursing? Yes 4. Does the patient require coordinated care of a physician, rehab nurse, therapy disciplines of PT/OT to address physical and functional deficits in the context of the above medical diagnosis(es)? Yes Addressing deficits in the following areas: balance, endurance, locomotion, strength, transferring, bathing, dressing, toileting and psychosocial support 5. Can the patient actively participate in an intensive therapy program of at least 3 hrs of therapy per day at least 5 days per week? Yes 6. The potential for  patient to make measurable gains while on inpatient rehab is excellent 7. Anticipated functional outcomes upon discharge from inpatient rehab are modified independent and supervision  with PT, modified independent and supervision with OT, n/a with SLP. 8. Estimated rehab length of stay to reach the above functional goals is: 7-10 days. 9. Anticipated discharge destination: Home 10. Overall Rehab/Functional Prognosis: good  RECOMMENDATIONS: This patient's condition is appropriate for continued rehabilitative care in the following setting: CIR patient did not progress to supervision level of functioning Patient has agreed to participate in recommended program. Yes Note that insurance prior authorization may be required for reimbursement for recommended care.  Comment: Rehab Admissions Coordinator to follow up.  I have personally performed a face to face diagnostic evaluation, including, but not limited to relevant history and physical exam findings, of this patient and developed relevant assessment and plan.  Additionally, I have reviewed and concur with the physician assistant's documentation above.   Maryla Morrow, MD, ABPMR Mcarthur Rossetti Angiulli, PA-C 04/04/2021

## 2021-04-04 NOTE — Evaluation (Signed)
Physical Therapy Evaluation Patient Details Name: Maxwell Rollins MRN: [id number] DOB: 1936-03-05 Today's Date: 04/04/2021   History of Present Illness  Pt is an 85 y.o. male who presented 4/4 with bil leg numbness and back pain along with worsening memory issues. 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 MRI of L spine though showed incidental >6cm AAA. PMH: HTN, dementia, and HLD.    Clinical Impression  Pt presents with condition above and deficits mentioned below, see PT Problem List. Per pt as no family members present during eval, he was independent with all functional mobility, ADLs, and driving PTA. He lives in a condo with his wife with their bedroom/bathroom on the 2nd floor, but pt's wife can set-up a day bed downstairs if needed. Other family members also live nearby to provided 24/7 assistance as needed. Pt demonstrates impaired coordination, strength, activity tolerance, and balance that place him at risk for falls. In addition, he has poor awareness into his deficits or body as he was unaware he had a bowel movement in bed. He requires cues to remember to advance the RW during gait as he will continue to ambulate without moving the RW at times. Pt would greatly benefit from intensive therapies in the CIR setting prior to returning home to maximize his independence and safety with all functional mobility and promote return to/near baseline PLOF. Will continue to follow acutely.     Follow Up Recommendations CIR    Equipment Recommendations  Rolling walker with 5" wheels;3in1 (PT) (per pt, he has access to RW and bedside commode)    Recommendations for Other Services Rehab consult     Precautions / Restrictions Precautions Precautions: Fall Restrictions Weight Bearing Restrictions: No      Mobility  Bed Mobility Overal bed mobility: Needs Assistance Bed Mobility: Supine to Sit     Supine to sit: Supervision     General bed mobility comments: for  safety, HOB elevated    Transfers Overall transfer level: Needs assistance Equipment used: Rolling walker (2 wheeled);None Transfers: Sit to/from Stand Sit to Stand: Min assist;Min guard;+2 safety/equipment         General transfer comment: min assist +2 safety from EOB, fading to min guard +2 safety from recliner; initally assist to power up and steady  Ambulation/Gait Ambulation/Gait assistance: Min assist;Min guard;+2 safety/equipment Gait Distance (Feet): 50 Feet Assistive device: Rolling walker (2 wheeled);None Gait Pattern/deviations: Step-through pattern;Decreased step length - right;Decreased step length - left;Decreased stride length;Trunk flexed Gait velocity: reduced Gait velocity interpretation: <1.31 ft/sec, indicative of household ambulator General Gait Details: Pt ambulating with RW initially, needing cues to also advance RW after every 2 steps for safety as he would forget. MinA for steadying and safety initially. Progressed to min guard without UE support, but displayed further decreased bil step length and increased bil knee and ankle instability.  Stairs            Wheelchair Mobility    Modified Rankin (Stroke Patients Only) Modified Rankin (Stroke Patients Only) Pre-Morbid Rankin Score: No significant disability Modified Rankin: Moderate disability     Balance Overall balance assessment: Needs assistance Sitting-balance support: No upper extremity supported;Feet supported Sitting balance-Leahy Scale: Fair     Standing balance support: Bilateral upper extremity supported;No upper extremity supported;During functional activity Standing balance-Leahy Scale: Fair Standing balance comment: preference to BUE support but able to maintain balance dynamically with up to min assist without UE support  Pertinent Vitals/Pain Pain Assessment: No/denies pain    Home Living Family/patient expects to be discharged to::  Private residence Living Arrangements: Spouse/significant other Available Help at Discharge: Family Type of Home: Other(Comment) (Condo) Home Access: Level entry   Entrance Stairs-Number of Steps: 0 Home Layout: Two level;Able to live on main level with bedroom/bathroom;1/2 bath on main level Home Equipment: Crutches;Cane - single point;Shower seat - built in;Grab bars - tub/shower;Grab bars - toilet (access to walker, BSC if needed)      Prior Function Level of Independence: Independent         Comments: spouse completes IADLs, pt drives and completes med mgmt. No use of AD for mobility. Pt reports x1 fall in past 6 months in which he tripped over an object.     Hand Dominance   Dominant Hand: Right    Extremity/Trunk Assessment   Upper Extremity Assessment Upper Extremity Assessment: Defer to OT evaluation    Lower Extremity Assessment Lower Extremity Assessment: RLE deficits/detail (Bil knee and ankle instability noted with standing mobility) RLE Deficits / Details: Minor weakness in R hip flexor (4+/5) compared to L (5/5) RLE Sensation:  (denies numbness/tingling)       Communication   Communication: No difficulties  Cognition Arousal/Alertness: Awake/alert Behavior During Therapy: WFL for tasks assessed/performed Overall Cognitive Status: History of cognitive impairments - at baseline Area of Impairment: Memory;Awareness;Problem solving;Following commands                     Memory: Decreased short-term memory Following Commands: Follows one step commands consistently;Follows one step commands with increased time;Follows multi-step commands inconsistently   Awareness: Emergent Problem Solving: Slow processing;Requires verbal cues;Difficulty sequencing General Comments: pt with hx of dementia, oriented and pleasant.  follows simple commands and able to complete functional ADL tasks but difficulty sequencing multiple step commands, recalling, and poor  awareness (BM in bed with no awareness). Poor sequencing, needing repeated verbal cues to also push RW rather than try to keep walking without advancing it.      General Comments General comments (skin integrity, edema, etc.): CM reports spouse can provide 24/7, can put bed on 1st floor of home and take over meds as needed    Exercises     Assessment/Plan    PT Assessment Patient needs continued PT services  PT Problem List Decreased strength;Decreased activity tolerance;Decreased range of motion;Decreased balance;Decreased mobility;Decreased coordination;Decreased cognition;Decreased knowledge of use of DME;Decreased safety awareness       PT Treatment Interventions DME instruction;Gait training;Stair training;Therapeutic activities;Functional mobility training;Therapeutic exercise;Balance training;Neuromuscular re-education;Cognitive remediation;Patient/family education    PT Goals (Current goals can be found in the Care Plan section)  Acute Rehab PT Goals Patient Stated Goal: home PT Goal Formulation: With patient Time For Goal Achievement: 04/18/21 Potential to Achieve Goals: Good    Frequency Min 4X/week   Barriers to discharge        Co-evaluation PT/OT/SLP Co-Evaluation/Treatment: Yes Reason for Co-Treatment: To address functional/ADL transfers;For patient/therapist safety;Necessary to address cognition/behavior during functional activity PT goals addressed during session: Mobility/safety with mobility;Balance;Proper use of DME OT goals addressed during session: ADL's and self-care       AM-PAC PT "6 Clicks" Mobility  Outcome Measure Help needed turning from your back to your side while in a flat bed without using bedrails?: A Little Help needed moving from lying on your back to sitting on the side of a flat bed without using bedrails?: A Little Help needed moving to and from a bed  to a chair (including a wheelchair)?: A Little Help needed standing up from a chair  using your arms (e.g., wheelchair or bedside chair)?: A Little Help needed to walk in hospital room?: A Little Help needed climbing 3-5 steps with a railing? : A Lot 6 Click Score: 17    End of Session Equipment Utilized During Treatment: Gait belt Activity Tolerance: Patient tolerated treatment well Patient left: in chair;with call bell/phone within reach;with chair alarm set Nurse Communication: Mobility status PT Visit Diagnosis: Unsteadiness on feet (R26.81);Other abnormalities of gait and mobility (R26.89);Muscle weakness (generalized) (M62.81);Difficulty in walking, not elsewhere classified (R26.2)    Time: 0315-9458 PT Time Calculation (min) (ACUTE ONLY): 26 min   Charges:   PT Evaluation $PT Eval Moderate Complexity: 1 Mod          Raymond Gurney, PT, DPT Acute Rehabilitation Services  Pager: [phone] Office: [phone]   Jewel Baize 04/04/2021, 12:21 PM

## 2021-04-04 NOTE — Progress Notes (Signed)
Occupational Therapy Evaluation Patient Details Name: Maxwell Rollins MRN: [id number] DOB: 1936-12-20 Today's Date: 04/04/2021    History of Present Illness Pt is an 85 y.o. male who presented 4/4 with bil leg numbness and back pain along with worsening memory issues. 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 MRI of L spine though showed incidental >6cm AAA. PMH: HTN, dementia, and HLD.   Clinical Impression   PTA patient reports independent and driving, managing meds.  Admitted for above and limited by problem list below, including impaired balance, decreased activity tolerance, impaired coordination and impaired cognition. Patient with hx of dementia, but oriented and following simple commands. Demonstrating decreased awareness to deficits and poor problem solving. Currently requires min assist +2 for transfers and in room mobility, min assist for LB ADLs, max assist for toileting, and min guard for grooming at sink standing.  Believe he will benefit from further OT services at CIR level to optimize independence and safety with ADLs, mobility at CIR level to decrease burden of care and progress to PLOF.     Follow Up Recommendations  Supervision/Assistance - 24 hour;CIR    Equipment Recommendations  3 in 1 bedside commode    Recommendations for Other Services       Precautions / Restrictions Precautions Precautions: Fall Restrictions Weight Bearing Restrictions: No      Mobility Bed Mobility Overal bed mobility: Needs Assistance Bed Mobility: Supine to Sit     Supine to sit: Supervision     General bed mobility comments: for safety, HOB elevated    Transfers Overall transfer level: Needs assistance Equipment used: Rolling walker (2 wheeled);None Transfers: Sit to/from Stand Sit to Stand: Min assist;Min guard;+2 safety/equipment         General transfer comment: min assist +2 safety from EOB, fading to min guard +2 safety from recliner; initally  assist to power up and steady    Balance Overall balance assessment: Needs assistance Sitting-balance support: No upper extremity supported;Feet supported Sitting balance-Leahy Scale: Fair     Standing balance support: Bilateral upper extremity supported;No upper extremity supported;During functional activity Standing balance-Leahy Scale: Fair Standing balance comment: preference to BUE support but able to maintain balance dynamically with up to min assist without UE support                           ADL either performed or assessed with clinical judgement   ADL Overall ADL's : Needs assistance/impaired     Grooming: Min guard;Standing;Wash/dry hands;Wash/dry face   Upper Body Bathing: Set up;Sitting   Lower Body Bathing: Minimal assistance;Sit to/from stand   Upper Body Dressing : Set up;Sitting   Lower Body Dressing: Minimal assistance;Sit to/from stand   Toilet Transfer: Minimal assistance;+2 for safety/equipment;Ambulation;RW Toilet Transfer Details (indicate cue type and reason): simulated in room Toileting- Clothing Manipulation and Hygiene: Maximal assistance;+2 for safety/equipment;Sit to/from stand Toileting - Clothing Manipulation Details (indicate cue type and reason): incontinent BM in bed, pt with no awareness and requires assist for hygiene     Functional mobility during ADLs: Minimal assistance;+2 for safety/equipment;Rolling walker;Cueing for sequencing General ADL Comments: pt limited by balance, cognition, activity tolerance     Vision Baseline Vision/History: Wears glasses Wears Glasses: Reading only Patient Visual Report: No change from baseline Vision Assessment?: No apparent visual deficits     Perception     Praxis      Pertinent Vitals/Pain Pain Assessment: No/denies pain  Hand Dominance Right   Extremity/Trunk Assessment Upper Extremity Assessment Upper Extremity Assessment: Overall WFL for tasks assessed   Lower  Extremity Assessment Lower Extremity Assessment: Defer to PT evaluation       Communication Communication Communication: No difficulties   Cognition Arousal/Alertness: Awake/alert Behavior During Therapy: WFL for tasks assessed/performed Overall Cognitive Status: History of cognitive impairments - at baseline Area of Impairment: Memory;Awareness;Problem solving;Following commands                     Memory: Decreased short-term memory Following Commands: Follows one step commands consistently;Follows one step commands with increased time;Follows multi-step commands inconsistently   Awareness: Emergent Problem Solving: Slow processing;Requires verbal cues;Difficulty sequencing General Comments: pt with hx of dementia, oriented and pleasant.  follows simple commands and able to complete functional ADL tasks but difficutly sequencing multiple step commands, recalling, and poor awareness (BM in bed with no awareness)   General Comments  CM reports spouse can provide 24/7, can put bed on 1st floor of home and take over meds as needed    Exercises     Shoulder Instructions      Home Living Family/patient expects to be discharged to:: Private residence Living Arrangements: Spouse/significant other Available Help at Discharge: Family Type of Home: Other(Comment) (Condo) Home Access: Level entry Entrance Stairs-Number of Steps: 0   Home Layout: Two level;Able to live on main level with bedroom/bathroom;1/2 bath on main level Alternate Level Stairs-Number of Steps: 8 then 6   Bathroom Shower/Tub: Producer, television/film/video: Handicapped height     Home Equipment: Crutches;Cane - single point;Shower seat - built in;Grab bars - tub/shower;Grab bars - toilet (access to walker, BSC if needed)          Prior Functioning/Environment Level of Independence: Independent        Comments: spouse completes IADLs, pt drives and completes med mgmt        OT Problem  List: Decreased activity tolerance;Impaired balance (sitting and/or standing);Decreased cognition;Decreased safety awareness;Decreased knowledge of use of DME or AE;Decreased knowledge of precautions      OT Treatment/Interventions: Self-care/ADL training;DME and/or AE instruction;Energy conservation;Therapeutic activities;Balance training;Patient/family education;Cognitive remediation/compensation    OT Goals(Current goals can be found in the care plan section) Acute Rehab OT Goals Patient Stated Goal: home OT Goal Formulation: With patient Time For Goal Achievement: 04/18/21 Potential to Achieve Goals: Good  OT Frequency: Min 2X/week   Barriers to D/C:            Co-evaluation PT/OT/SLP Co-Evaluation/Treatment: Yes Reason for Co-Treatment: For patient/therapist safety;To address functional/ADL transfers   OT goals addressed during session: ADL's and self-care      AM-PAC OT "6 Clicks" Daily Activity     Outcome Measure Help from another person eating meals?: A Little Help from another person taking care of personal grooming?: A Little Help from another person toileting, which includes using toliet, bedpan, or urinal?: A Lot Help from another person bathing (including washing, rinsing, drying)?: A Little Help from another person to put on and taking off regular upper body clothing?: A Little Help from another person to put on and taking off regular lower body clothing?: A Little 6 Click Score: 17   End of Session Equipment Utilized During Treatment: Gait belt;Rolling walker Nurse Communication: Mobility status  Activity Tolerance: Patient tolerated treatment well Patient left: in chair;with call bell/phone within reach;with chair alarm set  OT Visit Diagnosis: Other abnormalities of gait and mobility (R26.89);Other symptoms and signs  involving cognitive function                Time: 1003-1029 OT Time Calculation (min): 26 min Charges:  OT General Charges $OT Visit: 1  Visit OT Evaluation $OT Eval Moderate Complexity: 1 Mod  Barry Brunner, OT Acute Rehabilitation Services Pager [phone] Office [phone]   Chancy Milroy 04/04/2021, 11:54 AM

## 2021-04-04 NOTE — Evaluation (Signed)
Speech Language Pathology Evaluation Patient Details Name: Maxwell Rollins MRN: [id number] DOB: [birth year] Today's Date: 04/04/2021 Time: 5465-0354 SLP Time Calculation (min) (ACUTE ONLY): 23 min  Problem List:  Patient Active Problem List   Diagnosis Date Noted  . Weakness of both lower extremities   . Essential hypertension   . Prediabetes   . Thrombocytopenia (HCC)   . Acute CVA (cerebrovascular accident) (HCC) 04/03/2021  . Dyslipidemia   . Hypertension   . Dementia [facility])    Past Medical History:  Past Medical History:  Diagnosis Date  . Dementia (HCC)   . Dyslipidemia   . Hypertension    Past Surgical History: History reviewed. No pertinent surgical history. HPI:  Pt is an 85 y.o. male who presented 4/4 with bil leg numbness and back pain along with worsening memory issues. 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 PMH: HTN, dementia, and HLD.   Assessment / Plan / Recommendation Clinical Impression  Pt presents with a more than mild cognitive impairment as evidenced by his score of 9 out of 30 on the SLUMS (21-26 is considered mild). Pt and his family report that he has had difficulty with word-finding PTA but overall he is independent in many ADLs. He also reports that he did well completing a clock drawing task on an assessment last week. However, in this current assessment this was an area of difficulty as he did not completely fill in the hour markers nor did he draw the clock hands. Other areas of difficulty included impaired emergent awareness and selective attention especially when focusing on fine details. He also demonstrated deficits in short-term and working memory, problem solving during a simple monetary task, auditory comprehension for complex commands, and divergent naming. Based on these results, recommend speech therapy for this pt to address these areas of difficulty.     SLP Assessment  SLP Recommendation/Assessment: Patient needs  continued Speech Lanaguage Pathology Services SLP Visit Diagnosis: Cognitive communication deficit (R41.841)    Follow Up Recommendations  Inpatient Rehab    Frequency and Duration min 2x/week  2 weeks      SLP Evaluation Cognition  Overall Cognitive Status: Impaired/Different from baseline Arousal/Alertness: Awake/alert Orientation Level: Oriented to person;Oriented to place;Oriented to time Attention: Selective;Sustained Sustained Attention: Appears intact Sustained Attention Impairment: Verbal basic Selective Attention: Impaired Selective Attention Impairment: Verbal basic Memory: Impaired Memory Impairment: Retrieval deficit;Decreased recall of new information;Decreased short term memory Decreased Short Term Memory: Verbal basic Awareness: Impaired Awareness Impairment: Emergent impairment Problem Solving: Impaired Problem Solving Impairment: Verbal basic Safety/Judgment: Impaired       Comprehension  Auditory Comprehension Overall Auditory Comprehension: Impaired Yes/No Questions: Not tested Commands: Impaired Complex Commands: 25-49% accurate Conversation: Simple Interfering Components: Hearing;Working [employer] fast streamer;Attention EffectiveTechniques: Increased volume;Repetition;Slowed speech    Expression Expression Primary Mode of Expression: Verbal Verbal Expression Overall Verbal Expression: Impaired Naming: Impairment Divergent: Other (comment) (6 words in one minute) Written Expression Dominant Hand: Right   Oral / Motor  Motor Speech Overall Motor Speech: Appears within functional limits for tasks assessed   GO                    Zettie Cooley., SLP Student 04/04/2021, 4:40 PM

## 2021-04-04 NOTE — Progress Notes (Signed)
Rehab Admissions Coordinator Note:  Patient was screened by Clois Dupes for appropriateness for an Inpatient Acute Rehab Consult per therapy recs. .  At this time, we are recommending Inpatient Rehab consult. I will place order per protocol.  Clois Dupes RN MSN 04/04/2021, 1:29 PM  I can be reached at [phone].

## 2021-04-04 NOTE — Progress Notes (Signed)
Initial Nutrition Assessment  DOCUMENTATION CODES:   Not applicable  INTERVENTION:  - will order Ensure Enlive BID, each supplement provides 350 kcal and 20 grams of protein. - will order 1 tablet multivitamin with minerals/day.    NUTRITION DIAGNOSIS:   Inadequate oral intake related to decreased appetite as evidenced by per patient/family report.  GOAL:   Patient will meet greater than or equal to 90% of their needs  MONITOR:   PO intake,Supplement acceptance,Labs,Weight trends  REASON FOR ASSESSMENT:   Consult Assessment of nutrition requirement/status  ASSESSMENT:   85 y.o. male with medical history of HLD, HTN, and dementia. He presented to the ED due to BLE numbness and back pain. He experienced inability to stand after lunch on the day of presentation. His wife reported to ED staff that he has been experiencing memory issues, he is quieter, and not as interactive for several months. His wife also reported that he has had a decreased appetite. His wife also reported that he was on a special diet due to 5 weeks of diarrhea.  No intakes documented since admission. Patient laying in bed with no family or visitors present.   He reports that he had toast and coffee for breakfast and that this is a typical breakfast meal for him. He greatly enjoys toast and all sweets.   He reports decreased appetite for the past 1 year. No abdominal pain/pressure or nausea, no taste changes, no medications changes that he is aware of during this time frame.   His dentures fit well and he has no chewing difficulties when wearing them. No swallowing difficulties or discomfort.   Patient has been feeling weaker over the past few weeks.  Patient is unable to provide any weight-related hx. Weight yesterday was 200 lb and on 4/4 was 203 lb. PTA the most recently documented weight was 213 lb at [facility] on 10/24/18.     Labs reviewed; creatinine: 1.29 mg/dl, Ca: 8.7 mg/dl, GFR: 55 ml/min.   Medications reviewed. IVF; NS @ 50 ml/hr.    NUTRITION - FOCUSED PHYSICAL EXAM:  completed; no muscle depletions, no fat depletions.   Diet Order:   Diet Order            Diet Heart Room service appropriate? Yes; Fluid consistency: Thin  Diet effective now                 EDUCATION NEEDS:   Not appropriate for education at this time  Skin:  Skin Assessment: Reviewed RN Assessment  Last BM:  PTA/unknown  Height:   Ht Readings from Last 1 Encounters:  04/03/21 6\' 2"  (1.88 m)    Weight:   Wt Readings from Last 1 Encounters:  04/03/21 90.6 kg     Estimated Nutritional Needs:  Kcal:  1900-2100 kcal Protein:  95-110 grams Fluid:  >/= 2.2 L/day      06/03/21, MS, RD, LDN, CNSC Inpatient Clinical Dietitian RD pager # available in [facility]  After hours/weekend pager # available in [facility]

## 2021-04-04 NOTE — Progress Notes (Addendum)
  Progress Note    04/04/2021 8:14 AM Hospital Day 1  Subjective:  denies any pain   Vitals:   04/04/21 0319 04/04/21 0700  BP: (!) 157/94 (!) 152/99  Pulse: (!) 58 63  Resp: 18 18  Temp: 97.7 F (36.5 C) 97.8 F (36.6 C)  SpO2: 97% 97%    Physical Exam: General:  No distress Lungs:  Non labored Abdomen:  Soft, NT, aortic pulse is not palpable Extremities:  Bilateral feet are warm and well perfused.  CBC    Component Value Date/Time   WBC 8.2 04/02/2021 1848   RBC 4.75 04/02/2021 1848   HGB 15.0 04/02/2021 1852   HCT 44.0 04/02/2021 1852   PLT 146 (L) 04/02/2021 1848   MCV 91.4 04/02/2021 1848   MCH 29.7 04/02/2021 1848   MCHC 32.5 04/02/2021 1848   RDW 15.2 04/02/2021 1848   LYMPHSABS 1.9 04/02/2021 1848   MONOABS 0.8 04/02/2021 1848   EOSABS 0.5 04/02/2021 1848   BASOSABS 0.1 04/02/2021 1848    BMET    Component Value Date/Time   NA 140 04/02/2021 1852   K 4.2 04/02/2021 1852   CL 103 04/02/2021 1852   CO2 27 04/02/2021 1848   GLUCOSE 100 (H) 04/02/2021 1852   BUN 19 04/02/2021 1852   CREATININE 1.50 (H) 04/02/2021 1852   CALCIUM 8.6 (L) 04/02/2021 1848   GFRNONAA 47 (L) 04/02/2021 1848    INR    Component Value Date/Time   INR 1.1 04/02/2021 1848     Intake/Output Summary (Last 24 hours) at 04/04/2021 0814 Last data filed at 04/04/2021 0600 Gross per 24 hour  Intake 531.1 ml  Output 450 ml  Net 81.1 ml     Assessment/Plan:  85 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  His carotid duplex yesterday is unremarkable.  -pt will need referral to [facility] to see Dr. Pattricia Boss for evaluation of his AAA.   Doreatha Massed, PA-C [facility] [phone] 04/04/2021 8:14 AM  I have examined the patient, reviewed and agree with above.  Alert and oriented.  No issues regarding his incidental finding of distal thoracoabdominal aneurysm.   Again discussed need for outpatient evaluation following rehab stay.  Following from sidelines  Gretta Began, MD 04/04/2021 6:55 PM

## 2021-04-04 NOTE — Progress Notes (Signed)
CSW notified by RN that patient's family asking about power of attorney paperwork. CSW provided paperwork and explained to family, RN or CSW to consult chaplains to witness and notarize when paperwork is completed. Patient and family indicated understanding.  Patient and family also requested that patient's daughter, Prince Rome, be the first contact, and then contact the wife second. CSW rearranged contacts on patient's facesheet to reflect request.  Blenda Nicely, LCSW Clinical Social Worker [phone]

## 2021-04-05 LAB — BASIC METABOLIC PANEL
Anion gap: 3 — ABNORMAL LOW (ref 5–15)
BUN: 18 mg/dL (ref 8–23)
CO2: 29 mmol/L (ref 22–32)
Calcium: 8.4 mg/dL — ABNORMAL LOW (ref 8.9–10.3)
Chloride: 105 mmol/L (ref 98–111)
Creatinine, Ser: 1.36 mg/dL — ABNORMAL HIGH (ref 0.61–1.24)
GFR, Estimated: 52 mL/min — ABNORMAL LOW (ref >60)
Glucose, Bld: 113 mg/dL — ABNORMAL HIGH (ref 70–99)
Potassium: 3.8 mmol/L (ref 3.5–5.1)
Sodium: 137 mmol/L (ref 135–145)

## 2021-04-05 MED ORDER — AMLODIPINE BESYLATE 5 MG PO TABS
5.0000 mg | ORAL_TABLET | Freq: Every day | ORAL | Status: DC
  Administered 2021-04-05 – 2021-04-06 (×2): 5 mg via ORAL
  Filled 2021-04-05 (×2): qty 1

## 2021-04-05 MED ORDER — HYDRALAZINE HCL 25 MG PO TABS: 25.0000 mg | ORAL_TABLET | Freq: Four times a day (QID) | ORAL | Status: DC | PRN

## 2021-04-05 NOTE — Progress Notes (Addendum)
Inpatient Rehabilitation Admissions Coordinator  I met at bedside with patient and then spoke with his daughter, [name], by phone with his permission. I discussed with them goals and expectations of a possible CIR admit. They prefer CIR admit I will follow up tomorrow to assess when patient would be medically ready for D/c to CIR.  Danne Baxter, RN, MSN Rehab Admissions Coordinator [phone] 04/05/2021 2:18 PM   I spoke with daughter and family friend, [name] to answer questions concerning SNF vs CIR.  Danne Baxter, RN, MSN Rehab Admissions Coordinator [phone] 04/05/2021 3:28 PM

## 2021-04-05 NOTE — H&P (Incomplete)
Physical Medicine and Rehabilitation Admission H&P    Chief Complaint  Patient presents with  . Extremity Weakness  : HPI: Maxwell Rollins is an 85 year old [place] right-handed male with history of hyperlipidemia hypertension.  Patient lives with spouse.  Independent prior to admission and still driving.  Two-level home bed and bath main level.  Presented on 04/03/2021 with back pain with associated leg numbness of acute onset.  He was agitated in the ED.  CT/MRI showed single punctate 5 mm acute ischemic nonhemorrhagic infarction involving the mesial left temporal lobe.  MRI of the lumbar spine revealed a 6.2 cm AAA and CT performed revealed trilobed juxtarenal AAA.  CT angio of the chest showed no evidence of thoracic aortic dissection or aneurysm.  MRA was unremarkable.  Patient did not receive TPA.  Admission chemistries unremarkable except creatinine 1.49, urine drug screen negative platelets 146,000.  Echocardiogram with ejection fraction of 60 to 65% grade 1 diastolic dysfunction.  Carotid Dopplers with no ICA stenosis.  Currently maintained on aspirin as well as Plavix for CVA prophylaxis x3 weeks and then aspirin alone.  Venous Doppler studies lower extremities negative for DVT maintained on subcutaneous Lovenox for DVT prophylaxis.  Vascular surgery follow-up in regards to AAA patient remains asymptomatic plan follow-up [facility] to see Dr. Pattricia Boss.  Therapy evaluations completed due to patient decreased functional mobility was admitted for a comprehensive rehab program.  Review of Systems  Constitutional: Negative for fever.       Decreased appetite  HENT: Negative for hearing loss.   Eyes: Negative for blurred vision and double vision.  Respiratory: Negative for cough and shortness of breath.   Cardiovascular: Negative for chest pain, palpitations and leg swelling.  Gastrointestinal: Positive for constipation. Negative for heartburn, nausea and vomiting.  Genitourinary:  Negative for dysuria, flank pain and hematuria.  Musculoskeletal: Positive for back pain, joint pain and myalgias.  Skin: Negative for rash.  Neurological: Positive for dizziness and weakness.  Psychiatric/Behavioral:       Some decrease in memory recall over the past few months  All other systems reviewed and are negative.  Past Medical History:  Diagnosis Date  . Dementia (HCC)   . Dyslipidemia   . Hypertension    History reviewed. No pertinent surgical history. History reviewed. No pertinent family history. Social History:  reports that he has never smoked. He has never used smokeless tobacco. He reports that he does not drink alcohol and does not use drugs. Allergies:  Allergies  Allergen Reactions  . Contrast Media [Iodinated Diagnostic Agents]     Other reaction(s): rash/itching  . Red Dye    Medications Prior to Admission  Medication Sig Dispense Refill  . atorvastatin (LIPITOR) 20 MG tablet Take 20 mg by mouth at bedtime.    . meclizine (ANTIVERT) 25 MG tablet Take 12.5 mg by mouth 3 (three) times daily as needed for dizziness or nausea.    . Pyridoxine HCl (VITAMIN B-6 PO) Take 1 tablet by mouth daily.    . vitamin B-12 (CYANOCOBALAMIN) 1000 MCG tablet Take 1,000 mg by mouth daily.    . vitamin C (ASCORBIC ACID) 500 MG tablet Take 500 mg by mouth daily.    . Vitamin D, Ergocalciferol, (DRISDOL) 1.25 MG (50000 UNIT) CAPS capsule Take 500,000 Units by mouth every Monday.      Drug Regimen Review Drug regimen was reviewed and remains appropriate with no significant issues identified  Home: Home Living Family/patient expects to be discharged to:: Private  residence Living Arrangements: Spouse/significant other Available Help at Discharge: Family Type of Home: Other(Comment) (Condo) Home Access: Level entry Entrance Stairs-Number of Steps: 0 Home Layout: Two level,Able to live on main level with bedroom/bathroom,1/2 bath on main level Alternate Level Stairs-Number of  Steps: 8 then 6 Bathroom Shower/Tub: Health visitor: Handicapped height Home Equipment: Crutches,Cane - single point,Shower seat - built in,Grab bars - tub/shower,Grab bars - toilet (access to walker, BSC if needed)  Lives With: Spouse   Functional History: Prior Function Level of Independence: Independent Comments: spouse completes IADLs, pt drives and completes med mgmt. No use of AD for mobility. Pt reports x1 fall in past 6 months in which he tripped over an object.  Functional Status:  Mobility: Bed Mobility Overal bed mobility: Needs Assistance Bed Mobility: Supine to Sit Supine to sit: Supervision General bed mobility comments: up  in chair Transfers Overall transfer level: Needs assistance Equipment used: Rolling walker (2 wheeled),1 person hand held assist Transfers: Sit to/from Stand Sit to Stand: Mod assist,Max assist General transfer comment: greater assist needed today, possibly from fatigue Ambulation/Gait Ambulation/Gait assistance: Min assist,Min guard,+2 safety/equipment Gait Distance (Feet): 50 Feet Assistive device: Rolling walker (2 wheeled),None Gait Pattern/deviations: Step-through pattern,Decreased step length - right,Decreased step length - left,Decreased stride length,Trunk flexed General Gait Details: pt reports he cannot take a single step despite reattempts Gait velocity: reduced Gait velocity interpretation: <1.31 ft/sec, indicative of household ambulator    ADL: ADL Overall ADL's : Needs assistance/impaired Grooming: Min guard,Standing,Wash/dry hands,Wash/dry face Upper Body Bathing: Set up,Sitting Lower Body Bathing: Minimal assistance,Sit to/from stand Upper Body Dressing : Set up,Sitting Lower Body Dressing: Minimal assistance,Sit to/from stand Toilet Transfer: Minimal assistance,+2 for safety/equipment,Ambulation,RW Toilet Transfer Details (indicate cue type and reason): simulated in room Toileting- Clothing Manipulation  and Hygiene: Maximal assistance,+2 for safety/equipment,Sit to/from stand Toileting - Clothing Manipulation Details (indicate cue type and reason): incontinent BM in bed, pt with no awareness and requires assist for hygiene Functional mobility during ADLs: Minimal assistance,+2 for safety/equipment,Rolling walker,Cueing for sequencing General ADL Comments: pt limited by balance, cognition, activity tolerance  Cognition: Cognition Overall Cognitive Status: Impaired/Different from baseline Arousal/Alertness: Awake/alert Orientation Level: Oriented X4 Attention: Selective,Sustained Sustained Attention: Appears intact Sustained Attention Impairment: Verbal basic Selective Attention: Impaired Selective Attention Impairment: Verbal basic Memory: Impaired Memory Impairment: Retrieval deficit,Decreased recall of new information,Decreased short term memory Decreased Short Term Memory: Verbal basic Awareness: Impaired Awareness Impairment: Emergent impairment Problem Solving: Impaired Problem Solving Impairment: Verbal basic Safety/Judgment: Impaired Cognition Arousal/Alertness: Awake/alert Behavior During Therapy: Flat affect Overall Cognitive Status: Impaired/Different from baseline Area of Impairment: Problem solving,Awareness,Safety/judgement,Following commands,Memory,Attention,Orientation Orientation Level: Situation Current Attention Level: Selective Memory: Decreased recall of precautions,Decreased short-term memory Following Commands: Follows one step commands inconsistently,Follows one step commands with increased time Safety/Judgement: Decreased awareness of deficits,Decreased awareness of safety Awareness: Anticipatory,Intellectual Problem Solving: Slow processing,Requires verbal cues,Requires tactile cues General Comments: generally showing signs of dementia with repetitive instructions and tactile cues needed  Physical Exam: Blood pressure (!) 86/63, pulse 61, temperature  98.2 F (36.8 C), temperature source Oral, resp. rate 16, height  (1.88 m), weight 90.6 kg, SpO2 96 %. Physical Exam Neurological:     Comments: Patient is alert in no acute distress makes eye contact with examiner.  Follows commands.  Provides name and age but some hesitancy and appropriate year.  He does display some decrease in selective attention and awareness.     Results for orders placed or performed during the hospital encounter of 04/02/21 (  from the past 48 hour(s))  CBC     Status: Abnormal   Collection Time: 04/04/21  8:39 AM  Result Value Ref Range   WBC 7.9 4.0 - 10.5 K/uL   RBC 4.66 4.22 - 5.81 MIL/uL   Hemoglobin 13.8 13.0 - 17.0 g/dL   HCT 04.5 40.9 - 81.1 %   MCV 88.8 80.0 - 100.0 fL   MCH 29.6 26.0 - 34.0 pg   MCHC 33.3 30.0 - 36.0 g/dL   RDW 91.4 78.2 - 95.6 %   Platelets 141 (L) 150 - 400 K/uL   nRBC 0.0 0.0 - 0.2 %    Comment: Performed at [facility], [address] [place], [place] [address]  Basic metabolic panel     Status: Abnormal   Collection Time: 04/04/21  8:39 AM  Result Value Ref Range   Sodium 141 135 - 145 mmol/L   Potassium 3.9 3.5 - 5.1 mmol/L   Chloride 106 98 - 111 mmol/L   CO2 28 22 - 32 mmol/L   Glucose, Bld 97 70 - 99 mg/dL    Comment: Glucose reference range applies only to samples taken after fasting for at least 8 hours.   BUN 17 8 - 23 mg/dL   Creatinine, Ser 6.57 (H) 0.61 - 1.24 mg/dL   Calcium 8.7 (L) 8.9 - 10.3 mg/dL   GFR, Estimated 55 (L) >60 mL/min    Comment: (NOTE) Calculated using the CKD-EPI Creatinine Equation (2021)    Anion gap 7 5 - 15    Comment: Performed at [facility], [address], [place], [place] [address]  Magnesium     Status: None   Collection Time: 04/04/21  8:39 AM  Result Value Ref Range   Magnesium 2.1 1.7 - 2.4 mg/dL    Comment: Performed at [facility], [address] [place], [place] [address]  Basic metabolic panel     Status: Abnormal   Collection Time: 04/05/21   4:00 AM  Result Value Ref Range   Sodium 137 135 - 145 mmol/L   Potassium 3.8 3.5 - 5.1 mmol/L   Chloride 105 98 - 111 mmol/L   CO2 29 22 - 32 mmol/L   Glucose, Bld 113 (H) 70 - 99 mg/dL    Comment: Glucose reference range applies only to samples taken after fasting for at least 8 hours.   BUN 18 8 - 23 mg/dL   Creatinine, Ser 4.13 (H) 0.61 - 1.24 mg/dL   Calcium 8.4 (L) 8.9 - 10.3 mg/dL   GFR, Estimated 52 (L) >60 mL/min    Comment: (NOTE) Calculated using the CKD-EPI Creatinine Equation (2021)    Anion gap 3 (L) 5 - 15    Comment: Performed at [facility], [address] [place], [place] [address]   VAS US CAROTID  Result Date: 04/04/2021 Carotid Arterial Duplex Study Indications:       CVA. Risk Factors:      Hypertension, hyperlipidemia, no history of smoking. Comparison Study:  No previous exams Performing Technologist: Ernestene Mention  Examination Guidelines: A complete evaluation includes B-mode imaging, spectral Doppler, color Doppler, and power Doppler as needed of all accessible portions of each vessel. Bilateral testing is considered an integral part of a complete examination. Limited examinations for reoccurring indications may be performed as noted.  Right Carotid Findings: +----------+--------+--------+--------+------------------+------------------+           PSV cm/sEDV cm/sStenosisPlaque DescriptionComments           +----------+--------+--------+--------+------------------+------------------+  CCA Prox  55      14                                intimal thickening +----------+--------+--------+--------+------------------+------------------+ CCA Distal67      17                                intimal thickening +----------+--------+--------+--------+------------------+------------------+ ICA Prox  51      15                                                   +----------+--------+--------+--------+------------------+------------------+ ICA Distal55       16                                                   +----------+--------+--------+--------+------------------+------------------+ ECA       69      14                                                   +----------+--------+--------+--------+------------------+------------------+ +----------+--------+-------+----------------+-------------------+           PSV cm/sEDV cmsDescribe        Arm Pressure (mmHG) +----------+--------+-------+----------------+-------------------+ [id number]             Multiphasic, WNL                    +----------+--------+-------+----------------+-------------------+ +---------+--------+--+--------+-+---------+ VertebralPSV cm/s37EDV cm/s8Antegrade +---------+--------+--+--------+-+---------+  Left Carotid Findings: +----------+--------+--------+--------+------------------+------------------+           PSV cm/sEDV cm/sStenosisPlaque DescriptionComments           +----------+--------+--------+--------+------------------+------------------+ CCA Prox  77      18                                intimal thickening +----------+--------+--------+--------+------------------+------------------+ CCA Distal79      20                                intimal thickening +----------+--------+--------+--------+------------------+------------------+ ICA Prox  46      16                                                   +----------+--------+--------+--------+------------------+------------------+ ICA Distal62      20                                                   +----------+--------+--------+--------+------------------+------------------+ ECA       51      10                                                   +----------+--------+--------+--------+------------------+------------------+ +----------+--------+--------+----------------+-------------------+  PSV cm/sEDV cm/sDescribe        Arm Pressure (mmHG)  +----------+--------+--------+----------------+-------------------+ [id number]              Multiphasic, WNL                    +----------+--------+--------+----------------+-------------------+ +---------+--------+--+--------+--+---------+ VertebralPSV cm/s39EDV cm/s11Antegrade +---------+--------+--+--------+--+---------+   Summary: Right Carotid: The extracranial vessels were near-normal with only minimal wall                thickening or plaque. Left Carotid: The extracranial vessels were near-normal with only minimal wall               thickening or plaque. Vertebrals:  Bilateral vertebral arteries demonstrate antegrade flow. Subclavians: Normal flow hemodynamics were seen in bilateral subclavian              arteries. *See table(s) above for measurements and observations.  Electronically signed by Delia Heady MD on 04/04/2021 at 8:23:52 AM.    Final    VAS [place] LOWER EXTREMITY VENOUS (DVT)  Result Date: 04/04/2021  Lower Venous DVT Study Indications: Stroke.  Comparison Study: No previous exams Performing Technologist: Ernestene Mention  Examination Guidelines: A complete evaluation includes B-mode imaging, spectral Doppler, color Doppler, and power Doppler as needed of all accessible portions of each vessel. Bilateral testing is considered an integral part of a complete examination. Limited examinations for reoccurring indications may be performed as noted. The reflux portion of the exam is performed with the patient in reverse Trendelenburg.  +---------+---------------+---------+-----------+----------+--------------+ RIGHT    CompressibilityPhasicitySpontaneityPropertiesThrombus Aging +---------+---------------+---------+-----------+----------+--------------+ CFV      Full           Yes      Yes                                 +---------+---------------+---------+-----------+----------+--------------+ SFJ      Full                                                         +---------+---------------+---------+-----------+----------+--------------+ FV Prox  Full           Yes      Yes                                 +---------+---------------+---------+-----------+----------+--------------+ FV Mid   Full           Yes      Yes                                 +---------+---------------+---------+-----------+----------+--------------+ FV DistalFull           Yes      Yes                                 +---------+---------------+---------+-----------+----------+--------------+ PFV      Full                                                        +---------+---------------+---------+-----------+----------+--------------+  POP      Full           Yes      Yes                                 +---------+---------------+---------+-----------+----------+--------------+ PTV      Full                                                        +---------+---------------+---------+-----------+----------+--------------+ PERO     Full                                                        +---------+---------------+---------+-----------+----------+--------------+   +---------+---------------+---------+-----------+----------+--------------+ LEFT     CompressibilityPhasicitySpontaneityPropertiesThrombus Aging +---------+---------------+---------+-----------+----------+--------------+ CFV      Full           Yes      Yes                                 +---------+---------------+---------+-----------+----------+--------------+ SFJ      Full                                                        +---------+---------------+---------+-----------+----------+--------------+ FV Prox  Full           Yes      Yes                                 +---------+---------------+---------+-----------+----------+--------------+ FV Mid   Full           Yes      Yes                                  +---------+---------------+---------+-----------+----------+--------------+ FV DistalFull           Yes      Yes                                 +---------+---------------+---------+-----------+----------+--------------+ PFV      Full                                                        +---------+---------------+---------+-----------+----------+--------------+ POP      Full           Yes      Yes                                 +---------+---------------+---------+-----------+----------+--------------+ PTV  Full                                                        +---------+---------------+---------+-----------+----------+--------------+ PERO     Full                                                        +---------+---------------+---------+-----------+----------+--------------+     Summary: BILATERAL: - No evidence of deep vein thrombosis seen in the lower extremities, bilaterally. - No evidence of superficial venous thrombosis in the lower extremities, bilaterally. -No evidence of popliteal cyst, bilaterally.   *See table(s) above for measurements and observations. Electronically signed by Gretta Beganodd Early MD on 04/04/2021 at 6:00:22 AM.    Final        Medical Problem List and Plan: 1.  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  Antithrombotics: -DVT/anticoagulation: Venous Doppler studies negative.  Continue Lovenox  -antiplatelet therapy: Aspirin 81 mg daily and Plavix 75 mg day x3 weeks and aspirin alone 3. Pain Management: Tylenol as needed 4. Mood: Provide emotional support  -antipsychotic agents: N/A 5. Neuropsych: This patient is capable of making decisions on his own behalf. 6. Skin/Wound Care: Routine skin checks 7. Fluids/Electrolytes/Nutrition: Routine in and outs with follow-up chemistries 8.  Hypertension.  Norvasc 5  mg daily.  Monitor with increased mobility 9.  Hyperlipidemia.  Lipitor 10.  AAA.  Incidental finding of aortic aneurysm greater than 6 cm.  [facility]   ***  Mcarthur RossettiDaniel J Angiulli, PA-C 04/05/2021

## 2021-04-05 NOTE — Progress Notes (Signed)
Physical Therapy Treatment Patient Details Name: Maxwell Rollins MRN: [id number] DOB: [birth year] Today's Date: 04/05/2021    History of Present Illness Pt is an 85 y.o. male who presented 4/4 with bil leg numbness and back pain along with worsening memory issues. 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 MRI of L spine though showed incidental >6cm AAA. PMH: HTN, dementia, and HLD.    PT Comments    Pt was seen and upon arrival of PT was up in his recliner.  Pt is weaker and reporting limited sleep, and required much more help to stand and maintain standing balance today.  Pt is concerning in that he cannot take a single step, and was assisted to chair to work on LE strengthening.  Follow along for mobility and strengthening, and work on recovery of gait that was his previous PT session per notes.  Follow up with nursing regarding his struggles to move.   Follow Up Recommendations  CIR     Equipment Recommendations  Rolling walker with 5" wheels;3in1 (PT)    Recommendations for Other Services Rehab consult     Precautions / Restrictions Precautions Precautions: Fall Precaution Comments: repetitive cues for hand placement Restrictions Weight Bearing Restrictions: No    Mobility  Bed Mobility               General bed mobility comments: up  in chair    Transfers Overall transfer level: Needs assistance Equipment used: Rolling walker (2 wheeled);1 person hand held assist Transfers: Sit to/from Stand Sit to Stand: Mod assist;Max assist         General transfer comment: greater assist needed today, possibly from fatigue  Ambulation/Gait         Gait velocity: reduced   General Gait Details: pt reports he cannot take a single step despite reattempts   Stairs             Wheelchair Mobility    Modified Rankin (Stroke Patients Only)       Balance     Sitting balance-Leahy Scale: Fair     Standing balance support:  Bilateral upper extremity supported;No upper extremity supported;During functional activity Standing balance-Leahy Scale: Poor                              Cognition Arousal/Alertness: Awake/alert Behavior During Therapy: Flat affect Overall Cognitive Status: Impaired/Different from baseline Area of Impairment: Problem solving;Awareness;Safety/judgement;Following commands;Memory;Attention;Orientation                 Orientation Level: Situation Current Attention Level: Selective Memory: Decreased recall of precautions;Decreased short-term memory Following Commands: Follows one step commands inconsistently;Follows one step commands with increased time Safety/Judgement: Decreased awareness of deficits;Decreased awareness of safety Awareness: Anticipatory;Intellectual Problem Solving: Slow processing;Requires verbal cues;Requires tactile cues General Comments: generally showing signs of dementia with repetitive instructions and tactile cues needed      Exercises General Exercises - Lower Extremity Ankle Circles/Pumps: AAROM;5 reps Long Arc Quad: AAROM;10 reps Heel Slides: AAROM;10 reps Hip ABduction/ADduction: AAROM;10 reps    General Comments General comments (skin integrity, edema, etc.): pt is much more tired today, reporting poor sleep and is unable to even take one step per his report      Pertinent Vitals/Pain Pain Assessment: Faces Faces Pain Scale: No hurt    Home Living  Prior Function            PT Goals (current goals can now be found in the care plan section) Acute Rehab PT Goals Patient Stated Goal: home    Frequency    Min 4X/week      PT Plan Current plan remains appropriate    Co-evaluation              AM-PAC PT "6 Clicks" Mobility   Outcome Measure  Help needed turning from your back to your side while in a flat bed without using bedrails?: A Little Help needed moving from lying on your  back to sitting on the side of a flat bed without using bedrails?: A Little Help needed moving to and from a bed to a chair (including a wheelchair)?: A Lot Help needed standing up from a chair using your arms (e.g., wheelchair or bedside chair)?: A Lot Help needed to walk in hospital room?: Total Help needed climbing 3-5 steps with a railing? : Total 6 Click Score: 12    End of Session Equipment Utilized During Treatment: Gait belt Activity Tolerance: Patient tolerated treatment well Patient left: in chair;with call bell/phone within reach;with chair alarm set Nurse Communication: Mobility status PT Visit Diagnosis: Unsteadiness on feet (R26.81);Other abnormalities of gait and mobility (R26.89);Muscle weakness (generalized) (M62.81);Difficulty in walking, not elsewhere classified (R26.2)     Time: 9767-3419 PT Time Calculation (min) (ACUTE ONLY): 26 min  Charges:  $Therapeutic Exercise: 8-22 mins $Therapeutic Activity: 8-22 mins                     Ivar Drape 04/05/2021, 9:30 PM Samul Dada, PT MS Acute Rehab Dept. Number: [facility] [id number] and [facility] [phone]

## 2021-04-05 NOTE — Progress Notes (Signed)
PROGRESS NOTE    Maxwell Rollins  GYF:[id number] DOB: 01/22/1936 DOA: 04/02/2021 PCP: Elise Benne, MD   Brief Narrative:  Maxwell Rollins is a 85 y.o. male with medical history significant of HLD; HTN; and dementia presented with B leg numbness and back pain.  He finished eating lunch and tried to stand up and was unable. No dysphagia or dysarthria.  Upon arrival to ED, he was hemodynamically stable.  Patient underwent CT head without contrast which was unremarkable.  Followed by MRI brain without contrast which showed punctate stroke/he was admitted to hospital service, neurology consulted.  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  For which vascular surgery was consulted who recommended outpatient follow-up and elective surgical repair.  Patient was a started on aspirin and Plavix for 3 weeks with instructions to continue only aspirin after that.  Transthoracic echo ruled out PFO.  He was started on statin as well.  He was seen by PT OT who recommended CIR.  Neurology cleared him for discharge and signed off.   Assessment & Plan:   Principal Problem:   Acute CVA (cerebrovascular accident) (HCC) Active Problems:   Dyslipidemia   Hypertension   Dementia (HCC)   Weakness of both lower extremities   Essential hypertension   Prediabetes   Thrombocytopenia (HCC)   Acute small punctate infarct/CVA: Seen by neurology.  Fortunately, patient does not have any focal deficit.  MRI negative for any vascular insufficiency.  Transthoracic echo with no PFO, normal ejection fraction.  Seen by neurology and started on DAPT with aspirin and Plavix for 3 weeks and then aspirin alone.  Seen by PT OT who recommends CIR.  Discussed with patient's wife and daughter who are agreeable with the plan.  Patient's blood pressure was still elevated this morning and since it has been 48  hours since the stroke happened so he was started on amlodipine 5 mg only but at this point in time, his blood pressures on the lower side.  Monitor closely.  He does not have any symptoms.  Hyperlipidemia: LDL 110, goal should be less than 70.  Continue statin.  Prediabetes: Hemoglobin 5.8.  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  Seen by vascular surgery/Dr. Arbie Cookey.  They recommend patient to follow-up with Dr. Pattricia Boss as outpatient and [facility].  They will arrange all that.  Patient's daughter had several questions regarding aneurysm.  I referred her to discuss with vascular surgery directly.  Dementia: Reportedly, patient has demonstrated erratic behaviors and.  Take agitation while in the ED.  According to wife, he has some memory issues lately but despite of this, he drives himself to the charge.  I recommend no driving until cleared by neurology after neurocognitive evaluation.  CKD stage IIIa: Stable.  Monitor.  DVT prophylaxis: enoxaparin (LOVENOX) injection 40 mg Start: 04/03/21 1015   Code Status: Full Code  Family Communication: None present at bedside.   Status is: Inpatient  Remains inpatient appropriate because:Inpatient level of care appropriate due to severity of illness   Dispo: The patient is from: Home              Anticipated d/c is to: CIR              Patient currently is medically stable to d/c.   Difficult to place patient No        Estimated body mass  index is 25.64 kg/m as calculated from the following:   Height as of this encounter:  (1.88 m).   Weight as of this encounter: 90.6 kg.      Nutritional status:  Nutrition Problem: Inadequate oral intake Etiology: decreased appetite   Signs/Symptoms: per patient/family report   Interventions: Ensure Enlive (each supplement provides 350kcal and 20 grams of protein),MVI    Consultants:   Neurology  Procedures:   None  Antimicrobials:  Anti-infectives (From admission,  onward)   None         Subjective: Seen and examined this morning.  Was confused as expected due to his dementia.  He was able to have good conversation though.  He knew that he was in the hospital but did not know that he was in [place].  He thought he was in [place].  He did not have any complaints.  Objective: Vitals:   04/05/21 0318 04/05/21 0700 04/05/21 1212 04/05/21 1522  BP: (!) 160/102 (!) 169/113 (!) 86/63 98/72  Pulse: 67  61 65  Resp: Temp: 98.1 F (36.7 C) 97.6 F (36.4 C) 98.2 F (36.8 C) 98.2 F (36.8 C)  TempSrc: Oral Axillary Oral Oral  SpO2: 96% 97% 96% 97%  Weight:      Height:        Intake/Output Summary (Last 24 hours) at 04/05/2021 1523 Last data filed at 04/05/2021 0353 Gross per 24 hour  Intake --  Output 950 ml  Net -950 ml   Filed Weights   04/02/21 1824 04/03/21 2031  Weight: 92.1 kg 90.6 kg    Examination:  General exam: Appears calm and comfortable  Respiratory system: Clear to auscultation. Respiratory effort normal. Cardiovascular system: S1 & S2 heard, RRR. No JVD, murmurs, rubs, gallops or clicks. No pedal edema. Gastrointestinal system: Abdomen is nondistended, soft and nontender. No organomegaly or masses felt. Normal bowel sounds heard. Central nervous system: Alert and oriented x2. No focal neurological deficits. Extremities: Symmetric 5 x 5 power. Skin: No rashes, lesions or ulcers.  Psychiatry: Judgement and insight appear poor  Data Reviewed: I have personally reviewed following labs and imaging studies  CBC: Recent Labs  Lab 04/02/21 1848 04/02/21 1852 04/04/21 0839  WBC 8.2  --  7.9  NEUTROABS 4.9  --   --   HGB 14.1 15.0 13.8  HCT 43.4 44.0 41.4  MCV 91.4  --  88.8  PLT 146*  --  141*   Basic Metabolic Panel: Recent Labs  Lab 04/02/21 1848 04/02/21 1852 04/04/21 0839 04/05/21 0400  NA 140 140 141 137  K 4.2 4.2 3.9 3.8  CL 106 103 106 105  CO2 27  --  28 29  GLUCOSE 105* 100* 97 113*   BUN CREATININE 1.49* 1.50* 1.29* 1.36*  CALCIUM 8.6*  --  8.7* 8.4*  MG  --   --  2.1  --    GFR: Estimated Creatinine Clearance: 48.7 mL/min (A) (by C-G formula based on SCr of 1.36 mg/dL (H)). Liver Function Tests: Recent Labs  Lab 04/02/21 1848  AST 16  ALT 15  ALKPHOS 93  BILITOT 0.7  PROT 6.2*  ALBUMIN 3.5   No results for input(s): LIPASE, AMYLASE in the last 168 hours. No results for input(s): AMMONIA in the last 168 hours. Coagulation Profile: Recent Labs  Lab 04/02/21 1848  INR 1.1   Cardiac Enzymes: No results for input(s): CKTOTAL, CKMB, CKMBINDEX, TROPONINI in the  last 168 hours. BNP (last 3 results) No results for input(s): PROBNP in the last 8760 hours. HbA1C: Recent Labs    04/03/21 0418  HGBA1C 5.8*   CBG: No results for input(s): GLUCAP in the last 168 hours. Lipid Profile: Recent Labs    04/03/21 0418  CHOL 169  HDL 46  LDLCALC 110*  TRIG 67  CHOLHDL 3.7   Thyroid Function Tests: Recent Labs    04/03/21 0418  TSH 1.239   Anemia Panel: Recent Labs    04/03/21 0418  VITAMINB12 301   Sepsis Labs: No results for input(s): PROCALCITON, LATICACIDVEN in the last 168 hours.  Recent Results (from the past 240 hour(s))  Resp Panel by RT-PCR (Flu A&B, Covid) Nasopharyngeal Swab     Status: None   Collection Time: 04/02/21  6:36 PM   Specimen: Nasopharyngeal Swab; Nasopharyngeal(NP) swabs in vial transport medium  Result Value Ref Range Status   SARS Coronavirus 2 by RT PCR NEGATIVE NEGATIVE Final    Comment: (NOTE) SARS-CoV-2 target nucleic acids are NOT DETECTED.  The SARS-CoV-2 RNA is generally detectable in upper respiratory specimens during the acute phase of infection. The lowest concentration of SARS-CoV-2 viral copies this assay can detect is 138 copies/mL. A negative result does not preclude SARS-Cov-2 infection and should not be used as the sole basis for treatment or other patient management decisions. A  negative result may occur with  improper specimen collection/handling, submission of specimen other than nasopharyngeal swab, presence of viral mutation(s) within the areas targeted by this assay, and inadequate number of viral copies(<138 copies/mL). A negative result must be combined with clinical observations, patient history, and epidemiological information. The expected result is Negative.  Fact Sheet for Patients:  BloggerCourse.com  Fact Sheet for Healthcare Providers:  SeriousBroker.it  This test is no t yet approved or cleared by the [place] FDA and  has been authorized for detection and/or diagnosis of SARS-CoV-2 by FDA under an Emergency Use Authorization (EUA). This EUA will remain  in effect (meaning this test can be used) for the duration of the COVID-19 declaration under Section 564(b)(1) of the Act, 21 U.S.C.section 360bbb-3(b)(1), unless the authorization is terminated  or revoked sooner.       Influenza A by PCR NEGATIVE NEGATIVE Final   Influenza B by PCR NEGATIVE NEGATIVE Final    Comment: (NOTE) The Xpert Xpress SARS-CoV-2/FLU/RSV plus assay is intended as an aid in the diagnosis of influenza from Nasopharyngeal swab specimens and should not be used as a sole basis for treatment. Nasal washings and aspirates are unacceptable for Xpert Xpress SARS-CoV-2/FLU/RSV testing.  Fact Sheet for Patients: BloggerCourse.com  Fact Sheet for Healthcare Providers: SeriousBroker.it  This test is not yet approved or cleared by the [place] FDA and has been authorized for detection and/or diagnosis of SARS-CoV-2 by FDA under an Emergency Use Authorization (EUA). This EUA will remain in effect (meaning this test can be used) for the duration of the COVID-19 declaration under Section 564(b)(1) of the Act, 21 U.S.C. section 360bbb-3(b)(1), unless the authorization  is terminated or revoked.  Performed at [facility], [address], [place], [place] [address]       Radiology Studies: VAS US CAROTID  Result Date: 04/04/2021 Carotid Arterial Duplex Study Indications:       CVA. Risk Factors:      Hypertension, hyperlipidemia, no history of smoking. Comparison Study:  No previous exams Performing Technologist: Ernestene Mention  Examination Guidelines: A complete evaluation includes B-mode imaging,  spectral Doppler, color Doppler, and power Doppler as needed of all accessible portions of each vessel. Bilateral testing is considered an integral part of a complete examination. Limited examinations for reoccurring indications may be performed as noted.  Right Carotid Findings: +----------+--------+--------+--------+------------------+------------------+           PSV cm/sEDV cm/sStenosisPlaque DescriptionComments           +----------+--------+--------+--------+------------------+------------------+ CCA Prox  55      14                                intimal thickening +----------+--------+--------+--------+------------------+------------------+ CCA Distal67      17                                intimal thickening +----------+--------+--------+--------+------------------+------------------+ ICA Prox  51      15                                                   +----------+--------+--------+--------+------------------+------------------+ ICA Distal55      16                                                   +----------+--------+--------+--------+------------------+------------------+ ECA       69      14                                                   +----------+--------+--------+--------+------------------+------------------+ +----------+--------+-------+----------------+-------------------+           PSV cm/sEDV cmsDescribe        Arm Pressure (mmHG) +----------+--------+-------+----------------+-------------------+  [id number]             Multiphasic, WNL                    +----------+--------+-------+----------------+-------------------+ +---------+--------+--+--------+-+---------+ VertebralPSV cm/s37EDV cm/s8Antegrade +---------+--------+--+--------+-+---------+  Left Carotid Findings: +----------+--------+--------+--------+------------------+------------------+           PSV cm/sEDV cm/sStenosisPlaque DescriptionComments           +----------+--------+--------+--------+------------------+------------------+ CCA Prox  77      18                                intimal thickening +----------+--------+--------+--------+------------------+------------------+ CCA Distal79      20                                intimal thickening +----------+--------+--------+--------+------------------+------------------+ ICA Prox  46      16                                                   +----------+--------+--------+--------+------------------+------------------+ ICA Distal62      20                                                   +----------+--------+--------+--------+------------------+------------------+  ECA       51      10                                                   +----------+--------+--------+--------+------------------+------------------+ +----------+--------+--------+----------------+-------------------+           PSV cm/sEDV cm/sDescribe        Arm Pressure (mmHG) +----------+--------+--------+----------------+-------------------+ [id number]              Multiphasic, WNL                    +----------+--------+--------+----------------+-------------------+ +---------+--------+--+--------+--+---------+ VertebralPSV cm/s39EDV cm/s11Antegrade +---------+--------+--+--------+--+---------+   Summary: Right Carotid: The extracranial vessels were near-normal with only minimal wall                thickening or plaque. Left Carotid: The extracranial  vessels were near-normal with only minimal wall               thickening or plaque. Vertebrals:  Bilateral vertebral arteries demonstrate antegrade flow. Subclavians: Normal flow hemodynamics were seen in bilateral subclavian              arteries. *See table(s) above for measurements and observations.  Electronically signed by Delia Heady MD on 04/04/2021 at 8:23:52 AM.    Final    VAS [place] LOWER EXTREMITY VENOUS (DVT)  Result Date: 04/04/2021  Lower Venous DVT Study Indications: Stroke.  Comparison Study: No previous exams Performing Technologist: Ernestene Mention  Examination Guidelines: A complete evaluation includes B-mode imaging, spectral Doppler, color Doppler, and power Doppler as needed of all accessible portions of each vessel. Bilateral testing is considered an integral part of a complete examination. Limited examinations for reoccurring indications may be performed as noted. The reflux portion of the exam is performed with the patient in reverse Trendelenburg.  +---------+---------------+---------+-----------+----------+--------------+ RIGHT    CompressibilityPhasicitySpontaneityPropertiesThrombus Aging +---------+---------------+---------+-----------+----------+--------------+ CFV      Full           Yes      Yes                                 +---------+---------------+---------+-----------+----------+--------------+ SFJ      Full                                                        +---------+---------------+---------+-----------+----------+--------------+ FV Prox  Full           Yes      Yes                                 +---------+---------------+---------+-----------+----------+--------------+ FV Mid   Full           Yes      Yes                                 +---------+---------------+---------+-----------+----------+--------------+ FV DistalFull           Yes      Yes                                  +---------+---------------+---------+-----------+----------+--------------+  PFV      Full                                                        +---------+---------------+---------+-----------+----------+--------------+ POP      Full           Yes      Yes                                 +---------+---------------+---------+-----------+----------+--------------+ PTV      Full                                                        +---------+---------------+---------+-----------+----------+--------------+ PERO     Full                                                        +---------+---------------+---------+-----------+----------+--------------+   +---------+---------------+---------+-----------+----------+--------------+ LEFT     CompressibilityPhasicitySpontaneityPropertiesThrombus Aging +---------+---------------+---------+-----------+----------+--------------+ CFV      Full           Yes      Yes                                 +---------+---------------+---------+-----------+----------+--------------+ SFJ      Full                                                        +---------+---------------+---------+-----------+----------+--------------+ FV Prox  Full           Yes      Yes                                 +---------+---------------+---------+-----------+----------+--------------+ FV Mid   Full           Yes      Yes                                 +---------+---------------+---------+-----------+----------+--------------+ FV DistalFull           Yes      Yes                                 +---------+---------------+---------+-----------+----------+--------------+ PFV      Full                                                        +---------+---------------+---------+-----------+----------+--------------+  POP      Full           Yes      Yes                                  +---------+---------------+---------+-----------+----------+--------------+ PTV      Full                                                        +---------+---------------+---------+-----------+----------+--------------+ PERO     Full                                                        +---------+---------------+---------+-----------+----------+--------------+     Summary: BILATERAL: - No evidence of deep vein thrombosis seen in the lower extremities, bilaterally. - No evidence of superficial venous thrombosis in the lower extremities, bilaterally. -No evidence of popliteal cyst, bilaterally.   *See table(s) above for measurements and observations. Electronically signed by Gretta Began MD on 04/04/2021 at 6:00:22 AM.    Final     Scheduled Meds: . amLODipine  5 mg Oral Daily  . aspirin EC  81 mg Oral Daily  . atorvastatin  40 mg Oral QHS  . clopidogrel  75 mg Oral Daily  . enoxaparin (LOVENOX) injection  40 mg Subcutaneous Q24H  . feeding supplement  237 mL Oral BID BM  . multivitamin with minerals  1 tablet Oral Daily   Continuous Infusions: . sodium chloride 50 mL/hr at 04/04/21 1703     LOS: 2 days   Time spent: 30 minutes   Hughie Closs, MD [facility]  04/05/2021, 3:23 PM   To contact the attending provider between 7A-7P or the covering provider during after hours 7P-7A, please log into the web site [email or web address].

## 2021-04-06 ENCOUNTER — Encounter (HOSPITAL_COMMUNITY): Payer: Self-pay | Admitting: Physical Medicine & Rehabilitation

## 2021-04-06 ENCOUNTER — Inpatient Hospital Stay (HOSPITAL_COMMUNITY)
Admission: RE | Admit: 2021-04-06 | Discharge: 2021-04-24 | DRG: 057 | Disposition: A | Discharge status: Skilled Nursing Facility | Payer: Medicare Other | Source: Intra-hospital | Attending: Physical Medicine & Rehabilitation | Admitting: Physical Medicine & Rehabilitation

## 2021-04-06 ENCOUNTER — Encounter (HOSPITAL_COMMUNITY): Payer: Self-pay | Admitting: Internal Medicine

## 2021-04-06 DIAGNOSIS — N1831 Chronic kidney disease, stage 3a: Secondary | ICD-10-CM | POA: Diagnosis present

## 2021-04-06 DIAGNOSIS — I6931 Attention and concentration deficit following cerebral infarction: Secondary | ICD-10-CM | POA: Diagnosis not present

## 2021-04-06 DIAGNOSIS — F05 Delirium due to known physiological condition: Secondary | ICD-10-CM | POA: Diagnosis not present

## 2021-04-06 DIAGNOSIS — Z741 Need for assistance with personal care: Secondary | ICD-10-CM | POA: Diagnosis present

## 2021-04-06 DIAGNOSIS — R4189 Other symptoms and signs involving cognitive functions and awareness: Secondary | ICD-10-CM | POA: Diagnosis present

## 2021-04-06 DIAGNOSIS — I129 Hypertensive chronic kidney disease with stage 1 through stage 4 chronic kidney disease, or unspecified chronic kidney disease: Secondary | ICD-10-CM | POA: Diagnosis present

## 2021-04-06 DIAGNOSIS — F0390 Unspecified dementia without behavioral disturbance: Secondary | ICD-10-CM | POA: Diagnosis present

## 2021-04-06 DIAGNOSIS — E785 Hyperlipidemia, unspecified: Secondary | ICD-10-CM | POA: Diagnosis present

## 2021-04-06 DIAGNOSIS — I69398 Other sequelae of cerebral infarction: Secondary | ICD-10-CM | POA: Diagnosis present

## 2021-04-06 DIAGNOSIS — Z8616 Personal history of COVID-19: Secondary | ICD-10-CM

## 2021-04-06 DIAGNOSIS — Z8249 Family history of ischemic heart disease and other diseases of the circulatory system: Secondary | ICD-10-CM

## 2021-04-06 DIAGNOSIS — I69318 Other symptoms and signs involving cognitive functions following cerebral infarction: Secondary | ICD-10-CM

## 2021-04-06 DIAGNOSIS — I639 Cerebral infarction, unspecified: Secondary | ICD-10-CM | POA: Diagnosis not present

## 2021-04-06 DIAGNOSIS — I714 Abdominal aortic aneurysm, without rupture, unspecified: Secondary | ICD-10-CM | POA: Diagnosis present

## 2021-04-06 DIAGNOSIS — Z91048 Other nonmedicinal substance allergy status: Secondary | ICD-10-CM | POA: Diagnosis not present

## 2021-04-06 DIAGNOSIS — R7303 Prediabetes: Secondary | ICD-10-CM | POA: Diagnosis present

## 2021-04-06 DIAGNOSIS — I10 Essential (primary) hypertension: Secondary | ICD-10-CM | POA: Diagnosis present

## 2021-04-06 DIAGNOSIS — R490 Dysphonia: Secondary | ICD-10-CM | POA: Diagnosis present

## 2021-04-06 DIAGNOSIS — I712 Thoracic aortic aneurysm, without rupture, unspecified: Secondary | ICD-10-CM

## 2021-04-06 DIAGNOSIS — D696 Thrombocytopenia, unspecified: Secondary | ICD-10-CM | POA: Diagnosis present

## 2021-04-06 DIAGNOSIS — M48061 Spinal stenosis, lumbar region without neurogenic claudication: Secondary | ICD-10-CM | POA: Diagnosis present

## 2021-04-06 DIAGNOSIS — Z79899 Other long term (current) drug therapy: Secondary | ICD-10-CM | POA: Diagnosis not present

## 2021-04-06 DIAGNOSIS — N189 Chronic kidney disease, unspecified: Secondary | ICD-10-CM

## 2021-04-06 DIAGNOSIS — Z87891 Personal history of nicotine dependence: Secondary | ICD-10-CM | POA: Diagnosis not present

## 2021-04-06 DIAGNOSIS — Z91041 Radiographic dye allergy status: Secondary | ICD-10-CM | POA: Diagnosis not present

## 2021-04-06 DIAGNOSIS — R32 Unspecified urinary incontinence: Secondary | ICD-10-CM | POA: Diagnosis not present

## 2021-04-06 DIAGNOSIS — Z20822 Contact with and (suspected) exposure to covid-19: Secondary | ICD-10-CM | POA: Diagnosis present

## 2021-04-06 DIAGNOSIS — R278 Other lack of coordination: Secondary | ICD-10-CM | POA: Diagnosis present

## 2021-04-06 DIAGNOSIS — I69311 Memory deficit following cerebral infarction: Secondary | ICD-10-CM

## 2021-04-06 DIAGNOSIS — I7143 Infrarenal abdominal aortic aneurysm, without rupture: Secondary | ICD-10-CM

## 2021-04-06 DIAGNOSIS — N179 Acute kidney failure, unspecified: Secondary | ICD-10-CM | POA: Diagnosis not present

## 2021-04-06 MED ORDER — AMLODIPINE BESYLATE 10 MG PO TABS: 10.0000 mg | ORAL_TABLET | Freq: Every day | ORAL | 0 refills | Status: DC

## 2021-04-06 MED ORDER — POLYETHYLENE GLYCOL 3350 17 G PO PACK
17.0000 g | PACK | Freq: Every day | ORAL | Status: DC | PRN
Start: 2021-04-06 — End: 2021-04-24

## 2021-04-06 MED ORDER — ACETAMINOPHEN 325 MG PO TABS: 325.0000 mg | ORAL_TABLET | ORAL | Status: DC | PRN

## 2021-04-06 MED ORDER — ASPIRIN EC 81 MG PO TBEC
81.0000 mg | DELAYED_RELEASE_TABLET | Freq: Every day | ORAL | Status: DC
  Administered 2021-04-07 – 2021-04-24 (×18): 81 mg via ORAL
  Filled 2021-04-06 (×17): qty 1

## 2021-04-06 MED ORDER — ATORVASTATIN CALCIUM 40 MG PO TABS
40.0000 mg | ORAL_TABLET | Freq: Every day | ORAL | Status: DC
  Administered 2021-04-06 – 2021-04-23 (×18): 40 mg via ORAL
  Filled 2021-04-06 (×19): qty 1

## 2021-04-06 MED ORDER — TRAZODONE HCL 50 MG PO TABS
25.0000 mg | ORAL_TABLET | Freq: Every evening | ORAL | Status: DC | PRN
  Administered 2021-04-09: 50 mg via ORAL
  Filled 2021-04-06: qty 1

## 2021-04-06 MED ORDER — ENSURE ENLIVE PO LIQD
237.0000 mL | Freq: Two times a day (BID) | ORAL | Status: DC
  Administered 2021-04-06 – 2021-04-24 (×34): 237 mL via ORAL
  Filled 2021-04-06 (×3): qty 237

## 2021-04-06 MED ORDER — ASCORBIC ACID 500 MG PO TABS
500.0000 mg | ORAL_TABLET | Freq: Every day | ORAL | Status: DC
  Administered 2021-04-06 – 2021-04-24 (×19): 500 mg via ORAL
  Filled 2021-04-06 (×19): qty 1

## 2021-04-06 MED ORDER — ADULT MULTIVITAMIN W/MINERALS CH
1.0000 | ORAL_TABLET | Freq: Every day | ORAL | Status: DC
  Administered 2021-04-07 – 2021-04-24 (×18): 1 via ORAL
  Filled 2021-04-06 (×18): qty 1

## 2021-04-06 MED ORDER — HYDRALAZINE HCL 25 MG PO TABS: 25.0000 mg | ORAL_TABLET | Freq: Four times a day (QID) | ORAL | Status: DC | PRN

## 2021-04-06 MED ORDER — ALUM & MAG HYDROXIDE-SIMETH 200-200-20 MG/5ML PO SUSP: 30.0000 mL | ORAL | Status: DC | PRN

## 2021-04-06 MED ORDER — BLOOD PRESSURE CONTROL BOOK
Freq: Once | Status: AC
  Filled 2021-04-06: qty 1

## 2021-04-06 MED ORDER — CLOPIDOGREL BISULFATE 75 MG PO TABS: 75.0000 mg | ORAL_TABLET | Freq: Every day | ORAL | 0 refills | Status: AC

## 2021-04-06 MED ORDER — ASPIRIN 81 MG PO TBEC: 81.0000 mg | DELAYED_RELEASE_TABLET | Freq: Every day | ORAL | 0 refills | Status: DC

## 2021-04-06 MED ORDER — FLEET ENEMA 7-19 GM/118ML RE ENEM: 1.0000 | ENEMA | Freq: Once | RECTAL | Status: DC | PRN

## 2021-04-06 MED ORDER — PROCHLORPERAZINE EDISYLATE 10 MG/2ML IJ SOLN: 5.0000 mg | Freq: Four times a day (QID) | INTRAMUSCULAR | Status: DC | PRN

## 2021-04-06 MED ORDER — CLOPIDOGREL BISULFATE 75 MG PO TABS
75.0000 mg | ORAL_TABLET | Freq: Every day | ORAL | Status: DC
  Administered 2021-04-07 – 2021-04-24 (×18): 75 mg via ORAL
  Filled 2021-04-06 (×17): qty 1

## 2021-04-06 MED ORDER — BISACODYL 10 MG RE SUPP: 10.0000 mg | Freq: Every day | RECTAL | Status: DC | PRN

## 2021-04-06 MED ORDER — PROCHLORPERAZINE MALEATE 5 MG PO TABS
5.0000 mg | ORAL_TABLET | Freq: Four times a day (QID) | ORAL | Status: DC | PRN
Start: 2021-04-06 — End: 2021-04-24

## 2021-04-06 MED ORDER — ENOXAPARIN SODIUM 40 MG/0.4ML ~~LOC~~ SOLN
40.0000 mg | SUBCUTANEOUS | Status: DC
  Administered 2021-04-07 – 2021-04-24 (×18): 40 mg via SUBCUTANEOUS
  Filled 2021-04-06 (×18): qty 0.4

## 2021-04-06 MED ORDER — PROCHLORPERAZINE 25 MG RE SUPP: 12.5000 mg | Freq: Four times a day (QID) | RECTAL | Status: DC | PRN

## 2021-04-06 MED ORDER — GUAIFENESIN-DM 100-10 MG/5ML PO SYRP: 5.0000 mL | ORAL_SOLUTION | Freq: Four times a day (QID) | ORAL | Status: DC | PRN

## 2021-04-06 MED ORDER — MECLIZINE HCL 25 MG PO TABS: 12.5000 mg | ORAL_TABLET | Freq: Three times a day (TID) | ORAL | Status: DC | PRN

## 2021-04-06 MED ORDER — DIPHENHYDRAMINE HCL 12.5 MG/5ML PO ELIX
12.5000 mg | ORAL_SOLUTION | Freq: Four times a day (QID) | ORAL | Status: DC | PRN
  Filled 2021-04-06: qty 10

## 2021-04-06 MED ORDER — AMLODIPINE BESYLATE 5 MG PO TABS: 5.0000 mg | ORAL_TABLET | Freq: Every day | ORAL | Status: DC

## 2021-04-06 MED ORDER — ATORVASTATIN CALCIUM 40 MG PO TABS: 40.0000 mg | ORAL_TABLET | Freq: Every day | ORAL | 0 refills | Status: AC

## 2021-04-06 NOTE — Progress Notes (Signed)
Patient ID: Maxwell Rollins, male   DOB: [birth year], 85 y.o.   MRN: [id number] Resting comfortably.   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  Explained this is an incidental finding.  My office will coordinate follow-up with Dr. Pattricia Boss post discharge from the hospital.  Will need to have a copy of his CT scan on his visit with Dr. Pattricia Boss

## 2021-04-06 NOTE — H&P (Signed)
Physical Medicine and Rehabilitation Admission H&P    Chief Complaint  Patient presents with  . Functional deficits    HPI: Maxwell Rollins is an 85 year old male with history of dyslipidemia, Covid 19 2021 with resultant dizziness/balance problems, 3 week history of weakness who was admitted on 04/02/2021 with B/l LE numbness with reports of  back pain and inability to walk. He was noted to have some confusion with ?history of dementia. UDS negative. MRI/MRA brain done revealing punctate infarct in mesial left temporal lobe and moderately advanced age related cerebral atrophy and mild chronic small vessel disease. 2D echocardiogram with EF of 60-65%, no wall abnormalities. Stroke felt to be due to small vessel disease. Dr Roda Shutters recommended DAPT X 3 weeks followed by ASA alone and that symptoms due to lumbar stenosis.   MRI lumbar spine multifactorial degenerative changes in L4-5 with mild spinal stenosis and moderate R>L L4 foraminal stenosis and L5 on S1 anterolisthesis with severe right >left facet arthrosis and moderate right L5 foraminal narrowing and large multi lobulated aneurysm.  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 Dr. Arbie Cookey consulted for input and will follow along. Patient will need consideration for elected repair and would need eventual referral to [facility] (Dr. Pattricia Boss) for off label graft Tx or potential open repair. He continues to have problems with mobility and therapy is working on pregait activity. Please see preadmission assessment from earlier today as well. .   Review of Systems  Constitutional: Negative for chills and fever.  HENT: Negative for hearing loss.   Eyes: Negative for blurred vision and double vision.  Respiratory: Negative for cough and shortness of breath.   Cardiovascular: Negative for chest pain and  palpitations.  Gastrointestinal: Negative for abdominal pain and heartburn.  Genitourinary: Negative for dysuria and urgency.  Musculoskeletal: Negative for joint pain, myalgias and neck pain.  Skin: Negative for itching.  Neurological: Negative for dizziness.  All other systems reviewed and are negative.   Past Medical History:  Diagnosis Date  . Dementia (HCC)   . Dyslipidemia   . Hypertension     Past Surgical History:  Procedure Laterality Date  . NASAL POLYP EXCISION     Surgeries X 5 for removal     Family History  Problem Relation Age of Onset  . Heart disease Mother      Social History:  Married. 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  Goes to the gym twice a week--does bike/treadmill/weights X 1 hour. Marland Kitchen He reports that he quit smoking 250 years ago---smoked "too many years".  He has never used smokeless tobacco. He reports that he drinks beer or mixed drink "here and there" He dose not use drugs.    Allergies  Allergen Reactions  . Contrast Media [Iodinated Diagnostic Agents]     Other reaction(s): rash/itching  . Red Dye     Medications Prior to Admission  Medication Sig Dispense Refill  . atorvastatin (LIPITOR) 20 MG tablet Take 20 mg by mouth at bedtime.    . meclizine (ANTIVERT) 25 MG tablet Take 12.5 mg by mouth 3 (three) times daily as needed for dizziness or nausea.    . Pyridoxine HCl (VITAMIN B-6 PO) Take 1 tablet by mouth daily.    . vitamin B-12 (CYANOCOBALAMIN) 1000 MCG tablet Take 1,000 mg by mouth daily.    Marland Kitchen  vitamin C (ASCORBIC ACID) 500 MG tablet Take 500 mg by mouth daily.    . Vitamin D, Ergocalciferol, (DRISDOL) 1.25 MG (50000 UNIT) CAPS capsule Take 500,000 Units by mouth every Monday.      Drug Regimen Review  Drug regimen was reviewed and remains appropriate with no significant issues identified  Home: Home Living Family/patient expects to be discharged to:: Private residence Living  Arrangements: Spouse/significant other Available Help at Discharge: Family,Available 24 hours/day Type of Home:  (townhome) Home Access: Level entry Entrance Stairs-Number of Steps: 0 Home Layout: Two level,Able to live on main level with bedroom/bathroom,1/2 bath on main level Alternate Level Stairs-Number of Steps: 8 then 6 Bathroom Shower/Tub: Health visitor: Handicapped height Bathroom Accessibility: Yes Home Equipment: Crutches,Cane - single point,Shower seat - built in,Grab bars - tub/shower,Grab bars - toilet (access to walker, BSC if needed)  Lives With: Spouse   Functional History: Prior Function Level of Independence: Independent Comments: patient independent with all adls  Functional Status:  Mobility: Bed Mobility Overal bed mobility: Needs Assistance Bed Mobility: Supine to Sit Supine to sit: Supervision General bed mobility comments: up  in chair Transfers Overall transfer level: Needs assistance Equipment used: Rolling walker (2 wheeled),1 person hand held assist Transfers: Sit to/from Stand Sit to Stand: Mod assist,Max assist General transfer comment: greater assist needed today, possibly from fatigue Ambulation/Gait Ambulation/Gait assistance: Min assist,Min guard,+2 safety/equipment Gait Distance (Feet): 50 Feet Assistive device: Rolling walker (2 wheeled),None Gait Pattern/deviations: Step-through pattern,Decreased step length - right,Decreased step length - left,Decreased stride length,Trunk flexed General Gait Details: pt reports he cannot take a single step despite reattempts Gait velocity: reduced Gait velocity interpretation: <1.31 ft/sec, indicative of household ambulator    ADL: ADL Overall ADL's : Needs assistance/impaired Grooming: Min guard,Standing,Wash/dry hands,Wash/dry face Upper Body Bathing: Set up,Sitting Lower Body Bathing: Minimal assistance,Sit to/from stand Upper Body Dressing : Set up,Sitting Lower Body Dressing:  Minimal assistance,Sit to/from stand Toilet Transfer: Minimal assistance,+2 for safety/equipment,Ambulation,RW Toilet Transfer Details (indicate cue type and reason): simulated in room Toileting- Clothing Manipulation and Hygiene: Maximal assistance,+2 for safety/equipment,Sit to/from stand Toileting - Clothing Manipulation Details (indicate cue type and reason): incontinent BM in bed, pt with no awareness and requires assist for hygiene Functional mobility during ADLs: Minimal assistance,+2 for safety/equipment,Rolling walker,Cueing for sequencing General ADL Comments: pt limited by balance, cognition, activity tolerance  Cognition: Cognition Overall Cognitive Status: Impaired/Different from baseline Arousal/Alertness: Awake/alert Orientation Level: Oriented X4 Attention: Selective,Sustained Sustained Attention: Appears intact Sustained Attention Impairment: Verbal basic Selective Attention: Impaired Selective Attention Impairment: Verbal basic Memory: Impaired Memory Impairment: Retrieval deficit,Decreased recall of new information,Decreased short term memory Decreased Short Term Memory: Verbal basic Awareness: Impaired Awareness Impairment: Emergent impairment Problem Solving: Impaired Problem Solving Impairment: Verbal basic Safety/Judgment: Impaired Cognition Arousal/Alertness: Awake/alert Behavior During Therapy: Flat affect Overall Cognitive Status: Impaired/Different from baseline Area of Impairment: Problem solving,Awareness,Safety/judgement,Following commands,Memory,Attention,Orientation Orientation Level: Situation Current Attention Level: Selective Memory: Decreased recall of precautions,Decreased short-term memory Following Commands: Follows one step commands inconsistently,Follows one step commands with increased time Safety/Judgement: Decreased awareness of deficits,Decreased awareness of safety Awareness: Anticipatory,Intellectual Problem Solving: Slow  processing,Requires verbal cues,Requires tactile cues General Comments: generally showing signs of dementia with repetitive instructions and tactile cues needed  Physical Exam: Blood pressure (!) 139/103, pulse 82, temperature 98.2 F (36.8 C), temperature source Oral, resp. rate 18, height 6\' 2"  (1.88 m), weight 90.6 kg, SpO2 96 %. Physical Exam Vitals reviewed.  Constitutional:      General: He is not  in acute distress.    Appearance: He is not ill-appearing.  HENT:     Head: Normocephalic and atraumatic.     Right Ear: External ear normal.     Left Ear: External ear normal.     Nose: Nose normal.  Eyes:     General:        Right eye: No discharge.        Left eye: No discharge.     Extraocular Movements: Extraocular movements intact.  Cardiovascular:     Rate and Rhythm: Normal rate and regular rhythm.  Pulmonary:     Effort: Pulmonary effort is normal. No respiratory distress.     Breath sounds: Normal breath sounds. No stridor.  Abdominal:     General: Abdomen is flat. Bowel sounds are normal. There is no distension.  Musculoskeletal:     Cervical back: Normal range of motion and neck supple.     Comments: No edema or tenderness in extremities  Skin:    General: Skin is warm and dry.  Neurological:     Mental Status: He is alert.     Comments: Alert HOH Motor: Bilateral upper extremities: 5/5 proximal distal Bilateral lower extremities: 4+/5 hip flexion, knee extension, 5/5 ankle dorsiflexion, improving Right upper extremity: mild dysmetria  Psychiatric:        Mood and Affect: Mood normal.        Behavior: Behavior normal.     Results for orders placed or performed during the hospital encounter of 04/02/21 (from the past 48 hour(s))  Basic metabolic panel     Status: Abnormal   Collection Time: 04/05/21  4:00 AM  Result Value Ref Range   Sodium 137 135 - 145 mmol/L   Potassium 3.8 3.5 - 5.1 mmol/L   Chloride 105 98 - 111 mmol/L   CO2 29 22 - 32 mmol/L    Glucose, Bld 113 (H) 70 - 99 mg/dL    Comment: Glucose reference range applies only to samples taken after fasting for at least 8 hours.   BUN 18 8 - 23 mg/dL   Creatinine, Ser 2.70 (H) 0.61 - 1.24 mg/dL   Calcium 8.4 (L) 8.9 - 10.3 mg/dL   GFR, Estimated 52 (L) >60 mL/min    Comment: (NOTE) Calculated using the CKD-EPI Creatinine Equation (2021)    Anion gap 3 (L) 5 - 15    Comment: Performed at [facility], [address] [place], [place] [address]   No results found.     Medical Problem List and Plan: 1.  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  -patient may shower  -ELOS/Goals: 12-16 days/Supervision  Admit to CIR 2.  Antithrombotics: -DVT/anticoagulation:  Pharmaceutical: Lovenox  -antiplatelet therapy: DAPT X 3 weeks followed by ASA alone.  3. Lumbar stenosis/Pain Management: Denies back pain. N/A 4. Mood: LCSW to follow for evaluation and support.   -antipsychotic agents: N/A 5. Neuropsych: This patient is ?not fully capable of making decisions on his own behalf. 6. Skin/Wound Care: Routine pressure relief measures 7. Fluids/Electrolytes/Nutrition: Monitor I/Os  CMP ordered for Monday 8. Dyslipidemia   High dose statin. 9. HTN: Monitor BP TID  Norvasc  Monitor with increased mobility 10. Thrombocytopenia  Plts 141 on 4/6, labs ordered for Monday  Jacquelynn Cree, PA-C 04/06/2021  I have personally performed a face to face diagnostic evaluation, including, but not limited to relevant history and physical exam findings, of  this patient and developed relevant assessment and plan.  Additionally, I have reviewed and concur with the physician assistant's documentation above.  Delice Lesch, MD, ABPMR

## 2021-04-06 NOTE — TOC Transition Note (Signed)
Transition of Care [facility]) - CM/SW Discharge Note   Patient Details  Name: Maxwell Rollins MRN: [id number] Date of Birth: 07/30/1936  [facility] CM/SW Contact:  Kermit Balo, RN Phone Number: 04/06/2021, 11:27 AM   Clinical Narrative:    Patient discharging to CIR today. CM signing off.   Final next level of care: IP Rehab Facility Barriers to Discharge: No Barriers Identified   Patient Goals and CMS Choice   CMS Medicare.gov Compare Post Acute Care list provided to:: Patient Represenative (must comment) Choice offered to / list presented to : Spouse  Discharge Placement                       Discharge Plan and Services   Discharge Planning Services: CM Consult Post Acute Care Choice: Home Health                               Social Determinants of Health (SDOH) Interventions     Readmission Risk Interventions No flowsheet data found.

## 2021-04-06 NOTE — Progress Notes (Signed)
CSW received call from patient's daughter, Cristy Folks, to discuss concerns related to discharge planning. Lengthy conversation was had about difference between CIR and SNF, and then expectations upon completion of either rehab. Daughter concerned about if the patient was really appropriate for intense rehab at 59, and CSW reassured her that the medical team would not be pursuing it if they didn't think he could handle it or benefit from it. Daughter appreciative of information, and felt more confident in moving forward with CIR placement after discussion with CSW.   Daughter also expressed reasoning for completing HCPOA and placing her as the primary decision maker, because patient's wife was recently diagnosed with dementia and so they are planning ahead. Per daughter, the wife's deficits are not terrible at this time, but will progress over time and they want to make sure there are plans in place before that happens. Family will be at the hospital tomorrow to complete HCPOA paperwork, and CSW to notify chaplains when they are here so that it can be completed.   Blenda Nicely, [place] Clinical Social Worker [phone]

## 2021-04-06 NOTE — Progress Notes (Signed)
Telesitter has yet to arrive. Attempted to call telesitter, was told they are currently at max capacity and are unable to connect one. Patient is currently resting in bed with call light within reach.

## 2021-04-06 NOTE — Progress Notes (Signed)
Inpatient Rehabilitation Medication Review by a Pharmacist  A complete drug regimen review was completed for this patient to identify any potential clinically significant medication issues.  Clinically significant medication issues were identified:  yes   Type of Medication Issue Identified Description of Issue Urgent (address now) Non-Urgent (address on AM team rounds) Plan Plan Accepted by Provider? (Yes / No / Pending AM Rounds)                                Additional Drug Therapy Needed  Dc summary stated to continue b12, B6, and Vitamin D (Ergocalciferol) 1.25 MG (50000 UNIT) but on on transfer Non-urgent Secure chat PA   Other         Name of provider notified for urgent issues identified: Deatra Ina, PA  Provider Method of Notification: secure chat   For non-urgent medication issues to be resolved on team rounds tomorrow morning a [facility] Secure Chat Handoff was sent to:    Pharmacist comments:   Time spent performing this drug regimen review (minutes):  10

## 2021-04-06 NOTE — Progress Notes (Signed)
Marcello Fennel, MD  Physician  Physical Medicine and Rehabilitation  Consult Note      Signed  Date of Service:  04/04/2021  1:31 PM      Related encounter: ED to Hosp-Admission (Current) from 04/02/2021 in [place] [facility]       Signed      Expand All Collapse All     Show:Clear all Manual[x] Template[] Copied  Added by: Angiulli, Mcarthur Rossetti, PA-C[x] Allena Katz, Maryln Gottron, MD   Hover for details           Physical Medicine and Rehabilitation Consult Reason for Consult: Intermittent confusion with bilateral lower extremity weakness Referring Physician: Dr.Pahwani     HPI: Maxwell Rollins is a 85 y.o. right-handed male with history of mild dementia, hyperlipidemia and hypertension.  History taken from chart review and patient.  Patient lives with spouse.  Independent prior to admission and still driving.  Two-level home bed and bath main level.  He presented on 04/03/2022 with bilateral back pain with associated leg numbness of acute onset.  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  CT angio of the chest showed no evidence of thoracic aortic dissection or aneurysm.  MRA of the head unremarkable.  Patient did not receive TPA.  Admission chemistries unremarkable except creatinine 1.49, urine drug screen negative platelets 146,000.  Echocardiogram ejection fraction of 60-65%, grade 1 diastolic dysfunction.  Carotid Dopplers with no ICA stenosis.  Currently maintained on aspirin as well as Plavix for CVA prophylaxis x3 weeks and aspirin alone.  Venous Dopplers lower extremities negative for DVT.  Subcutaneous Lovenox for DVT prophylaxis.  Vascular surgery follow-up in regards to AAA and patient remains asymptomatic with plan follow-up [facility] to see Dr. Pattricia Boss.  Therapy evaluations completed due to  patient decreased functional mobility recommendations of physical medicine rehab consult.    Review of Systems  Constitutional:       Decrease in appetite  HENT: Negative for hearing loss.   Eyes: Negative for blurred vision and double vision.  Respiratory: Negative for cough and shortness of breath.   Cardiovascular: Negative for chest pain, palpitations and leg swelling.  Gastrointestinal: Positive for constipation. Negative for heartburn, nausea and vomiting.  Genitourinary: Negative for dysuria, flank pain and hematuria.  Musculoskeletal: Positive for back pain, joint pain and myalgias.  Skin: Negative for rash.  Neurological: Positive for weakness. Negative for sensory change.  Psychiatric/Behavioral:       Reports some decrease in memory over the last few months.  All other systems reviewed and are negative.       Past Medical History:  Diagnosis Date  . Dementia (HCC)    . Dyslipidemia    . Hypertension      No past surgical history No pertinent family history of premature CVA Social History:  reports that he has never smoked. He has never used smokeless tobacco. He reports that he does not drink alcohol and does not use drugs. Allergies:       Allergies  Allergen Reactions  . Contrast Media [Iodinated Diagnostic Agents]        Other reaction(s): rash/itching  . Red Dye            Medications Prior to Admission  Medication Sig Dispense Refill  . atorvastatin (LIPITOR) 20 MG tablet Take 20 mg by mouth at bedtime.      Marland Kitchen  meclizine (ANTIVERT) 25 MG tablet Take 12.5 mg by mouth 3 (three) times daily as needed for dizziness or nausea.      . Pyridoxine HCl (VITAMIN B-6 PO) Take 1 tablet by mouth daily.      . vitamin B-12 (CYANOCOBALAMIN) 1000 MCG tablet Take 1,000 mg by mouth daily.      . vitamin C (ASCORBIC ACID) 500 MG tablet Take 500 mg by mouth daily.      . Vitamin D, Ergocalciferol, (DRISDOL) 1.25 MG (50000 UNIT) CAPS capsule Take 500,000 Units by mouth every  Monday.          Home: Home Living Family/patient expects to be discharged to:: Private residence Living Arrangements: Spouse/significant other Available Help at Discharge: Family Type of Home: Other(Comment) (Condo) Home Access: Level entry Entrance Stairs-Number of Steps: 0 Home Layout: Two level,Able to live on main level with bedroom/bathroom,1/2 bath on main level Alternate Level Stairs-Number of Steps: 8 then 6 Bathroom Shower/Tub: Health visitorWalk-in shower Bathroom Toilet: Handicapped height Home Equipment: Crutches,Cane - single point,Shower seat - built in,Grab bars - tub/shower,Grab bars - toilet (access to walker, BSC if needed)  Functional History: Prior Function Level of Independence: Independent Comments: spouse completes IADLs, pt drives and completes med mgmt. No use of AD for mobility. Pt reports x1 fall in past 6 months in which he tripped over an object. Functional Status:  Mobility: Bed Mobility Overal bed mobility: Needs Assistance Bed Mobility: Supine to Sit Supine to sit: Supervision General bed mobility comments: for safety, HOB elevated Transfers Overall transfer level: Needs assistance Equipment used: Rolling walker (2 wheeled),None Transfers: Sit to/from Stand Sit to Stand: Min assist,Min guard,+2 safety/equipment General transfer comment: min assist +2 safety from EOB, fading to min guard +2 safety from recliner; initally assist to power up and steady Ambulation/Gait Ambulation/Gait assistance: Min assist,Min guard,+2 safety/equipment Gait Distance (Feet): 50 Feet Assistive device: Rolling walker (2 wheeled),None Gait Pattern/deviations: Step-through pattern,Decreased step length - right,Decreased step length - left,Decreased stride length,Trunk flexed General Gait Details: Pt ambulating with RW initially, needing cues to also advance RW after every 2 steps for safety as he would forget. MinA for steadying and safety initially. Progressed to min guard without UE  support, but displayed further decreased bil step length and increased bil knee and ankle instability. Gait velocity: reduced Gait velocity interpretation: <1.31 ft/sec, indicative of household ambulator   ADL: ADL Overall ADL's : Needs assistance/impaired Grooming: Min guard,Standing,Wash/dry hands,Wash/dry face Upper Body Bathing: Set up,Sitting Lower Body Bathing: Minimal assistance,Sit to/from stand Upper Body Dressing : Set up,Sitting Lower Body Dressing: Minimal assistance,Sit to/from stand Toilet Transfer: Minimal assistance,+2 for safety/equipment,Ambulation,RW Toilet Transfer Details (indicate cue type and reason): simulated in room Toileting- Clothing Manipulation and Hygiene: Maximal assistance,+2 for safety/equipment,Sit to/from stand Toileting - Clothing Manipulation Details (indicate cue type and reason): incontinent BM in bed, pt with no awareness and requires assist for hygiene Functional mobility during ADLs: Minimal assistance,+2 for safety/equipment,Rolling walker,Cueing for sequencing General ADL Comments: pt limited by balance, cognition, activity tolerance   Cognition: Cognition Overall Cognitive Status: History of cognitive impairments - at baseline Orientation Level: Oriented X4 Cognition Arousal/Alertness: Awake/alert Behavior During Therapy: WFL for tasks assessed/performed Overall Cognitive Status: History of cognitive impairments - at baseline Area of Impairment: Memory,Awareness,Problem solving,Following commands Memory: Decreased short-term memory Following Commands: Follows one step commands consistently,Follows one step commands with increased time,Follows multi-step commands inconsistently Awareness: Emergent Problem Solving: Slow processing,Requires verbal cues,Difficulty sequencing General Comments: pt with hx of dementia, oriented and pleasant.  follows simple commands and able to complete functional ADL tasks but difficulty sequencing multiple step  commands, recalling, and poor awareness (BM in bed with no awareness). Poor sequencing, needing repeated verbal cues to also push RW rather than try to keep walking without advancing it.   Blood pressure (!) 138/99, pulse 68, temperature 98.2 F (36.8 C), temperature source Oral, resp. rate 18, height  (1.88 m), weight 90.6 kg, SpO2 96 %. Physical Exam Vitals reviewed.  Constitutional:      General: He is not in acute distress.    Appearance: He is not ill-appearing.  HENT:     Head: Normocephalic and atraumatic.     Right Ear: External ear normal.     Left Ear: External ear normal.     Nose: Nose normal.  Eyes:     General:        Right eye: No discharge.        Left eye: No discharge.     Extraocular Movements: Extraocular movements intact.  Cardiovascular:     Rate and Rhythm: Normal rate and regular rhythm.  Pulmonary:     Effort: Pulmonary effort is normal. No respiratory distress.     Breath sounds: No stridor.  Abdominal:     General: Bowel sounds are normal.     Palpations: Abdomen is soft.  Musculoskeletal:     Cervical back: Normal range of motion and neck supple.     Comments: No edema or tenderness in extremities  Skin:    General: Skin is warm and dry.  Neurological:     Mental Status: He is alert.     Comments: Alert Makes eye contact with examiner.   Follows simple commands.   Oriented to person place with some initial hesitation in providing the appropriate year. Motor: Bilateral upper extremities: 5/5 proximal distal Bilateral lower extremities: 4+/5 hip flexion, knee extension, 5/5 ankle dorsiflexion Right upper extremity: Mild dysmetria  Psychiatric:        Mood and Affect: Affect is blunt.        Speech: Speech is delayed.        Lab Results Last 24 Hours       Results for orders placed or performed during the hospital encounter of 04/02/21 (from the past 24 hour(s))  CBC     Status: Abnormal    Collection Time: 04/04/21  8:39 AM  Result  Value Ref Range    WBC 7.9 4.0 - 10.5 K/uL    RBC 4.66 4.22 - 5.81 MIL/uL    Hemoglobin 13.8 13.0 - 17.0 g/dL    HCT 16.1 09.6 - 04.5 %    MCV 88.8 80.0 - 100.0 fL    MCH 29.6 26.0 - 34.0 pg    MCHC 33.3 30.0 - 36.0 g/dL    RDW 40.9 81.1 - 91.4 %    Platelets 141 (L) 150 - 400 K/uL    nRBC 0.0 0.0 - 0.2 %  Basic metabolic panel     Status: Abnormal    Collection Time: 04/04/21  8:39 AM  Result Value Ref Range    Sodium 141 135 - 145 mmol/L    Potassium 3.9 3.5 - 5.1 mmol/L    Chloride 106 98 - 111 mmol/L    CO2 28 22 - 32 mmol/L    Glucose, Bld 97 70 - 99 mg/dL    BUN 17 8 - 23 mg/dL    Creatinine, Ser 7.82 (H) 0.61 - 1.24 mg/dL  Calcium 8.7 (L) 8.9 - 10.3 mg/dL    GFR, Estimated 55 (L) >60 mL/min    Anion gap 7 5 - 15  Magnesium     Status: None    Collection Time: 04/04/21  8:39 AM  Result Value Ref Range    Magnesium 2.1 1.7 - 2.4 mg/dL       Imaging Results (Last 48 hours)  CT HEAD WO CONTRAST   Result Date: 04/02/2021 CLINICAL DATA:  Acute neurological deficit with bilateral leg weakness and numbness beginning this afternoon. EXAM: CT HEAD WITHOUT CONTRAST TECHNIQUE: Contiguous axial images were obtained from the base of the skull through the vertex without intravenous contrast. COMPARISON:  None. FINDINGS: Brain: Diffuse cerebral atrophy. Ventricular dilatation consistent with central atrophy. Low-attenuation changes in the deep white matter consistent with small vessel ischemia. No abnormal extra-axial fluid collections. No mass effect or midline shift. Gray-white matter junctions are distinct. Basal cisterns are not effaced. No acute intracranial hemorrhage. Vascular: No hyperdense vessel or unexpected calcification. Skull: Normal. Negative for fracture or focal lesion. Sinuses/Orbits: Postoperative changes in the maxillary antra and ethmoid air cells. Mild mucosal thickening in the paranasal sinuses. No acute air-fluid levels. Mastoid air cells are clear. Other: None.  IMPRESSION: 1. No acute intracranial abnormalities. 2. Chronic atrophy and small vessel ischemia. Electronically Signed   By: Burman Nieves M.D.   On: 04/02/2021 19:15    MR ANGIO HEAD WO CONTRAST   Result Date: 04/03/2021 CLINICAL DATA:  Vertigo.  Numbness of the feet.  Dementia. EXAM: MRA HEAD WITHOUT CONTRAST TECHNIQUE: Angiographic images of the Circle of Willis were obtained using MRA technique without intravenous contrast. COMPARISON:  MRI yesterday. FINDINGS: Both internal carotid arteries are widely patent into the brain. No siphon stenosis. The anterior and middle cerebral vessels are patent without proximal stenosis, aneurysm or vascular malformation. Both vertebral arteries are widely patent to the basilar. No basilar stenosis. Posterior circulation branch vessels appear normal. IMPRESSION: Normal intracranial MR angiography of the large and medium size vessels. Electronically Signed   By: Paulina Fusi M.D.   On: 04/03/2021 09:17    MR BRAIN WO CONTRAST   Result Date: 04/03/2021 CLINICAL DATA:  Initial evaluation for lower extremity numbness. EXAM: MRI HEAD WITHOUT CONTRAST TECHNIQUE: Multiplanar, multiecho pulse sequences of the brain and surrounding structures were obtained without intravenous contrast. COMPARISON:  Prior head CT from earlier the same day. FINDINGS: Brain: Moderately advanced age-related cerebral atrophy. Patchy T2/FLAIR hyperintensity within the periventricular and deep white matter both cerebral hemispheres most consistent with chronic small vessel ischemic disease, mild for age. Single punctate 5 mm focus of diffusion abnormality seen involving the mesial left temporal lobe, consistent with a small acute ischemic infarct (series 2, image 21). No associated hemorrhage or mass effect. No other diffusion abnormality to suggest acute or subacute ischemia. Gray-white matter differentiation otherwise maintained. No encephalomalacia to suggest chronic cortical infarction elsewhere  within the brain. No other evidence for acute or chronic intracranial hemorrhage. No mass lesion, midline shift or mass effect. Mild ventricular prominence related to global parenchymal volume loss without hydrocephalus. No extra-axial fluid collection. Pituitary gland suprasellar region normal. Midline structures intact. Vascular: Major intracranial vascular flow voids are maintained. Skull and upper cervical spine: Craniocervical junction within normal limits. Bone marrow signal intensity normal. No scalp soft tissue abnormality. Sinuses/Orbits: Patient status post bilateral ocular lens replacement. Globes and orbital soft tissues demonstrate no acute finding. Chronic frontoethmoidal sinusitis noted. Mastoid air cells are clear. Inner ear structures grossly  normal. Other: None. IMPRESSION: 1. 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 2. No other acute intracranial abnormality. 3. Moderately advanced age-related cerebral atrophy with mild chronic small vessel ischemic disease. 4. Chronic frontoethmoidal sinusitis. Electronically Signed   By: Rise Mu M.D.   On: 04/03/2021 00:18    MR LUMBAR SPINE WO CONTRAST   Result Date: 04/03/2021 CLINICAL DATA:  Initial evaluation for low back pain, cauda equina syndrome suspected. EXAM: MRI LUMBAR SPINE WITHOUT CONTRAST TECHNIQUE: Multiplanar, multisequence MR imaging of the lumbar spine was performed. No intravenous contrast was administered. COMPARISON:  None available. FINDINGS: Segmentation: Standard. Lowest well-formed disc space labeled the L5-S1 level. Alignment: 4 mm anterolisthesis of L5 on S1, with trace 2 mm retrolisthesis of L4 on L5. Findings chronic and facet mediated. Alignment otherwise normal with preservation of the normal lumbar lordosis. Vertebrae: Vertebral body height maintained without acute or chronic fracture. Bone marrow signal intensity diffusely heterogeneous without discrete  or worrisome osseous lesion. No abnormal marrow edema. Conus medullaris and cauda equina: Conus extends to the T12-L1 level. Conus and cauda equina appear normal. Paraspinal and other soft tissues: Paraspinous soft tissues demonstrate no acute finding. Multiple scattered benign appearing cyst noted within the partially visualized kidneys, largest of which measures 4.1 cm on the left. A large irregular multilobulated aneurysm involving the intra-abdominal aorta partially visualized, measuring up to at least 5.3 cm in AP diameter (series 11, image 12). Disc levels: T12-L1: Disc desiccation without significant disc bulge. Anterior endplate osteophytic spurring. No spinal stenosis. Foramina remain patent. L1-2: Disc desiccation with mild disc bulge. Anterior endplate osteophytic spurring. Mild facet and ligament flavum hypertrophy. No significant spinal stenosis. Foramina remain patent. L2-3: Diffuse disc bulge with disc desiccation. Reactive endplate spurring. Mild facet and ligament flavum hypertrophy. No significant spinal stenosis. Foramina remain patent. L3-4: Advanced degenerative intervertebral disc space narrowing with diffuse disc bulge and disc desiccation. Associated reactive endplate spurring. Mild facet and ligament flavum hypertrophy. No significant spinal stenosis. Mild bilateral L3 foraminal narrowing. No frank impingement. L4-5: Trace retrolisthesis. Degenerative intervertebral disc space narrowing with diffuse disc bulge and disc desiccation. Superimposed reactive endplate change with marginal endplate osteophytic spurring. Superimposed small central disc protrusion indents the ventral thecal sac (series 14, image 32). Moderate facet and ligament flavum hypertrophy. Resultant mild canal with right worse than left lateral recess stenosis. Moderate right worse than left L4 foraminal narrowing. L5-S1: 4 mm anterolisthesis. Disc desiccation with mild annular disc bulge. Severe right worse than left facet  arthrosis with associated small joint effusions. Secondary mild narrowing of the lateral recesses. Central canal remains patent. Moderate right with mild left L5 foraminal narrowing. IMPRESSION: 1. No acute abnormality within the lumbar spine. No evidence for cord compression. 2. Multifactorial degenerative changes at L4-5 with resultant mild spinal stenosis, with moderate right worse than left L4 foraminal narrowing. 3. 4 mm anterolisthesis of L5 on S1 with associated severe right worse than left facet arthrosis, with resultant moderate right L5 foraminal narrowing. 4. Additional more mild degenerative disc bulging and facet hypertrophy at L1-2 thru L3-4 without significant stenosis or neural impingement. 5. Large irregular multilobulated aneurysm involving the intra-abdominal aorta, partially visualized. Further assessment with dedicated CTA of the abdomen and pelvis recommended for further evaluation. Electronically Signed   By: Rise Mu M.D.   On: 04/03/2021 00:28    ECHOCARDIOGRAM COMPLETE   Result Date: 04/03/2021    ECHOCARDIOGRAM REPORT   Patient Name:   CHADWICK REISWIG  Date of Exam: 04/03/2021 Medical Rec #:  [id number]            Height:       74.0 in Accession #:    [id number]           Weight:       203.0 lb Date of Birth:  1936-12-18            BSA:          2.187 m Patient Age:    84 years             BP:           164/104 mmHg Patient Gender: M                    HR:           79 bpm. Exam Location:  Inpatient Procedure: 2D Echo, Cardiac Doppler and Color Doppler Indications:    CVA  History:        Patient has no prior history of Echocardiogram examinations.                 Signs/Symptoms:Dementia; Risk Factors:Hypertension,                 Dyslipidemia, Former Smoker and Diabetes. CKD.  Sonographer:    Lavenia Atlas Referring Phys: [id number] SRISHTI L BHAGAT IMPRESSIONS  1. Left ventricular ejection fraction, by estimation, is 60 to 65%. The left ventricle has normal function. The  left ventricle has no regional wall motion abnormalities. Left ventricular diastolic parameters are consistent with Grade I diastolic dysfunction (impaired relaxation).  2. Right ventricular systolic function is normal. The right ventricular size is normal. There is normal pulmonary artery systolic pressure.  3. The mitral valve is normal in structure. No evidence of mitral valve regurgitation. No evidence of mitral stenosis.  4. The aortic valve is normal in structure. Aortic valve regurgitation is not visualized. Mild aortic valve sclerosis is present, with no evidence of aortic valve stenosis.  5. The inferior vena cava is normal in size with greater than 50% respiratory variability, suggesting right atrial pressure of 3 mmHg. FINDINGS  Left Ventricle: Left ventricular ejection fraction, by estimation, is 60 to 65%. The left ventricle has normal function. The left ventricle has no regional wall motion abnormalities. The left ventricular internal cavity size was normal in size. There is  no left ventricular hypertrophy. Left ventricular diastolic parameters are consistent with Grade I diastolic dysfunction (impaired relaxation). Right Ventricle: The right ventricular size is normal. No increase in right ventricular wall thickness. Right ventricular systolic function is normal. There is normal pulmonary artery systolic pressure. The tricuspid regurgitant velocity is 2.38 m/s, and  with an assumed right atrial pressure of 3 mmHg, the estimated right ventricular systolic pressure is 25.7 mmHg. Left Atrium: Left atrial size was normal in size. Right Atrium: Right atrial size was normal in size. Pericardium: There is no evidence of pericardial effusion. Mitral Valve: The mitral valve is normal in structure. No evidence of mitral valve regurgitation. No evidence of mitral valve stenosis. Tricuspid Valve: The tricuspid valve is normal in structure. Tricuspid valve regurgitation is not demonstrated. No evidence of  tricuspid stenosis. Aortic Valve: The aortic valve is normal in structure. Aortic valve regurgitation is not visualized. Mild aortic valve sclerosis is present, with no evidence of aortic valve stenosis. Pulmonic Valve: The pulmonic valve was normal in structure. Pulmonic valve regurgitation is not visualized. No evidence of pulmonic  stenosis. Aorta: The aortic root is normal in size and structure. Venous: The inferior vena cava is normal in size with greater than 50% respiratory variability, suggesting right atrial pressure of 3 mmHg. IAS/Shunts: No atrial level shunt detected by color flow Doppler.  LEFT VENTRICLE PLAX 2D LVIDd:         5.00 cm  Diastology LVIDs:         3.40 cm  LV e' medial:    10.40 cm/s LV PW:         1.10 cm  LV E/e' medial:  7.0 LV IVS:        1.10 cm  LV e' lateral:   6.42 cm/s LVOT diam:     2.40 cm  LV E/e' lateral: 11.4 LV SV:         71 LV SV Index:   32 LVOT Area:     4.52 cm  RIGHT VENTRICLE RV Basal diam:  2.30 cm RV S prime:     20.00 cm/s TAPSE (M-mode): 3.0 cm LEFT ATRIUM             Index       RIGHT ATRIUM           Index LA diam:        3.80 cm 1.74 cm/m  RA Area:     16.50 cm LA Vol (A2C):   50.9 ml 23.27 ml/m RA Volume:   39.80 ml  18.20 ml/m LA Vol (A4C):   29.1 ml 13.30 ml/m LA Biplane Vol: 41.6 ml 19.02 ml/m  AORTIC VALVE LVOT Vmax:   79.00 cm/s LVOT Vmean:  45.400 cm/s LVOT VTI:    0.156 m  AORTA Ao Root diam: 3.40 cm MITRAL VALVE               TRICUSPID VALVE MV Area (PHT): 7.99 cm    TR Peak grad:   22.7 mmHg MV Decel Time: 95 msec     TR Vmax:        238.00 cm/s MV E velocity: 73.30 cm/s MV A velocity: 87.80 cm/s  SHUNTS MV E/A ratio:  0.83        Systemic VTI:  0.16 m                            Systemic Diam: 2.40 cm Rachelle Hora Croitoru MD Electronically signed by Thurmon Fair MD Signature Date/Time: 04/03/2021/2:34:57 PM    Final     CT Angio Chest/Abd/Pel for Dissection W and/or Wo Contrast   Result Date: 04/03/2021 CLINICAL DATA:  85 year old male with chest  and back pain, and lower extremity weakness. Suspect aortic aneurysm and dissection EXAM: CT ANGIOGRAPHY CHEST, ABDOMEN AND PELVIS TECHNIQUE: Non-contrast CT of the chest was initially obtained. Multidetector CT imaging through the chest, abdomen and pelvis was performed using the standard protocol during bolus administration of intravenous contrast. Multiplanar reconstructed images and MIPs were obtained and reviewed to evaluate the vascular anatomy. CONTRAST:  OMNIPAQUE IOHEXOL 350 MG/ML SOLN COMPARISON:  None. FINDINGS: CTA CHEST FINDINGS Cardiovascular: No evidence of acute intramural hematoma on the initial noncontrast enhanced images. The aortic root is normal in caliber. There is significant motion artifact, but no definitive abnormality. Mildly ectatic but nonaneurysmal ascending thoracic aorta with a maximal diameter of 3.9 cm. No evidence of thoracic aortic dissection. Conventional 2 vessel arch anatomy. Mild elongation of the aortic isthmus and proximal descending thoracic aorta. The heart is at the  upper limits of normal for size. Calcifications noted along the coronary arteries. No pericardial effusion. Normal caliber main and central pulmonary arteries. No central PE. Mediastinum/Nodes: Unremarkable CT appearance of the thyroid gland. No suspicious mediastinal or hilar adenopathy. No soft tissue mediastinal mass. The thoracic esophagus is unremarkable. Lungs/Pleura: Extensive respiratory motion artifact significantly limits evaluation of the lungs for pulmonary nodules. Extensive peripheral subpleural reticulation and architectural distortion throughout the lungs most significant in the lower lobes. Assessment for honeycomb in is limited by the respiratory motion artifact. Overall, findings are consistent with pulmonary fibrosis. No pneumothorax or pleural effusion. Musculoskeletal: No acute fracture or aggressive appearing lytic or blastic osseous lesion. Review of the MIP images confirms the  above findings. CTA ABDOMEN AND PELVIS FINDINGS VASCULAR Aorta: Extremely tortuous abdominal aorta with a tri lobed juxtarenal abdominal aortic aneurysm. At the level of the renal arteries, the aorta makes an approximately 79 degree angle toward the right for several cm before making a second 79 degree angle inferiorly toward the bifurcation. This results in a zigzag configuration of the aneurysmal abdominal aorta. The largest aneurysmal segment is the most proximal juxtarenal portion of the aneurysm which measures up to 6.2 cm in diameter (see coronal reformatted image 69 of series 9). The more distal infrarenal segment measures up to 4.5 cm in diameter. The aneurysm extends from the origin of the right renal artery to the aortic bifurcation. No associated inflammatory changes or retroperitoneal stranding to suggest impending rupture. Celiac: The common hepatic and splenic arteries have separate origins from the abdominal aorta. A beaded appearance at the origin of the splenic artery may represent underlying fibromuscular dysplasia. No evidence of dissection, significant stenosis or significant aneurysm. SMA: Patent without evidence of aneurysm, dissection, vasculitis or significant stenosis. The SMA arises at the level of the initial aneurysmal portion of the abdominal aorta. Renals: The right renal artery arises at the level of the SMA and at the beginning of the aneurysmal segment of the abdominal aorta. The artery is patent without evidence of dissection or fibromuscular dysplasia. The left renal artery arises from the anterior aspect of the most aneurysmal segment of the aorta and appears diffusely diseased. There is a small accessory artery to the lower pole of the left kidney arising from the distal aneurysmal segment of the aorta. IMA: Patent.  Suspect at least mild stenosis at the origin. Inflow: No evidence of dissection or aneurysm involving the iliac arteries. Scattered atherosclerotic plaque. Both  internal iliac arteries remain widely patent. The external iliac arteries are relatively spared from disease. The visualized common femoral, superficial femoral and profunda femoral arteries are also relatively spared from disease. Veins: No focal venous abnormality. Review of the MIP images confirms the above findings. NON-VASCULAR Hepatobiliary: No focal liver abnormality is seen. No gallstones, gallbladder wall thickening, or biliary dilatation. Pancreas: Unremarkable. No pancreatic ductal dilatation or surrounding inflammatory changes. Spleen: Normal in size without focal abnormality. Adrenals/Urinary Tract: Unremarkable adrenal glands. No evidence of hydronephrosis or nephrolithiasis. Mild atrophy of the left kidney likely secondary to chronic arterial insufficiency. 4 cm simple cyst exophytic from the posterior lower pole of the left kidney. 5.4 cm simple cyst exophytic from the upper pole of the right kidney. Unremarkable ureters and bladder. Stomach/Bowel: No evidence of obstruction or focal bowel wall thickening. Normal appendix in the right lower quadrant. The terminal ileum is unremarkable. Lymphatic: No suspicious lymphadenopathy. Reproductive: Mild prostatomegaly. Other: No abdominal wall hernia or abnormality. No abdominopelvic ascites. Musculoskeletal: No acute fracture or aggressive  appearing lytic or blastic osseous lesion. Multilevel degenerative disc disease. Review of the MIP images confirms the above findings. IMPRESSION: CTA CHEST 1. No evidence of thoracic aortic dissection or aneurysm. 2. The thoracic aorta is mildly ectatic but likely within normal limits for age at 3.9 cm. 3. Coronary artery calcifications. 4. Very limited evaluation of the lungs due to extensive respiratory motion artifact. However, CT findings suggest underlying pulmonary fibrosis, perhaps related to interstitial lung disease such as usual interstitial pneumonitis. CTA ABD/PELVIS 1. 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 The aneurysmal segment of the aorta is highly tortuous with a zigzag shaped including two nearly 80 degree angulations. No evidence of rupture/impending rupture at this time. Recommend referral to a vascular specialist. This recommendation follows ACR consensus guidelines: White Paper of the ACR Incidental Findings Committee II on Vascular Findings. J Am Coll Radiol 2013; 10:789-794. 2. 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 3. Mild left renal atrophy likely related to chronic ischemia from renal artery stenosis. 4. Bilateral renal cysts. 5. Prostatomegaly. Signed, Sterling Big, MD, RPVI [facility] Electronically Signed   By: Malachy Moan M.D.   On: 04/03/2021 06:19    VAS US CAROTID   Result Date: 04/04/2021 Carotid Arterial Duplex Study Indications:       CVA. Risk Factors:      Hypertension, hyperlipidemia, no history of smoking. Comparison Study:  No previous exams Performing Technologist: Ernestene Mention  Examination Guidelines: A complete evaluation includes B-mode imaging, spectral Doppler, color Doppler, and power Doppler as needed of all accessible portions of each vessel. Bilateral testing is considered an integral part of a complete examination. Limited examinations for reoccurring indications may be performed as noted.  Right Carotid Findings: +----------+--------+--------+--------+------------------+------------------+           PSV cm/sEDV cm/sStenosisPlaque DescriptionComments           +----------+--------+--------+--------+------------------+------------------+ CCA Prox  55      14                                intimal thickening +----------+--------+--------+--------+------------------+------------------+ CCA Distal67      17                                 intimal thickening +----------+--------+--------+--------+------------------+------------------+ ICA Prox  51      15                                                   +----------+--------+--------+--------+------------------+------------------+ ICA Distal55      16                                                   +----------+--------+--------+--------+------------------+------------------+ ECA       69      14                                                   +----------+--------+--------+--------+------------------+------------------+ +----------+--------+-------+----------------+-------------------+  PSV cm/sEDV cmsDescribe        Arm Pressure (mmHG) +----------+--------+-------+----------------+-------------------+ [id number]             Multiphasic, WNL                    +----------+--------+-------+----------------+-------------------+ +---------+--------+--+--------+-+---------+ VertebralPSV cm/s37EDV cm/s8Antegrade +---------+--------+--+--------+-+---------+  Left Carotid Findings: +----------+--------+--------+--------+------------------+------------------+           PSV cm/sEDV cm/sStenosisPlaque DescriptionComments           +----------+--------+--------+--------+------------------+------------------+ CCA Prox  77      18                                intimal thickening +----------+--------+--------+--------+------------------+------------------+ CCA Distal79      20                                intimal thickening +----------+--------+--------+--------+------------------+------------------+ ICA Prox  46      16                                                   +----------+--------+--------+--------+------------------+------------------+ ICA Distal62      20                                                   +----------+--------+--------+--------+------------------+------------------+ ECA       51      10                                                    +----------+--------+--------+--------+------------------+------------------+ +----------+--------+--------+----------------+-------------------+           PSV cm/sEDV cm/sDescribe        Arm Pressure (mmHG) +----------+--------+--------+----------------+-------------------+ [id number]              Multiphasic, WNL                    +----------+--------+--------+----------------+-------------------+ +---------+--------+--+--------+--+---------+ VertebralPSV cm/s39EDV cm/s11Antegrade +---------+--------+--+--------+--+---------+   Summary: Right Carotid: The extracranial vessels were near-normal with only minimal wall                thickening or plaque. Left Carotid: The extracranial vessels were near-normal with only minimal wall               thickening or plaque. Vertebrals:  Bilateral vertebral arteries demonstrate antegrade flow. Subclavians: Normal flow hemodynamics were seen in bilateral subclavian              arteries. *See table(s) above for measurements and observations.  Electronically signed by Delia Heady MD on 04/04/2021 at 8:23:52 AM.    Final     VAS [place] LOWER EXTREMITY VENOUS (DVT)   Result Date: 04/04/2021  Lower Venous DVT Study Indications: Stroke.  Comparison Study: No previous exams Performing Technologist: Ernestene Mention  Examination Guidelines: A complete evaluation includes B-mode imaging, spectral Doppler, color Doppler, and power Doppler as needed of all accessible portions of each vessel. Bilateral testing is considered an integral part  of a complete examination. Limited examinations for reoccurring indications may be performed as noted. The reflux portion of the exam is performed with the patient in reverse Trendelenburg.  +---------+---------------+---------+-----------+----------+--------------+ RIGHT    CompressibilityPhasicitySpontaneityPropertiesThrombus Aging  +---------+---------------+---------+-----------+----------+--------------+ CFV      Full           Yes      Yes                                 +---------+---------------+---------+-----------+----------+--------------+ SFJ      Full                                                        +---------+---------------+---------+-----------+----------+--------------+ FV Prox  Full           Yes      Yes                                 +---------+---------------+---------+-----------+----------+--------------+ FV Mid   Full           Yes      Yes                                 +---------+---------------+---------+-----------+----------+--------------+ FV DistalFull           Yes      Yes                                 +---------+---------------+---------+-----------+----------+--------------+ PFV      Full                                                        +---------+---------------+---------+-----------+----------+--------------+ POP      Full           Yes      Yes                                 +---------+---------------+---------+-----------+----------+--------------+ PTV      Full                                                        +---------+---------------+---------+-----------+----------+--------------+ PERO     Full                                                        +---------+---------------+---------+-----------+----------+--------------+   +---------+---------------+---------+-----------+----------+--------------+ LEFT     CompressibilityPhasicitySpontaneityPropertiesThrombus Aging +---------+---------------+---------+-----------+----------+--------------+ CFV      Full           Yes      Yes                                 +---------+---------------+---------+-----------+----------+--------------+  SFJ      Full                                                         +---------+---------------+---------+-----------+----------+--------------+ FV Prox  Full           Yes      Yes                                 +---------+---------------+---------+-----------+----------+--------------+ FV Mid   Full           Yes      Yes                                 +---------+---------------+---------+-----------+----------+--------------+ FV DistalFull           Yes      Yes                                 +---------+---------------+---------+-----------+----------+--------------+ PFV      Full                                                        +---------+---------------+---------+-----------+----------+--------------+ POP      Full           Yes      Yes                                 +---------+---------------+---------+-----------+----------+--------------+ PTV      Full                                                        +---------+---------------+---------+-----------+----------+--------------+ PERO     Full                                                        +---------+---------------+---------+-----------+----------+--------------+     Summary: BILATERAL: - No evidence of deep vein thrombosis seen in the lower extremities, bilaterally. - No evidence of superficial venous thrombosis in the lower extremities, bilaterally. -No evidence of popliteal cyst, bilaterally.   *See table(s) above for measurements and observations. Electronically signed by Gretta Began MD on 04/04/2021 at 6:00:22 AM.    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  Records reviewed  and  summated above.   1. Does the need for close, 24 hr/day medical supervision in concert with the patient's rehab needs make it unreasonable for this patient to be served in a less intensive setting? Yes  2. Co-Morbidities requiring supervision/potential complications:  mild dementia, hyperlipidemia (Lipitor), HTN (monitor and provide prns in accordance with increased physical exertion and pain), prediabetes (Monitor in accordance with exercise and adjust meds as necessary), Thrombocytopenia (< 60,000/mm3 no resistive exercise) 3. Due to safety, disease management, medication administration and patient education, does the patient require 24 hr/day rehab nursing? Yes 4. Does the patient require coordinated care of a physician, rehab nurse, therapy disciplines of PT/OT to address physical and functional deficits in the context of the above medical diagnosis(es)? Yes Addressing deficits in the following areas: balance, endurance, locomotion, strength, transferring, bathing, dressing, toileting and psychosocial support 5. Can the patient actively participate in an intensive therapy program of at least 3 hrs of therapy per day at least 5 days per week? Yes 6. The potential for patient to make measurable gains while on inpatient rehab is excellent 7. Anticipated functional outcomes upon discharge from inpatient rehab are modified independent and supervision  with PT, modified independent and supervision with OT, n/a with SLP. 8. Estimated rehab length of stay to reach the above functional goals is: 7-10 days. 9. Anticipated discharge destination: Home 10. Overall Rehab/Functional Prognosis: good   RECOMMENDATIONS: This patient's condition is appropriate for continued rehabilitative care in the following setting: CIR patient did not progress to supervision level of functioning Patient has agreed to participate in recommended program. Yes Note that insurance prior authorization may be required for  reimbursement for recommended care.   Comment: Rehab Admissions Coordinator to follow up.   I have personally performed a face to face diagnostic evaluation, including, but not limited to relevant history and physical exam findings, of this patient and developed relevant assessment and plan.  Additionally, I have reviewed and concur with the physician assistant's documentation above.    Maryla Morrow, MD, ABPMR Charlton Amor, PA-C 04/04/2021          Revision History                        Routing History              Note Details  Author Allena Katz, Maryln Gottron, MD File Time 04/04/2021  2:23 PM  Author Type Physician Status Signed  Last Editor Marcello Fennel, MD Service Physical Medicine and Rehabilitation

## 2021-04-06 NOTE — Progress Notes (Signed)
Marcello FennelPatel, Ankit Anil, MD  Physician  Physical Medicine and Rehabilitation  PMR Pre-admission      Addendum  Date of Service:  04/06/2021 12:55 PM      Related encounter: ED to Hosp-Admission (Current) [facility]           Show:Clear all [x] Manual[x] Template[x] Copied  Added by: [x] Standley BrookingBoyette, Meigan Pates G, RN[x] Marcello FennelPatel, Ankit Anil, MD   [] Hover for details  PMR Admission Coordinator Pre-Admission Assessment   Patient: Maxwell Rollins is an 85 y.o., male MRN: [id number] DOB: Aug 23, 1936 Height: 6\' 2"  (188 cm) Weight: 90.6 kg                                                                                                                                                  Insurance Information HMO:     PPO:      PCP:      IPA:      80/20:      OTHER:  PRIMARY: Medicare a and b      Policy#: [id number]      Subscriber: pt Benefits:  Phone #: passport one online     Name: 4/8 Eff. Date: a 05/31/2003 and b 03/30/2006     Deduct: $1556      Out of Pocket Max: none      Life Max: none  CIR: 100%      SNF: 20 full days Outpatient: 80%     Co-Pay: 20% Home Health: 100%      Co-Pay: none DME: 80%     Co-Pay: 20% Providers: pt choice  SECONDARY: [employer]      Policy#: [id number]   Financial Counselor:       Phone#:    The "Data Collection Information Summary" for patients in Inpatient Rehabilitation Facilities with attached "Privacy Act Statement-Health Care Records" was provided and verbally reviewed with: Patient and Family   Emergency Contact Information Contact Information       Name Relation Home Work Mobile    Bulger,[name] Daughter     [phone]    Effie Shyodobihnski, [name] Spouse     [phone]    Heloise PurpuraHall, Jackie Friend     [phone]    Ma RingsDoolin, Chris Son     [phone]         Current Medical History  Patient Admitting Diagnosis: CVA   History of Present Illness: 85 y.o. right-handed male with history of mild dementia,  hyperlipidemia and hypertension.   He presented on 04/03/2022 with bilateral back pain with associated leg numbness of acute onset.  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  CT angio of the chest showed no evidence of thoracic aortic dissection or  aneurysm.  MRA of the head unremarkable.  Patient did not receive TPA.  Admission chemistries unremarkable except creatinine 1.49, urine drug screen negative platelets 146,000.  Echocardiogram ejection fraction of 60-65%, grade 1 diastolic dysfunction.  Carotid Dopplers with no ICA stenosis.  Currently maintained on aspirin as well as Plavix for CVA prophylaxis x3 weeks and aspirin alone.  Venous Dopplers lower extremities negative for DVT.  Subcutaneous Lovenox for DVT prophylaxis.  Vascular surgery follow-up in regards to AAA and patient remains asymptomatic with plan follow-up [facility] Bane to see Dr. Pattricia Boss.     Complete NIHSS TOTAL: 1 Glasgow Coma Scale Score: 15   Past Medical History      Past Medical History:  Diagnosis Date  . Dementia (HCC)    . Dyslipidemia    . Hypertension        Family History  family history is not on file.   Prior Rehab/Hospitalizations:  Has the patient had prior rehab or hospitalizations prior to admission? Yes   Has the patient had major surgery during 100 days prior to admission? Yes   Current Medications    Current Facility-Administered Medications:  .  0.9 %  sodium chloride infusion, , Intravenous, Continuous, Jonah Blue, MD, Last Rate: 50 mL/hr at 04/04/21 1703, New Bag/Given (Non-Interop) at 04/04/21 1703 .  amLODipine (NORVASC) tablet 5 mg, 5 mg, Oral, Daily, Pahwani, Ravi, MD, 5 mg at 04/06/21 1119 .  aspirin EC tablet 81 mg, 81 mg, Oral, Daily, 81 mg at 04/06/21 1119 **OR** [DISCONTINUED] aspirin suppository 300 mg, 300 mg, Rectal,  Daily, Jonah Blue, MD .  atorvastatin (LIPITOR) tablet 40 mg, 40 mg, Oral, QHS, Jonah Blue, MD, 40 mg at 04/05/21 2135 .  clopidogrel (PLAVIX) tablet 75 mg, 75 mg, Oral, Daily, Marvel Plan, MD, 75 mg at 04/06/21 1119 .  enoxaparin (LOVENOX) injection 40 mg, 40 mg, Subcutaneous, Q24H, Jonah Blue, MD, 40 mg at 04/06/21 1123 .  feeding supplement (ENSURE ENLIVE / ENSURE PLUS) liquid 237 mL, 237 mL, Oral, BID BM, Pokhrel, Laxman, MD, 237 mL at 04/06/21 1000 .  haloperidol lactate (HALDOL) injection 2 mg, 2 mg, Intravenous, Q6H PRN, Jonah Blue, MD, 2 mg at 04/05/21 0935 .  hydrALAZINE (APRESOLINE) tablet 25 mg, 25 mg, Oral, Q6H PRN, Hughie Closs, MD .  meclizine (ANTIVERT) tablet 12.5 mg, 12.5 mg, Oral, TID PRN, Jonah Blue, MD .  multivitamin with minerals tablet 1 tablet, 1 tablet, Oral, Daily, Pokhrel, Laxman, MD, 1 tablet at 04/06/21 1118 .  ondansetron (ZOFRAN) injection 4 mg, 4 mg, Intravenous, Q6H PRN, Jonah Blue, MD   Patients Current Diet:  Diet Order                  Diet Heart Room service appropriate? Yes; Fluid consistency: Thin  Diet effective now                         Precautions / Restrictions Precautions Precautions: Fall Precaution Comments: repetitive cues for hand placement Restrictions Weight Bearing Restrictions: No    Has the patient had 2 or more falls or a fall with injury in the past year?No   Prior Activity Level Community (5-7x/wk): Indpendent and driving   Prior Functional Level Prior Function Level of Independence: Independent Comments: patient independent with all adls   Self Care: Did the patient need help bathing, dressing, using the toilet or eating?  Independent   Indoor Mobility: Did the patient need assistance with walking from room to  room (with or without device)? Independent   Stairs: Did the patient need assistance with internal or external stairs (with or without device)? Independent   Functional  Cognition: Did the patient need help planning regular tasks such as shopping or remembering to take medications? Independent   Home Assistive Devices / Equipment Home Assistive Devices/Equipment: None Home Equipment: Crutches,Cane - single point,Shower seat - built in,Grab bars - tub/shower,Grab bars - toilet (access to walker, BSC if needed)   Prior Device Use: Indicate devices/aids used by the patient prior to current illness, exacerbation or injury? None of the above   Current Functional Level Cognition   Arousal/Alertness: Awake/alert Overall Cognitive Status: Impaired/Different from baseline Current Attention Level: Selective Orientation Level: Oriented X4 Following Commands: Follows one step commands inconsistently,Follows one step commands with increased time Safety/Judgement: Decreased awareness of deficits,Decreased awareness of safety General Comments: generally showing signs of dementia with repetitive instructions and tactile cues needed Attention: Selective,Sustained Sustained Attention: Appears intact Sustained Attention Impairment: Verbal basic Selective Attention: Impaired Selective Attention Impairment: Verbal basic Memory: Impaired Memory Impairment: Retrieval deficit,Decreased recall of new information,Decreased short term memory Decreased Short Term Memory: Verbal basic Awareness: Impaired Awareness Impairment: Emergent impairment Problem Solving: Impaired Problem Solving Impairment: Verbal basic Safety/Judgment: Impaired    Extremity Assessment (includes Sensation/Coordination)   Upper Extremity Assessment: Defer to OT evaluation  Lower Extremity Assessment: RLE deficits/detail (Bil knee and ankle instability noted with standing mobility) RLE Deficits / Details: Minor weakness in R hip flexor (4+/5) compared to L (5/5) RLE Sensation:  (denies numbness/tingling)     ADLs   Overall ADL's : Needs assistance/impaired Grooming: Min guard,Standing,Wash/dry  hands,Wash/dry face Upper Body Bathing: Set up,Sitting Lower Body Bathing: Minimal assistance,Sit to/from stand Upper Body Dressing : Set up,Sitting Lower Body Dressing: Minimal assistance,Sit to/from stand Toilet Transfer: Minimal assistance,+2 for safety/equipment,Ambulation,RW Toilet Transfer Details (indicate cue type and reason): simulated in room Toileting- Clothing Manipulation and Hygiene: Maximal assistance,+2 for safety/equipment,Sit to/from stand Toileting - Clothing Manipulation Details (indicate cue type and reason): incontinent BM in bed, pt with no awareness and requires assist for hygiene Functional mobility during ADLs: Minimal assistance,+2 for safety/equipment,Rolling walker,Cueing for sequencing General ADL Comments: pt limited by balance, cognition, activity tolerance     Mobility   Overal bed mobility: Needs Assistance Bed Mobility: Supine to Sit Supine to sit: Supervision General bed mobility comments: up  in chair     Transfers   Overall transfer level: Needs assistance Equipment used: Rolling walker (2 wheeled),1 person hand held assist Transfers: Sit to/from Stand Sit to Stand: Mod assist,Max assist General transfer comment: greater assist needed today, possibly from fatigue     Ambulation / Gait / Stairs / Wheelchair Mobility   Ambulation/Gait Ambulation/Gait assistance: Min assist,Min guard,+2 safety/equipment Gait Distance (Feet): 50 Feet Assistive device: Rolling walker (2 wheeled),None Gait Pattern/deviations: Step-through pattern,Decreased step length - right,Decreased step length - left,Decreased stride length,Trunk flexed General Gait Details: pt reports he cannot take a single step despite reattempts Gait velocity: reduced Gait velocity interpretation: <1.31 ft/sec, indicative of household ambulator     Posture / Balance Balance Overall balance assessment: Needs assistance Sitting-balance support: No upper extremity supported,Feet  supported Sitting balance-Leahy Scale: Fair Standing balance support: Bilateral upper extremity supported,No upper extremity supported,During functional activity Standing balance-Leahy Scale: Poor Standing balance comment: preference to BUE support but able to maintain balance dynamically with up to min assist without UE support     Special needs/care consideration      Previous Home Environment  Living Arrangements: Spouse/significant other  Lives With: Spouse Available Help at Discharge: Family,Available 24 hours/day Type of Home:  (townhome) Home Layout: Two level,Able to live on main level with bedroom/bathroom,1/2 bath on main level Alternate Level Stairs-Number of Steps: 8 then 6 Home Access: Level entry Entrance Stairs-Number of Steps: 0 Bathroom Shower/Tub: Health visitor: Handicapped height Bathroom Accessibility: Yes How Accessible: Accessible via walker Home Care Services: No   Discharge Living Setting Plans for Discharge Living Setting: Patient's home,Lives with (comment) (wife in townhome) Type of Home at Discharge: Other (Comment) (townhome) Discharge Home Layout: Two level,1/2 bath on main level Alternate Level Stairs-Rails: Right,Left,Can reach both Alternate Level Stairs-Number of Steps: 8 then 6 Discharge Home Access: Level entry Discharge Bathroom Shower/Tub: Walk-in shower Discharge Bathroom Toilet: Handicapped height Discharge Bathroom Accessibility: Yes How Accessible: Accessible via walker Does the patient have any problems obtaining your medications?: No   Social/Family/Support Systems Contact Information: daughter, Prince Rome "Nanta" Anticipated Caregiver: friend Annice Pih and wife, Elnita Maxwell Anticipated Industrial/product designer Information: see above, Daughter is first contact who discused with her stepmom and Annice Pih Ability/Limitations of Caregiver: Wife has some undiagnosis memory issues; patient has some word finding issues, but without dx of  dementia Caregiver Availability: 24/7 (wife can provide superivison level) Discharge Plan Discussed with Primary Caregiver: Yes Is Caregiver In Agreement with Plan?: Yes Does Caregiver/Family have Issues with Lodging/Transportation while Pt is in Rehab?: No   Goals Patient/Family Goal for Rehab: Supervision with PT, OT and SLP Expected length of stay: [facility] 13-16 days Cultural Considerations: lives in [place], but [place] and has alot to say about current conflict there Pt/Family Agrees to Admission and willing to participate: Yes Program Orientation Provided & Reviewed with Pt/Caregiver Including Roles  & Responsibilities: Yes   Decrease burden of Care through IP rehab admission: n/a   Possible need for SNF placement upon discharge: not anticipated   Patient Condition: This patient's condition remains as documented in the consult dated 04/04/2021, in which the Rehabilitation Physician determined and documented that the patient's condition is appropriate for intensive rehabilitative care in an inpatient rehabilitation facility. Will admit to inpatient rehab today.   Preadmission Screen Completed By:  Clois Dupes, RN, 04/06/2021 12:56 PM ______________________________________________________________________   Discussed status with Dr. Allena Katz on 04/06/2021 at  1304 and received approval for admission today.   Admission Coordinator:  Clois Dupes, time 5053 Date 04/06/2021           Revision History                             Note Details  Author Allena Katz, Maryln Gottron, MD File Time 04/06/2021  1:05 PM  Author Type Physician Status Addendum  Last Editor Marcello Fennel, MD Service Physical Medicine and Rehabilitation

## 2021-04-06 NOTE — Discharge Instructions (Signed)
Abdominal Aortic Aneurysm  An aneurysm is a bulge in a blood vessel that carries blood away from the heart (artery). It happens when blood pushes against a weak or damaged place on the wall of the blood vessel. An abdominal aortic aneurysm happens in the main blood vessel that carries blood away from the heart (aorta). Most aneurysms do not cause problems, but some do cause problems. If an aneurysm grows, it can burst or tear. This causes bleeding inside you. It is an emergency. It can be life-threatening. What are the causes? The exact cause of this condition is not known. What increases the risk? The following factors may make you more likely to develop this condition:  Being male and 60 years of age or older.  Being of [place] descent.  Using nicotine or tobacco now or in the past.  Having a family history of aneurysms.  Having any of these problems: ? Hardening of blood vessels that carry blood away from the heart. ? Irritation and swelling of the walls of blood vessels that carry blood away from the heart. ? Certain genetic problems. ? Being very overweight. ? An infection in the wall of your aorta. ? High cholesterol. ? High blood pressure (hypertension). What are the signs or symptoms? Symptoms depend on the size of your aneurysm and how fast it is growing. Most aneurysms grow slowly and do not cause symptoms. If symptoms happen, you may:  Have very bad pain in your belly (abdomen), side, or low back.  Feel full after eating only a little food.  Feel a throbbing lump in your belly.  Have these problems with your feet or toes: ? Pain. ? Skin turning blue. ? Sores.  Have trouble pooping (constipation).  Have trouble peeing (urinating). If your aneurysm bursts, you may:  Feel sudden, very bad pain in the belly, side, or back.  Feel like you may vomit.  Vomit.  Feel light-headed.  Faint. How is this treated? Treatment for this condition depends  on:  The size of your aneurysm.  How fast it is growing.  Your age.  Your risk of having the aneurysm burst. If your aneurysm is smaller than 2 inches (5 cm), your doctor may:  Check it often to see if it is growing. You may have an imaging test (ultrasound) to check it every 3-6 months, every year, or every few years.  Give you medicines for: ? High blood pressure. ? Pain. ? Infection. If your aneurysm is larger than 2 inches (5 cm), you may need surgery to fix it. Follow these instructions at home: Eating and drinking  Eat a heart-healthy diet. Eat a lot of: ? Fresh fruits and vegetables. ? Whole grains. ? Low-fat (lean) protein. ? Low-fat dairy products.  Avoid foods that are high in saturated fat and cholesterol. These foods include red meat and some dairy products.   Lifestyle  Do not use any products that contain nicotine or tobacco, such as cigarettes, e-cigarettes, and chewing tobacco. If you need help quitting, ask your doctor.  Stay active and get exercise. Ask your doctor how often to exercise and what types of exercise are safe for you.  Keep a healthy weight.      Alcohol use  Do not drink alcohol if: ? Your doctor tells you not to drink. ? You are pregnant, may be pregnant, or are planning to become pregnant.  If you drink alcohol: ? Limit how much you use to:  0-1 drink a day   for women.  0-2 drinks a day for men. ? Be aware of how much alcohol is in your drink. In the [place] one drink equals one 12 oz bottle of beer (355 mL), one 5 oz glass of wine (148 mL), or one 1 oz glass of hard liquor (44 mL). General instructions  Take over-the-counter and prescription medicines only as told by your doctor.  Keep your blood pressure in a normal range. Check it at regular times. Ask your doctor what level it should be.  Have regular checks of your levels of blood sugar (glucose) and cholesterol. Follow steps to keep these levels near normal.  Avoid heavy  lifting and activities that take a lot of effort. Ask what activities are safe for you.  If you can, learn your family's health history.  Keep all follow-up visits as told by your doctor. This is important. Contact a doctor if:  Your belly, side, or back hurts.  Your belly throbs.  You have a fever. Get help right away if:  You have sudden, bad pain in your belly, side, or back.  You feel like you may vomit or you vomit.  You feel light-headed or you faint.  Your heart beats fast when you stand.  You have sweaty skin that is cold to the touch (clammy).  You are short of breath.  You have trouble pooping.  You have trouble peeing. These symptoms may be an emergency. Do not wait to see if the symptoms will go away. Get medical help right away. Call your local emergency services (911 in the [place] Do not drive yourself to the hospital. Summary  An aneurysm is a bulge in one of the blood vessels that carry blood away from the heart (artery). An abdominal aortic aneurysm happens in the main blood vessel that carries blood away from the heart (aorta).  This condition can cause bleeding inside the body. It can be life-threatening.  Risk can rise if you are male, age 60 or older, and of [place] descent. Risk can also rise from nicotine or tobacco use or having aneurysms in the family.  Get help right away if you have symptoms of a burst aneurysm. This information is not intended to replace advice given to you by your health care provider. Make sure you discuss any questions you have with your health care provider. Document Revised: 10/01/2019 Document Reviewed: 10/01/2019 Elsevier Patient Education  2021 Elsevier Inc.  

## 2021-04-06 NOTE — Progress Notes (Signed)
Patient ID: Maxwell Rollins, male   DOB: 08-31-1936, 85 y.o.   MRN: [id number] Met with the patient to review role of the nurse CM and address educational needs. Patient w dementia and recent stroke and cognitive issues. Handouts and educational book left in room for wife and daughter to review. Continue to follow along to discharge to address questions/concerns and collaborate with the SW to facilitate preparation and transition to discharge. Margarito Liner

## 2021-04-06 NOTE — Care Management Important Message (Signed)
Important Message  Patient Details  Name: Maxwell Rollins MRN: [id number] Date of Birth: May 19, 1936   Medicare Important Message Given:  Yes     Akiko Schexnider Stefan Church 04/06/2021, 2:33 PM

## 2021-04-06 NOTE — Progress Notes (Signed)
Inpatient Rehabilitation  Patient information reviewed and entered into eRehab system by Kenika Sahm M. Edith Groleau, M.A., CCC/SLP, PPS Coordinator.  Information including medical coding, functional ability and quality indicators will be reviewed and updated through discharge.    

## 2021-04-06 NOTE — H&P (Signed)
Physical Medicine and Rehabilitation Admission H&P    Chief Complaint  Patient presents with  . Functional deficits    HPI: Maxwell Rollins is an 85 year old male with history of dyslipidemia, Covid 19 2021 with resultant dizziness/balance problems, 3 week history of weakness who was admitted on 04/02/2021 with B/l LE numbness with reports of  back pain and inability to walk. He was noted to have some confusion with ?history of dementia. UDS negative. MRI/MRA brain done revealing punctate infarct in mesial left temporal lobe and moderately advanced age related cerebral atrophy and mild chronic small vessel disease. 2D echocardiogram with EF of 60-65%, no wall abnormalities. Stroke felt to be due to small vessel disease. Dr Roda Shutters recommended DAPT X 3 weeks followed by ASA alone and that symptoms due to lumbar stenosis.   MRI lumbar spine multifactorial degenerative changes in L4-5 with mild spinal stenosis and moderate R>L L4 foraminal stenosis and L5 on S1 anterolisthesis with severe right >left facet arthrosis and moderate right L5 foraminal narrowing and large multi lobulated aneurysm.  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 Dr. Arbie Cookey consulted for input and will follow along. Patient will need consideration for elected repair and would need eventual referral to [facility] (Dr. Pattricia Boss) for off label graft Tx or potential open repair. He continues to have problems with mobility and therapy is working on pregait activity. Please see preadmission assessment from earlier today as well. .   Review of Systems  Constitutional: Negative for chills and fever.  HENT: Negative for hearing loss.   Eyes: Negative for blurred vision and double vision.  Respiratory: Negative for cough and shortness of breath.   Cardiovascular: Negative for chest pain and  palpitations.  Gastrointestinal: Negative for abdominal pain and heartburn.  Genitourinary: Negative for dysuria and urgency.  Musculoskeletal: Negative for joint pain, myalgias and neck pain.  Skin: Negative for itching.  Neurological: Negative for dizziness.  All other systems reviewed and are negative.   Past Medical History:  Diagnosis Date  . Dementia (HCC)   . Dyslipidemia   . Hypertension     Past Surgical History:  Procedure Laterality Date  . NASAL POLYP EXCISION     Surgeries X 5 for removal     Family History  Problem Relation Age of Onset  . Heart disease Mother      Social History:  Married. 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  Goes to the gym twice a week--does bike/treadmill/weights X 1 hour. Marland Kitchen He reports that he quit smoking 250 years ago---smoked "too many years".  He has never used smokeless tobacco. He reports that he drinks beer or mixed drink "here and there" He dose not use drugs.    Allergies  Allergen Reactions  . Contrast Media [Iodinated Diagnostic Agents]     Other reaction(s): rash/itching  . Red Dye     Medications Prior to Admission  Medication Sig Dispense Refill  . atorvastatin (LIPITOR) 20 MG tablet Take 20 mg by mouth at bedtime.    . meclizine (ANTIVERT) 25 MG tablet Take 12.5 mg by mouth 3 (three) times daily as needed for dizziness or nausea.    . Pyridoxine HCl (VITAMIN B-6 PO) Take 1 tablet by mouth daily.    . vitamin B-12 (CYANOCOBALAMIN) 1000 MCG tablet Take 1,000 mg by mouth daily.    Marland Kitchen  vitamin C (ASCORBIC ACID) 500 MG tablet Take 500 mg by mouth daily.    . Vitamin D, Ergocalciferol, (DRISDOL) 1.25 MG (50000 UNIT) CAPS capsule Take 500,000 Units by mouth every Monday.      Drug Regimen Review  Drug regimen was reviewed and remains appropriate with no significant issues identified  Home: Home Living Family/patient expects to be discharged to:: Private residence Living  Arrangements: Spouse/significant other Available Help at Discharge: Family,Available 24 hours/day Type of Home:  (townhome) Home Access: Level entry Entrance Stairs-Number of Steps: 0 Home Layout: Two level,Able to live on main level with bedroom/bathroom,1/2 bath on main level Alternate Level Stairs-Number of Steps: 8 then 6 Bathroom Shower/Tub: Health visitor: Handicapped height Bathroom Accessibility: Yes Home Equipment: Crutches,Cane - single point,Shower seat - built in,Grab bars - tub/shower,Grab bars - toilet (access to walker, BSC if needed)  Lives With: Spouse   Functional History: Prior Function Level of Independence: Independent Comments: patient independent with all adls  Functional Status:  Mobility: Bed Mobility Overal bed mobility: Needs Assistance Bed Mobility: Supine to Sit Supine to sit: Supervision General bed mobility comments: up  in chair Transfers Overall transfer level: Needs assistance Equipment used: Rolling walker (2 wheeled),1 person hand held assist Transfers: Sit to/from Stand Sit to Stand: Mod assist,Max assist General transfer comment: greater assist needed today, possibly from fatigue Ambulation/Gait Ambulation/Gait assistance: Min assist,Min guard,+2 safety/equipment Gait Distance (Feet): 50 Feet Assistive device: Rolling walker (2 wheeled),None Gait Pattern/deviations: Step-through pattern,Decreased step length - right,Decreased step length - left,Decreased stride length,Trunk flexed General Gait Details: pt reports he cannot take a single step despite reattempts Gait velocity: reduced Gait velocity interpretation: <1.31 ft/sec, indicative of household ambulator    ADL: ADL Overall ADL's : Needs assistance/impaired Grooming: Min guard,Standing,Wash/dry hands,Wash/dry face Upper Body Bathing: Set up,Sitting Lower Body Bathing: Minimal assistance,Sit to/from stand Upper Body Dressing : Set up,Sitting Lower Body Dressing:  Minimal assistance,Sit to/from stand Toilet Transfer: Minimal assistance,+2 for safety/equipment,Ambulation,RW Toilet Transfer Details (indicate cue type and reason): simulated in room Toileting- Clothing Manipulation and Hygiene: Maximal assistance,+2 for safety/equipment,Sit to/from stand Toileting - Clothing Manipulation Details (indicate cue type and reason): incontinent BM in bed, pt with no awareness and requires assist for hygiene Functional mobility during ADLs: Minimal assistance,+2 for safety/equipment,Rolling walker,Cueing for sequencing General ADL Comments: pt limited by balance, cognition, activity tolerance  Cognition: Cognition Overall Cognitive Status: Impaired/Different from baseline Arousal/Alertness: Awake/alert Orientation Level: Oriented X4 Attention: Selective,Sustained Sustained Attention: Appears intact Sustained Attention Impairment: Verbal basic Selective Attention: Impaired Selective Attention Impairment: Verbal basic Memory: Impaired Memory Impairment: Retrieval deficit,Decreased recall of new information,Decreased short term memory Decreased Short Term Memory: Verbal basic Awareness: Impaired Awareness Impairment: Emergent impairment Problem Solving: Impaired Problem Solving Impairment: Verbal basic Safety/Judgment: Impaired Cognition Arousal/Alertness: Awake/alert Behavior During Therapy: Flat affect Overall Cognitive Status: Impaired/Different from baseline Area of Impairment: Problem solving,Awareness,Safety/judgement,Following commands,Memory,Attention,Orientation Orientation Level: Situation Current Attention Level: Selective Memory: Decreased recall of precautions,Decreased short-term memory Following Commands: Follows one step commands inconsistently,Follows one step commands with increased time Safety/Judgement: Decreased awareness of deficits,Decreased awareness of safety Awareness: Anticipatory,Intellectual Problem Solving: Slow  processing,Requires verbal cues,Requires tactile cues General Comments: generally showing signs of dementia with repetitive instructions and tactile cues needed  Physical Exam: Blood pressure (!) 139/103, pulse 82, temperature 98.2 F (36.8 C), temperature source Oral, resp. rate 18, height 6\' 2"  (1.88 m), weight 90.6 kg, SpO2 96 %. Physical Exam Vitals reviewed.  Constitutional:      General: He is not  in acute distress.    Appearance: He is not ill-appearing.  HENT:     Head: Normocephalic and atraumatic.     Right Ear: External ear normal.     Left Ear: External ear normal.     Nose: Nose normal.  Eyes:     General:        Right eye: No discharge.        Left eye: No discharge.     Extraocular Movements: Extraocular movements intact.  Cardiovascular:     Rate and Rhythm: Normal rate and regular rhythm.  Pulmonary:     Effort: Pulmonary effort is normal. No respiratory distress.     Breath sounds: Normal breath sounds. No stridor.  Abdominal:     General: Abdomen is flat. Bowel sounds are normal. There is no distension.  Musculoskeletal:     Cervical back: Normal range of motion and neck supple.     Comments: No edema or tenderness in extremities  Skin:    General: Skin is warm and dry.  Neurological:     Mental Status: He is alert.     Comments: Alert HOH Motor: Bilateral upper extremities: 5/5 proximal distal Bilateral lower extremities: 4+/5 hip flexion, knee extension, 5/5 ankle dorsiflexion, improving Right upper extremity: mild dysmetria  Psychiatric:        Mood and Affect: Mood normal.        Behavior: Behavior normal.     Results for orders placed or performed during the hospital encounter of 04/02/21 (from the past 48 hour(s))  Basic metabolic panel     Status: Abnormal   Collection Time: 04/05/21  4:00 AM  Result Value Ref Range   Sodium 137 135 - 145 mmol/L   Potassium 3.8 3.5 - 5.1 mmol/L   Chloride 105 98 - 111 mmol/L   CO2 29 22 - 32 mmol/L    Glucose, Bld 113 (H) 70 - 99 mg/dL    Comment: Glucose reference range applies only to samples taken after fasting for at least 8 hours.   BUN 18 8 - 23 mg/dL   Creatinine, Ser 2.70 (H) 0.61 - 1.24 mg/dL   Calcium 8.4 (L) 8.9 - 10.3 mg/dL   GFR, Estimated 52 (L) >60 mL/min    Comment: (NOTE) Calculated using the CKD-EPI Creatinine Equation (2021)    Anion gap 3 (L) 5 - 15    Comment: Performed at [facility], [address] [place], [place] [address]   No results found.     Medical Problem List and Plan: 1.  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  -patient may shower  -ELOS/Goals: 12-16 days/Supervision  Admit to CIR 2.  Antithrombotics: -DVT/anticoagulation:  Pharmaceutical: Lovenox  -antiplatelet therapy: DAPT X 3 weeks followed by ASA alone.  3. Lumbar stenosis/Pain Management: Denies back pain. N/A 4. Mood: LCSW to follow for evaluation and support.   -antipsychotic agents: N/A 5. Neuropsych: This patient is ?not fully capable of making decisions on his own behalf. 6. Skin/Wound Care: Routine pressure relief measures 7. Fluids/Electrolytes/Nutrition: Monitor I/Os  CMP ordered for Monday 8. Dyslipidemia   High dose statin. 9. HTN: Monitor BP TID  Norvasc  Monitor with increased mobility 10. Thrombocytopenia  Plts 141 on 4/6, labs ordered for Monday  Jacquelynn Cree, PA-C 04/06/2021  I have personally performed a face to face diagnostic evaluation, including, but not limited to relevant history and physical exam findings, of  this patient and developed relevant assessment and plan.  Additionally, I have reviewed and concur with the physician assistant's documentation above.  Maryla Morrow, MD, ABPMR  The patient's status has not changed. Any changes from the pre-admission screening or documentation from the acute chart are noted above.   Maryla Morrow, MD, ABPMR

## 2021-04-06 NOTE — PMR Pre-admission (Addendum)
PMR Admission Coordinator Pre-Admission Assessment  Patient: Maxwell Rollins is an 85 y.o., male MRN: [id number] DOB: 1936/09/20 Height: 6\' 2"  (188 cm) Weight: 90.6 kg              Insurance Information HMO:     PPO:      PCP:      IPA:      80/20:      OTHER:  PRIMARY: Medicare a and b      Policy#: [id number]      Subscriber: pt Benefits:  Phone #: passport one online     Name: 4/8 Eff. Date: a 05/31/2003 and b 03/30/2006     Deduct: $1556      Out of Pocket Max: none      Life Max: none  CIR: 100%      SNF: 20 full days Outpatient: 80%     Co-Pay: 20% Home Health: 100%      Co-Pay: none DME: 80%     Co-Pay: 20% Providers: pt choice  SECONDARY: [employer]      Policy#: 05/30/2006  Financial Counselor:       Phone#:   The "Data Collection Information Summary" for patients in Inpatient Rehabilitation Facilities with attached "Privacy Act Statement-Health Care Records" was provided and verbally reviewed with: Patient and Family  Emergency Contact Information Contact Information     Name Relation Home Work Mobile   Kondracki,[name] Daughter   [phone]   [phone] Spouse   Effie Shy   [phone]   Heloise Purpura   [phone]   [phone]      Current Medical History  Patient Admitting Diagnosis: CVA  History of Present Illness: 85 y.o. right-handed male with history of mild dementia, hyperlipidemia and hypertension.   He presented on 04/03/2022 with bilateral back pain with associated leg numbness of acute onset.  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  CT angio of the chest showed no evidence of thoracic aortic dissection or aneurysm.  MRA of the head unremarkable.  Patient did not receive TPA.  Admission chemistries unremarkable except creatinine 1.49, urine drug screen  negative platelets 146,000.  Echocardiogram ejection fraction of 60-65%, grade 1 diastolic dysfunction.  Carotid Dopplers with no ICA stenosis.  Currently maintained on aspirin as well as Plavix for CVA prophylaxis x3 weeks and aspirin alone.  Venous Dopplers lower extremities negative for DVT.  Subcutaneous Lovenox for DVT prophylaxis.  Vascular surgery follow-up in regards to AAA and patient remains asymptomatic with plan follow-up 06/03/2022 to see Dr. Kendell Bane.    Complete NIHSS TOTAL: 1 Glasgow Coma Scale Score: 15  Past Medical History  Past Medical History:  Diagnosis Date   Dementia (HCC)    Dyslipidemia    Hypertension     Family History  family history is not on file.  Prior Rehab/Hospitalizations:  Has the patient had prior rehab or hospitalizations prior to admission? Yes  Has the patient had major surgery during 100 days prior to admission? Yes  Current Medications   Current Facility-Administered Medications:    0.9 %  sodium chloride infusion, , Intravenous, Continuous, Pattricia Boss, MD, Last Rate: 50 mL/hr at 04/04/21 1703, New Bag/Given (Non-Interop) at 04/04/21 1703   amLODipine (NORVASC) tablet 5 mg, 5 mg, Oral, Daily, Pahwani, Ravi, MD, 5 mg at 04/06/21 1119  aspirin EC tablet 81 mg, 81 mg, Oral, Daily, 81 mg at 04/06/21 1119 **OR** [DISCONTINUED] aspirin suppository 300 mg, 300 mg, Rectal, Daily, Jonah Blue, MD   atorvastatin (LIPITOR) tablet 40 mg, 40 mg, Oral, QHS, Jonah Blue, MD, 40 mg at 04/05/21 2135   clopidogrel (PLAVIX) tablet 75 mg, 75 mg, Oral, Daily, Marvel Plan, MD, 75 mg at 04/06/21 1119   enoxaparin (LOVENOX) injection 40 mg, 40 mg, Subcutaneous, Q24H, Jonah Blue, MD, 40 mg at 04/06/21 1123   feeding supplement (ENSURE ENLIVE / ENSURE PLUS) liquid 237 mL, 237 mL, Oral, BID BM, Pokhrel, Laxman, MD, 237 mL at 04/06/21 1000   haloperidol lactate (HALDOL) injection 2 mg, 2 mg, Intravenous, Q6H PRN, Jonah Blue, MD, 2 mg at 04/05/21  0935   hydrALAZINE (APRESOLINE) tablet 25 mg, 25 mg, Oral, Q6H PRN, Hughie Closs, MD   meclizine (ANTIVERT) tablet 12.5 mg, 12.5 mg, Oral, TID PRN, Jonah Blue, MD   multivitamin with minerals tablet 1 tablet, 1 tablet, Oral, Daily, Pokhrel, Laxman, MD, 1 tablet at 04/06/21 1118   ondansetron (ZOFRAN) injection 4 mg, 4 mg, Intravenous, Q6H PRN, Jonah Blue, MD  Patients Current Diet:  Diet Order             Diet Heart Room service appropriate? Yes; Fluid consistency: Thin  Diet effective now                   Precautions / Restrictions Precautions Precautions: Fall Precaution Comments: repetitive cues for hand placement Restrictions Weight Bearing Restrictions: No   Has the patient had 2 or more falls or a fall with injury in the past year?No  Prior Activity Level Community (5-7x/wk): Indpendent and driving  Prior Functional Level Prior Function Level of Independence: Independent Comments: patient independent with all adls  Self Care: Did the patient need help bathing, dressing, using the toilet or eating?  Independent  Indoor Mobility: Did the patient need assistance with walking from room to room (with or without device)? Independent  Stairs: Did the patient need assistance with internal or external stairs (with or without device)? Independent  Functional Cognition: Did the patient need help planning regular tasks such as shopping or remembering to take medications? Independent  Home Assistive Devices / Equipment Home Assistive Devices/Equipment: None Home Equipment: Crutches,Cane - single point,Shower seat - built in,Grab bars - tub/shower,Grab bars - toilet (access to walker, BSC if needed)  Prior Device Use: Indicate devices/aids used by the patient prior to current illness, exacerbation or injury? None of the above  Current Functional Level Cognition  Arousal/Alertness: Awake/alert Overall Cognitive Status: Impaired/Different from baseline Current  Attention Level: Selective Orientation Level: Oriented X4 Following Commands: Follows one step commands inconsistently,Follows one step commands with increased time Safety/Judgement: Decreased awareness of deficits,Decreased awareness of safety General Comments: generally showing signs of dementia with repetitive instructions and tactile cues needed Attention: Selective,Sustained Sustained Attention: Appears intact Sustained Attention Impairment: Verbal basic Selective Attention: Impaired Selective Attention Impairment: Verbal basic Memory: Impaired Memory Impairment: Retrieval deficit,Decreased recall of new information,Decreased short term memory Decreased Short Term Memory: Verbal basic Awareness: Impaired Awareness Impairment: Emergent impairment Problem Solving: Impaired Problem Solving Impairment: Verbal basic Safety/Judgment: Impaired    Extremity Assessment (includes Sensation/Coordination)  Upper Extremity Assessment: Defer to OT evaluation  Lower Extremity Assessment: RLE deficits/detail (Bil knee and ankle instability noted with standing mobility) RLE Deficits / Details: Minor weakness in R hip flexor (4+/5) compared to L (5/5) RLE Sensation:  (denies numbness/tingling)    ADLs  Overall ADL's : Needs assistance/impaired Grooming: Min guard,Standing,Wash/dry hands,Wash/dry face Upper Body Bathing: Set up,Sitting Lower Body Bathing: Minimal assistance,Sit to/from stand Upper Body Dressing : Set up,Sitting Lower Body Dressing: Minimal assistance,Sit to/from stand Toilet Transfer: Minimal assistance,+2 for safety/equipment,Ambulation,RW Toilet Transfer Details (indicate cue type and reason): simulated in room Toileting- Clothing Manipulation and Hygiene: Maximal assistance,+2 for safety/equipment,Sit to/from stand Toileting - Clothing Manipulation Details (indicate cue type and reason): incontinent BM in bed, pt with no awareness and requires assist for hygiene Functional  mobility during ADLs: Minimal assistance,+2 for safety/equipment,Rolling walker,Cueing for sequencing General ADL Comments: pt limited by balance, cognition, activity tolerance    Mobility  Overal bed mobility: Needs Assistance Bed Mobility: Supine to Sit Supine to sit: Supervision General bed mobility comments: up  in chair    Transfers  Overall transfer level: Needs assistance Equipment used: Rolling walker (2 wheeled),1 person hand held assist Transfers: Sit to/from Stand Sit to Stand: Mod assist,Max assist General transfer comment: greater assist needed today, possibly from fatigue    Ambulation / Gait / Stairs / Wheelchair Mobility  Ambulation/Gait Ambulation/Gait assistance: Min assist,Min guard,+2 safety/equipment Gait Distance (Feet): 50 Feet Assistive device: Rolling walker (2 wheeled),None Gait Pattern/deviations: Step-through pattern,Decreased step length - right,Decreased step length - left,Decreased stride length,Trunk flexed General Gait Details: pt reports he cannot take a single step despite reattempts Gait velocity: reduced Gait velocity interpretation: <1.31 ft/sec, indicative of household ambulator    Posture / Balance Balance Overall balance assessment: Needs assistance Sitting-balance support: No upper extremity supported,Feet supported Sitting balance-Leahy Scale: Fair Standing balance support: Bilateral upper extremity supported,No upper extremity supported,During functional activity Standing balance-Leahy Scale: Poor Standing balance comment: preference to BUE support but able to maintain balance dynamically with up to min assist without UE support    Special needs/care consideration    Previous Home Environment  Living Arrangements: Spouse/significant other  Lives With: Spouse Available Help at Discharge: Family,Available 24 hours/day Type of Home:  (townhome) Home Layout: Two level,Able to live on main level with bedroom/bathroom,1/2 bath on main  level Alternate Level Stairs-Number of Steps: 8 then 6 Home Access: Level entry Entrance Stairs-Number of Steps: 0 Bathroom Shower/Tub: Health visitorWalk-in shower Bathroom Toilet: Handicapped height Bathroom Accessibility: Yes How Accessible: Accessible via walker Home Care Services: No  Discharge Living Setting Plans for Discharge Living Setting: Patient's home,Lives with (comment) (wife in townhome) Type of Home at Discharge: Other (Comment) (townhome) Discharge Home Layout: Two level,1/2 bath on main level Alternate Level Stairs-Rails: Right,Left,Can reach both Alternate Level Stairs-Number of Steps: 8 then 6 Discharge Home Access: Level entry Discharge Bathroom Shower/Tub: Walk-in shower Discharge Bathroom Toilet: Handicapped height Discharge Bathroom Accessibility: Yes How Accessible: Accessible via walker Does the patient have any problems obtaining your medications?: No  Social/Family/Support Systems Contact Information: daughter, Prince RomeWylanta "Nanta" Anticipated Caregiver: friend Annice PihJackie and wife, Elnita MaxwellCheryl Anticipated Industrial/product designerCaregiver's Contact Information: see above, Daughter is first contact who discused with her stepmom and Annice PihJackie Ability/Limitations of Caregiver: Wife has some undiagnosis memory issues; patient has some word finding issues, but without dx of dementia Caregiver Availability: 24/7 (wife can provide superivison level) Discharge Plan Discussed with Primary Caregiver: Yes Is Caregiver In Agreement with Plan?: Yes Does Caregiver/Family have Issues with Lodging/Transportation while Pt is in Rehab?: No  Goals Patient/Family Goal for Rehab: Supervision with PT, OT and SLP Expected length of stay: ELOS 13-16 days Cultural Considerations[place], [place] and has alot to say about current conflict there Pt/Family Agrees to Admission and willing to  participate: Yes Program Orientation Provided & Reviewed with Pt/Caregiver Including Roles  & Responsibilities:  Yes  Decrease burden of Care through IP rehab admission: n/a  Possible need for SNF placement upon discharge: not anticipated  Patient Condition: This patient's condition remains as documented in the consult dated 04/04/2021, in which the Rehabilitation Physician determined and documented that the patient's condition is appropriate for intensive rehabilitative care in an inpatient rehabilitation facility. Will admit to inpatient rehab today.  Preadmission Screen Completed By:  Clois Dupes, RN, 04/06/2021 12:56 PM ______________________________________________________________________   Discussed status with Dr. Allena Katz on 04/06/2021 at  1304 and received approval for admission today.  Admission Coordinator:  Clois Dupes, time 9741 Date 04/06/2021

## 2021-04-06 NOTE — Progress Notes (Signed)
Inpatient Rehabilitation Admissions Coordinator  I met wt bedside with patient , his wife and friend, [name], with his daughter, Marval Regal on conference call by phone. We discussed goals and expectations of a CIR admit. All prefer CIR rather than SNF. I contacted Dr Doristine Bosworth, acute team and TOC. I will make the arrangements to admit patient today.  Danne Baxter, RN, MSN Rehab Admissions Coordinator [phone] 04/06/2021 12:29 PM

## 2021-04-06 NOTE — Discharge Summary (Signed)
Physician Discharge Summary  Maxwell Rollins WUJ:[id number] DOB: [birth year] DOA: 04/02/2021  PCP: Elise Benne, MD  Admit date: 04/02/2021 Discharge date: 04/06/2021 30 Day Unplanned Readmission Risk Score   Flowsheet Row ED to Hosp-Admission (Current) from 04/02/2021 in [facility]  30 Day Unplanned Readmission Risk Score (%) 13.5 Filed at 04/06/2021 1200     This score is the patient's risk of an unplanned readmission within 30 days of being discharged (0 -100%). The score is based on dignosis, age, lab data, medications, orders, and past utilization.   Low:  0-14.9   Medium: 15-21.9   High: 22-29.9   Extreme: 30 and above         Admitted From: Home Disposition: CIR  Recommendations for Outpatient Follow-up:  1. Follow up with PCP in 1-2 weeks 2. Follow with neurology in 2 to 4 weeks 3. Follow-up with vascular surgery within 2 weeks 4. Please take aspirin along with Plavix for next 18 days and then stop Plavix but continue taking aspirin. 5. Please abstain from driving until cleared by neurology after outpatient neurocognitive evaluation of dementia. 6. Please obtain BMP/CBC in one week 7. Please follow up with your PCP on the following pending results: Unresulted Labs (From admission, onward)         None        Home Health: None Equipment/Devices: None  Discharge Condition: Stable CODE STATUS: Code Diet recommendation: Cardiac  Subjective: Seen and examined.  No complaints.  Brief/Interim Summary: Maxwell Rollins a 85 y.o.malewith medical history significant ofHLD; HTN; and dementia presented with B leg numbness and back pain.He finished eating lunch and tried to stand up and was unable.No dysphagia or dysarthria.  Upon arrival to ED, he was hemodynamically stable.  Patient underwent CT head without contrast which was unremarkable.  Followed by MRI brain without contrast which showed punctate stroke/he was admitted to hospital  service, neurology consulted.  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  For which vascular surgery was consulted who recommended outpatient follow-up and elective surgical repaAccording to Dr. Earlier vascular surgery, patient will [facility] of available stent treatment with Dr. Pattricia Boss or potential open aneurysm repair. Patient was a started on aspirin and Plavix for 3 weeks with instructions to continue only aspirin after that.  patient's chronic kidney disease as well as chronic thrombocytopenia remained stable. Transthoracic echo ruled out PFO.  He was started on statin as well.  His blood pressure was elevated for which he was started on amlodipine as well. He was seen by PT OT who recommended CIR.  Neurology cleared him for discharge and signed off.  Insurance approved.  He is being discharged in stable condition.  Discharge Diagnoses:  Principal Problem:   Acute CVA (cerebrovascular accident) (HCC) Active Problems:   Dyslipidemia   Hypertension   Dementia (HCC)   Weakness of both lower extremities   Essential hypertension   Prediabetes   Thrombocytopenia [facility])    Discharge Instructions  Discharge Instructions    Ambulatory referral to Neurology   Complete by: As directed    Follow up with stroke clinic NP (Jessica Vanschaick or Darrol Angel, if both not available, consider Manson Allan, or Ahern) at [facility] in about 4 weeks. Thanks.     Allergies as of 04/06/2021      Reactions   Contrast Media [iodinated  Diagnostic Agents]    Other reaction(s): rash/itching   Red Dye       Medication List    TAKE these medications   amLODipine 10 MG tablet Commonly known as: NORVASC Take 1 tablet (10 mg total) by mouth daily.   aspirin 81 MG EC tablet Take 1 tablet (81 mg total) by mouth daily. Swallow whole. Start taking  on: April 07, 2021   atorvastatin 40 MG tablet Commonly known as: LIPITOR Take 1 tablet (40 mg total) by mouth at bedtime. What changed:   medication strength  how much to take   clopidogrel 75 MG tablet Commonly known as: PLAVIX Take 1 tablet (75 mg total) by mouth daily for 18 days. Start taking on: April 07, 2021   meclizine 25 MG tablet Commonly known as: ANTIVERT Take 12.5 mg by mouth 3 (three) times daily as needed for dizziness or nausea.   vitamin B-12 1000 MCG tablet Commonly known as: CYANOCOBALAMIN Take 1,000 mg by mouth daily.   VITAMIN B-6 PO Take 1 tablet by mouth daily.   vitamin C 500 MG tablet Commonly known as: ASCORBIC ACID Take 500 mg by mouth daily.   Vitamin D (Ergocalciferol) 1.25 MG (50000 UNIT) Caps capsule Commonly known as: DRISDOL Take 500,000 Units by mouth every Monday.       Follow-up Information    [name]. Schedule an appointment as soon as possible for a visit in 4 week(s).   Specialty: Neurology Contact information: [address] [place] [address] [phone]       Elise Benne, MD Follow up in 1 week(s).   Specialty: Internal Medicine Contact information: [address] [place] [address] [phone]              Allergies  Allergen Reactions  . Contrast Media [Iodinated Diagnostic Agents]     Other reaction(s): rash/itching  . Red Dye     Consultations: Neurology and vascular surgery   Procedures/Studies: CT HEAD WO CONTRAST  Result Date: 04/02/2021 CLINICAL DATA:  Acute neurological deficit with bilateral leg weakness and numbness beginning this afternoon. EXAM: CT HEAD WITHOUT CONTRAST TECHNIQUE: Contiguous axial images were obtained from the base of the skull through the vertex without intravenous contrast. COMPARISON:  None. FINDINGS: Brain: Diffuse cerebral atrophy. Ventricular dilatation consistent with central atrophy. Low-attenuation changes in the  deep white matter consistent with small vessel ischemia. No abnormal extra-axial fluid collections. No mass effect or midline shift. Gray-white matter junctions are distinct. Basal cisterns are not effaced. No acute intracranial hemorrhage. Vascular: No hyperdense vessel or unexpected calcification. Skull: Normal. Negative for fracture or focal lesion. Sinuses/Orbits: Postoperative changes in the maxillary antra and ethmoid air cells. Mild mucosal thickening in the paranasal sinuses. No acute air-fluid levels. Mastoid air cells are clear. Other: None. IMPRESSION: 1. No acute intracranial abnormalities. 2. Chronic atrophy and small vessel ischemia. Electronically Signed   By: Burman Nieves M.D.   On: 04/02/2021 19:15   MR ANGIO HEAD WO CONTRAST  Result Date: 04/03/2021 CLINICAL DATA:  Vertigo.  Numbness of the feet.  Dementia. EXAM: MRA HEAD WITHOUT CONTRAST TECHNIQUE: Angiographic images of the Circle of Willis were obtained using MRA technique without intravenous contrast. COMPARISON:  MRI yesterday. FINDINGS: Both internal carotid arteries are widely patent into the brain. No siphon stenosis. The anterior and middle cerebral vessels are patent without proximal stenosis, aneurysm or vascular malformation. Both vertebral arteries are widely patent to the basilar. No basilar stenosis. Posterior circulation  branch vessels appear normal. IMPRESSION: Normal intracranial MR angiography of the large and medium size vessels. Electronically Signed   By: Paulina Fusi M.D.   On: 04/03/2021 09:17   MR BRAIN WO CONTRAST  Result Date: 04/03/2021 CLINICAL DATA:  Initial evaluation for lower extremity numbness. EXAM: MRI HEAD WITHOUT CONTRAST TECHNIQUE: Multiplanar, multiecho pulse sequences of the brain and surrounding structures were obtained without intravenous contrast. COMPARISON:  Prior head CT from earlier the same day. FINDINGS: Brain: Moderately advanced age-related cerebral atrophy. Patchy T2/FLAIR  hyperintensity within the periventricular and deep white matter both cerebral hemispheres most consistent with chronic small vessel ischemic disease, mild for age. Single punctate 5 mm focus of diffusion abnormality seen involving the mesial left temporal lobe, consistent with a small acute ischemic infarct (series 2, image 21). No associated hemorrhage or mass effect. No other diffusion abnormality to suggest acute or subacute ischemia. Gray-white matter differentiation otherwise maintained. No encephalomalacia to suggest chronic cortical infarction elsewhere within the brain. No other evidence for acute or chronic intracranial hemorrhage. No mass lesion, midline shift or mass effect. Mild ventricular prominence related to global parenchymal volume loss without hydrocephalus. No extra-axial fluid collection. Pituitary gland suprasellar region normal. Midline structures intact. Vascular: Major intracranial vascular flow voids are maintained. Skull and upper cervical spine: Craniocervical junction within normal limits. Bone marrow signal intensity normal. No scalp soft tissue abnormality. Sinuses/Orbits: Patient status post bilateral ocular lens replacement. Globes and orbital soft tissues demonstrate no acute finding. Chronic frontoethmoidal sinusitis noted. Mastoid air cells are clear. Inner ear structures grossly normal. Other: None. IMPRESSION: 1. 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 2. No other acute intracranial abnormality. 3. Moderately advanced age-related cerebral atrophy with mild chronic small vessel ischemic disease. 4. Chronic frontoethmoidal sinusitis. Electronically Signed   By: Rise Mu M.D.   On: 04/03/2021 00:18   MR LUMBAR SPINE WO CONTRAST  Result Date: 04/03/2021 CLINICAL DATA:  Initial evaluation for low back pain, cauda equina syndrome suspected. EXAM: MRI LUMBAR SPINE WITHOUT CONTRAST TECHNIQUE: Multiplanar,  multisequence MR imaging of the lumbar spine was performed. No intravenous contrast was administered. COMPARISON:  None available. FINDINGS: Segmentation: Standard. Lowest well-formed disc space labeled the L5-S1 level. Alignment: 4 mm anterolisthesis of L5 on S1, with trace 2 mm retrolisthesis of L4 on L5. Findings chronic and facet mediated. Alignment otherwise normal with preservation of the normal lumbar lordosis. Vertebrae: Vertebral body height maintained without acute or chronic fracture. Bone marrow signal intensity diffusely heterogeneous without discrete or worrisome osseous lesion. No abnormal marrow edema. Conus medullaris and cauda equina: Conus extends to the T12-L1 level. Conus and cauda equina appear normal. Paraspinal and other soft tissues: Paraspinous soft tissues demonstrate no acute finding. Multiple scattered benign appearing cyst noted within the partially visualized kidneys, largest of which measures 4.1 cm on the left. A large irregular multilobulated aneurysm involving the intra-abdominal aorta partially visualized, measuring up to at least 5.3 cm in AP diameter (series 11, image 12). Disc levels: T12-L1: Disc desiccation without significant disc bulge. Anterior endplate osteophytic spurring. No spinal stenosis. Foramina remain patent. L1-2: Disc desiccation with mild disc bulge. Anterior endplate osteophytic spurring. Mild facet and ligament flavum hypertrophy. No significant spinal stenosis. Foramina remain patent. L2-3: Diffuse disc bulge with disc desiccation. Reactive endplate spurring. Mild facet and ligament flavum hypertrophy. No significant spinal stenosis. Foramina remain patent. L3-4: Advanced degenerative intervertebral disc space narrowing with diffuse disc bulge and disc desiccation. Associated reactive  endplate spurring. Mild facet and ligament flavum hypertrophy. No significant spinal stenosis. Mild bilateral L3 foraminal narrowing. No frank impingement. L4-5: Trace  retrolisthesis. Degenerative intervertebral disc space narrowing with diffuse disc bulge and disc desiccation. Superimposed reactive endplate change with marginal endplate osteophytic spurring. Superimposed small central disc protrusion indents the ventral thecal sac (series 14, image 32). Moderate facet and ligament flavum hypertrophy. Resultant mild canal with right worse than left lateral recess stenosis. Moderate right worse than left L4 foraminal narrowing. L5-S1: 4 mm anterolisthesis. Disc desiccation with mild annular disc bulge. Severe right worse than left facet arthrosis with associated small joint effusions. Secondary mild narrowing of the lateral recesses. Central canal remains patent. Moderate right with mild left L5 foraminal narrowing. IMPRESSION: 1. No acute abnormality within the lumbar spine. No evidence for cord compression. 2. Multifactorial degenerative changes at L4-5 with resultant mild spinal stenosis, with moderate right worse than left L4 foraminal narrowing. 3. 4 mm anterolisthesis of L5 on S1 with associated severe right worse than left facet arthrosis, with resultant moderate right L5 foraminal narrowing. 4. Additional more mild degenerative disc bulging and facet hypertrophy at L1-2 thru L3-4 without significant stenosis or neural impingement. 5. Large irregular multilobulated aneurysm involving the intra-abdominal aorta, partially visualized. Further assessment with dedicated CTA of the abdomen and pelvis recommended for further evaluation. Electronically Signed   By: Rise Mu M.D.   On: 04/03/2021 00:28   ECHOCARDIOGRAM COMPLETE  Result Date: 04/03/2021    ECHOCARDIOGRAM REPORT   Patient Name:   Maxwell Rollins Date of Exam: 04/03/2021 Medical Rec #:  [id number]            Height:       74.0 in Accession #:    [id number]           Weight:       203.0 lb Date of Birth:  [birth year]            BSA:          2.187 m Patient Age:    84 years             BP:            164/104 mmHg Patient Gender: M                    HR:           79 bpm. Exam Location:  Inpatient Procedure: 2D Echo, Cardiac Doppler and Color Doppler Indications:    CVA  History:        Patient has no prior history of Echocardiogram examinations.                 Signs/Symptoms:Dementia; Risk Factors:Hypertension,                 Dyslipidemia, Former Smoker and Diabetes. CKD.  Sonographer:    Lavenia Atlas Referring Phys: [id number] SRISHTI L BHAGAT IMPRESSIONS  1. Left ventricular ejection fraction, by estimation, is 60 to 65%. The left ventricle has normal function. The left ventricle has no regional wall motion abnormalities. Left ventricular diastolic parameters are consistent with Grade I diastolic dysfunction (impaired relaxation).  2. Right ventricular systolic function is normal. The right ventricular size is normal. There is normal pulmonary artery systolic pressure.  3. The mitral valve is normal in structure. No evidence of mitral valve regurgitation. No evidence of mitral stenosis.  4. The aortic valve is normal in structure. Aortic valve regurgitation is not visualized. Mild  aortic valve sclerosis is present, with no evidence of aortic valve stenosis.  5. The inferior vena cava is normal in size with greater than 50% respiratory variability, suggesting right atrial pressure of 3 mmHg. FINDINGS  Left Ventricle: Left ventricular ejection fraction, by estimation, is 60 to 65%. The left ventricle has normal function. The left ventricle has no regional wall motion abnormalities. The left ventricular internal cavity size was normal in size. There is  no left ventricular hypertrophy. Left ventricular diastolic parameters are consistent with Grade I diastolic dysfunction (impaired relaxation). Right Ventricle: The right ventricular size is normal. No increase in right ventricular wall thickness. Right ventricular systolic function is normal. There is normal pulmonary artery systolic pressure. The tricuspid  regurgitant velocity is 2.38 m/s, and  with an assumed right atrial pressure of 3 mmHg, the estimated right ventricular systolic pressure is 25.7 mmHg. Left Atrium: Left atrial size was normal in size. Right Atrium: Right atrial size was normal in size. Pericardium: There is no evidence of pericardial effusion. Mitral Valve: The mitral valve is normal in structure. No evidence of mitral valve regurgitation. No evidence of mitral valve stenosis. Tricuspid Valve: The tricuspid valve is normal in structure. Tricuspid valve regurgitation is not demonstrated. No evidence of tricuspid stenosis. Aortic Valve: The aortic valve is normal in structure. Aortic valve regurgitation is not visualized. Mild aortic valve sclerosis is present, with no evidence of aortic valve stenosis. Pulmonic Valve: The pulmonic valve was normal in structure. Pulmonic valve regurgitation is not visualized. No evidence of pulmonic stenosis. Aorta: The aortic root is normal in size and structure. Venous: The inferior vena cava is normal in size with greater than 50% respiratory variability, suggesting right atrial pressure of 3 mmHg. IAS/Shunts: No atrial level shunt detected by color flow Doppler.  LEFT VENTRICLE PLAX 2D LVIDd:         5.00 cm  Diastology LVIDs:         3.40 cm  LV e' medial:    10.40 cm/s LV PW:         1.10 cm  LV E/e' medial:  7.0 LV IVS:        1.10 cm  LV e' lateral:   6.42 cm/s LVOT diam:     2.40 cm  LV E/e' lateral: 11.4 LV SV:         71 LV SV Index:   32 LVOT Area:     4.52 cm  RIGHT VENTRICLE RV Basal diam:  2.30 cm RV S prime:     20.00 cm/s TAPSE (M-mode): 3.0 cm LEFT ATRIUM             Index       RIGHT ATRIUM           Index LA diam:        3.80 cm 1.74 cm/m  RA Area:     16.50 cm LA Vol (A2C):   50.9 ml 23.27 ml/m RA Volume:   39.80 ml  18.20 ml/m LA Vol (A4C):   29.1 ml 13.30 ml/m LA Biplane Vol: 41.6 ml 19.02 ml/m  AORTIC VALVE LVOT Vmax:   79.00 cm/s LVOT Vmean:  45.400 cm/s LVOT VTI:    0.156 m  AORTA Ao  Root diam: 3.40 cm MITRAL VALVE               TRICUSPID VALVE MV Area (PHT): 7.99 cm    TR Peak grad:   22.7 mmHg MV Decel Time: 95 msec  TR Vmax:        238.00 cm/s MV E velocity: 73.30 cm/s MV A velocity: 87.80 cm/s  SHUNTS MV E/A ratio:  0.83        Systemic VTI:  0.16 m                            Systemic Diam: 2.40 cm Thurmon Fair MD Electronically signed by Thurmon Fair MD Signature Date/Time: 04/03/2021/2:34:57 PM    Final    CT Angio Chest/Abd/Pel for Dissection W and/or Wo Contrast  Result Date: 04/03/2021 CLINICAL DATA:  85 year old male with chest and back pain, and lower extremity weakness. Suspect aortic aneurysm and dissection EXAM: CT ANGIOGRAPHY CHEST, ABDOMEN AND PELVIS TECHNIQUE: Non-contrast CT of the chest was initially obtained. Multidetector CT imaging through the chest, abdomen and pelvis was performed using the standard protocol during bolus administration of intravenous contrast. Multiplanar reconstructed images and MIPs were obtained and reviewed to evaluate the vascular anatomy. CONTRAST:  OMNIPAQUE IOHEXOL 350 MG/ML SOLN COMPARISON:  None. FINDINGS: CTA CHEST FINDINGS Cardiovascular: No evidence of acute intramural hematoma on the initial noncontrast enhanced images. The aortic root is normal in caliber. There is significant motion artifact, but no definitive abnormality. Mildly ectatic but nonaneurysmal ascending thoracic aorta with a maximal diameter of 3.9 cm. No evidence of thoracic aortic dissection. Conventional 2 vessel arch anatomy. Mild elongation of the aortic isthmus and proximal descending thoracic aorta. The heart is at the upper limits of normal for size. Calcifications noted along the coronary arteries. No pericardial effusion. Normal caliber main and central pulmonary arteries. No central PE. Mediastinum/Nodes: Unremarkable CT appearance of the thyroid gland. No suspicious mediastinal or hilar adenopathy. No soft tissue mediastinal mass. The thoracic  esophagus is unremarkable. Lungs/Pleura: Extensive respiratory motion artifact significantly limits evaluation of the lungs for pulmonary nodules. Extensive peripheral subpleural reticulation and architectural distortion throughout the lungs most significant in the lower lobes. Assessment for honeycomb in is limited by the respiratory motion artifact. Overall, findings are consistent with pulmonary fibrosis. No pneumothorax or pleural effusion. Musculoskeletal: No acute fracture or aggressive appearing lytic or blastic osseous lesion. Review of the MIP images confirms the above findings. CTA ABDOMEN AND PELVIS FINDINGS VASCULAR Aorta: Extremely tortuous abdominal aorta with a tri lobed juxtarenal abdominal aortic aneurysm. At the level of the renal arteries, the aorta makes an approximately 79 degree angle toward the right for several cm before making a second 79 degree angle inferiorly toward the bifurcation. This results in a zigzag configuration of the aneurysmal abdominal aorta. The largest aneurysmal segment is the most proximal juxtarenal portion of the aneurysm which measures up to 6.2 cm in diameter (see coronal reformatted image 69 of series 9). The more distal infrarenal segment measures up to 4.5 cm in diameter. The aneurysm extends from the origin of the right renal artery to the aortic bifurcation. No associated inflammatory changes or retroperitoneal stranding to suggest impending rupture. Celiac: The common hepatic and splenic arteries have separate origins from the abdominal aorta. A beaded appearance at the origin of the splenic artery may represent underlying fibromuscular dysplasia. No evidence of dissection, significant stenosis or significant aneurysm. SMA: Patent without evidence of aneurysm, dissection, vasculitis or significant stenosis. The SMA arises at the level of the initial aneurysmal portion of the abdominal aorta. Renals: The right renal artery arises at the level of the SMA and at  the beginning of the aneurysmal segment of the abdominal  aorta. The artery is patent without evidence of dissection or fibromuscular dysplasia. The left renal artery arises from the anterior aspect of the most aneurysmal segment of the aorta and appears diffusely diseased. There is a small accessory artery to the lower pole of the left kidney arising from the distal aneurysmal segment of the aorta. IMA: Patent.  Suspect at least mild stenosis at the origin. Inflow: No evidence of dissection or aneurysm involving the iliac arteries. Scattered atherosclerotic plaque. Both internal iliac arteries remain widely patent. The external iliac arteries are relatively spared from disease. The visualized common femoral, superficial femoral and profunda femoral arteries are also relatively spared from disease. Veins: No focal venous abnormality. Review of the MIP images confirms the above findings. NON-VASCULAR Hepatobiliary: No focal liver abnormality is seen. No gallstones, gallbladder wall thickening, or biliary dilatation. Pancreas: Unremarkable. No pancreatic ductal dilatation or surrounding inflammatory changes. Spleen: Normal in size without focal abnormality. Adrenals/Urinary Tract: Unremarkable adrenal glands. No evidence of hydronephrosis or nephrolithiasis. Mild atrophy of the left kidney likely secondary to chronic arterial insufficiency. 4 cm simple cyst exophytic from the posterior lower pole of the left kidney. 5.4 cm simple cyst exophytic from the upper pole of the right kidney. Unremarkable ureters and bladder. Stomach/Bowel: No evidence of obstruction or focal bowel wall thickening. Normal appendix in the right lower quadrant. The terminal ileum is unremarkable. Lymphatic: No suspicious lymphadenopathy. Reproductive: Mild prostatomegaly. Other: No abdominal wall hernia or abnormality. No abdominopelvic ascites. Musculoskeletal: No acute fracture or aggressive appearing lytic or blastic osseous lesion.  Multilevel degenerative disc disease. Review of the MIP images confirms the above findings. IMPRESSION: CTA CHEST 1. No evidence of thoracic aortic dissection or aneurysm. 2. The thoracic aorta is mildly ectatic but likely within normal limits for age at 3.9 cm. 3. Coronary artery calcifications. 4. Very limited evaluation of the lungs due to extensive respiratory motion artifact. However, CT findings suggest underlying pulmonary fibrosis, perhaps related to interstitial lung disease such as usual interstitial pneumonitis. CTA ABD/PELVIS 1. 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 The aneurysmal segment of the aorta is highly tortuous with a zigzag shaped including two nearly 80 degree angulations. No evidence of rupture/impending rupture at this time. Recommend referral to a vascular specialist. This recommendation follows ACR consensus guidelines: White Paper of the ACR Incidental Findings Committee II on Vascular Findings. J Am Coll Radiol 2013; 10:789-794. 2. 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 3. Mild left renal atrophy likely related to chronic ischemia from renal artery stenosis. 4. Bilateral renal cysts. 5. Prostatomegaly. Signed, Sterling BigHeath K. McCullough, MD, RPVI [facility] Electronically Signed   By: Malachy MoanHeath  McCullough M.D.   On: 04/03/2021 06:19   VAS US CAROTID  Result Date: 04/04/2021 Carotid Arterial Duplex Study Indications:       CVA. Risk Factors:      Hypertension, hyperlipidemia, no history of smoking. Comparison Study:  No previous exams Performing Technologist: Ernestene MentionJody Hill  Examination Guidelines: A complete evaluation includes B-mode imaging, spectral Doppler, color Doppler, and power Doppler as needed of all accessible portions of each vessel. Bilateral testing is considered an  integral part of a complete examination. Limited examinations for reoccurring indications may be performed as noted.  Right Carotid Findings: +----------+--------+--------+--------+------------------+------------------+           PSV cm/sEDV cm/sStenosisPlaque DescriptionComments           +----------+--------+--------+--------+------------------+------------------+  CCA Prox  55      14                                intimal thickening +----------+--------+--------+--------+------------------+------------------+ CCA Distal67      17                                intimal thickening +----------+--------+--------+--------+------------------+------------------+ ICA Prox  51      15                                                   +----------+--------+--------+--------+------------------+------------------+ ICA Distal55      16                                                   +----------+--------+--------+--------+------------------+------------------+ ECA       69      14                                                   +----------+--------+--------+--------+------------------+------------------+ +----------+--------+-------+----------------+-------------------+           PSV cm/sEDV cmsDescribe        Arm Pressure (mmHG) +----------+--------+-------+----------------+-------------------+ [id number]             Multiphasic, WNL                    +----------+--------+-------+----------------+-------------------+ +---------+--------+--+--------+-+---------+ VertebralPSV cm/s37EDV cm/s8Antegrade +---------+--------+--+--------+-+---------+  Left Carotid Findings: +----------+--------+--------+--------+------------------+------------------+           PSV cm/sEDV cm/sStenosisPlaque DescriptionComments           +----------+--------+--------+--------+------------------+------------------+ CCA Prox  77      18                                intimal  thickening +----------+--------+--------+--------+------------------+------------------+ CCA Distal79      20                                intimal thickening +----------+--------+--------+--------+------------------+------------------+ ICA Prox  46      16                                                   +----------+--------+--------+--------+------------------+------------------+ ICA Distal62      20                                                   +----------+--------+--------+--------+------------------+------------------+ ECA       51      10                                                   +----------+--------+--------+--------+------------------+------------------+ +----------+--------+--------+----------------+-------------------+  PSV cm/sEDV cm/sDescribe        Arm Pressure (mmHG) +----------+--------+--------+----------------+-------------------+ [id number]              Multiphasic, WNL                    +----------+--------+--------+----------------+-------------------+ +---------+--------+--+--------+--+---------+ VertebralPSV cm/s39EDV cm/s11Antegrade +---------+--------+--+--------+--+---------+   Summary: Right Carotid: The extracranial vessels were near-normal with only minimal wall                thickening or plaque. Left Carotid: The extracranial vessels were near-normal with only minimal wall               thickening or plaque. Vertebrals:  Bilateral vertebral arteries demonstrate antegrade flow. Subclavians: Normal flow hemodynamics were seen in bilateral subclavian              arteries. *See table(s) above for measurements and observations.  Electronically signed by Delia Heady MD on 04/04/2021 at 8:23:52 AM.    Final    VAS [place] LOWER EXTREMITY VENOUS (DVT)  Result Date: 04/04/2021  Lower Venous DVT Study Indications: Stroke.  Comparison Study: No previous exams Performing Technologist: Ernestene Mention  Examination Guidelines: A  complete evaluation includes B-mode imaging, spectral Doppler, color Doppler, and power Doppler as needed of all accessible portions of each vessel. Bilateral testing is considered an integral part of a complete examination. Limited examinations for reoccurring indications may be performed as noted. The reflux portion of the exam is performed with the patient in reverse Trendelenburg.  +---------+---------------+---------+-----------+----------+--------------+ RIGHT    CompressibilityPhasicitySpontaneityPropertiesThrombus Aging +---------+---------------+---------+-----------+----------+--------------+ CFV      Full           Yes      Yes                                 +---------+---------------+---------+-----------+----------+--------------+ SFJ      Full                                                        +---------+---------------+---------+-----------+----------+--------------+ FV Prox  Full           Yes      Yes                                 +---------+---------------+---------+-----------+----------+--------------+ FV Mid   Full           Yes      Yes                                 +---------+---------------+---------+-----------+----------+--------------+ FV DistalFull           Yes      Yes                                 +---------+---------------+---------+-----------+----------+--------------+ PFV      Full                                                        +---------+---------------+---------+-----------+----------+--------------+  POP      Full           Yes      Yes                                 +---------+---------------+---------+-----------+----------+--------------+ PTV      Full                                                        +---------+---------------+---------+-----------+----------+--------------+ PERO     Full                                                         +---------+---------------+---------+-----------+----------+--------------+   +---------+---------------+---------+-----------+----------+--------------+ LEFT     CompressibilityPhasicitySpontaneityPropertiesThrombus Aging +---------+---------------+---------+-----------+----------+--------------+ CFV      Full           Yes      Yes                                 +---------+---------------+---------+-----------+----------+--------------+ SFJ      Full                                                        +---------+---------------+---------+-----------+----------+--------------+ FV Prox  Full           Yes      Yes                                 +---------+---------------+---------+-----------+----------+--------------+ FV Mid   Full           Yes      Yes                                 +---------+---------------+---------+-----------+----------+--------------+ FV DistalFull           Yes      Yes                                 +---------+---------------+---------+-----------+----------+--------------+ PFV      Full                                                        +---------+---------------+---------+-----------+----------+--------------+ POP      Full           Yes      Yes                                 +---------+---------------+---------+-----------+----------+--------------+ PTV  Full                                                        +---------+---------------+---------+-----------+----------+--------------+ PERO     Full                                                        +---------+---------------+---------+-----------+----------+--------------+     Summary: BILATERAL: - No evidence of deep vein thrombosis seen in the lower extremities, bilaterally. - No evidence of superficial venous thrombosis in the lower extremities, bilaterally. -No evidence of popliteal cyst, bilaterally.   *See table(s) above for measurements  and observations. Electronically signed by Gretta Began MD on 04/04/2021 at 6:00:22 AM.    Final       Discharge Exam: Vitals:   04/06/21 0900 04/06/21 1100  BP: (!) 144/103 (!) 139/103  Pulse: 78 82  Resp: 17 18  Temp: 98.2 F (36.8 C) 98.2 F (36.8 C)  SpO2: 95% 96%   Vitals:   04/06/21 0352 04/06/21 0700 04/06/21 0900 04/06/21 1100  BP: (!) 155/105 (!) 153/98 (!) 144/103 (!) 139/103  Pulse: 89 76 78 82  Resp: 18 18 17 18   Temp: 98 F (36.7 C) 98.2 F (36.8 C) 98.2 F (36.8 C) 98.2 F (36.8 C)  TempSrc: Oral Oral Oral Oral  SpO2: 96% 96% 95% 96%  Weight:      Height:        General: Pt is alert, awake, not in acute distress Cardiovascular: RRR, S1/S2 +, no rubs, no gallops Respiratory: CTA bilaterally, no wheezing, no rhonchi Abdominal: Soft, NT, ND, bowel sounds + Extremities: no edema, no cyanosis    The results of significant diagnostics from this hospitalization (including imaging, microbiology, ancillary and laboratory) are listed below for reference.     Microbiology: Recent Results (from the past 240 hour(s))  Resp Panel by RT-PCR (Flu A&B, Covid) Nasopharyngeal Swab     Status: None   Collection Time: 04/02/21  6:36 PM   Specimen: Nasopharyngeal Swab; Nasopharyngeal(NP) swabs in vial transport medium  Result Value Ref Range Status   SARS Coronavirus 2 by RT PCR NEGATIVE NEGATIVE Final    Comment: (NOTE) SARS-CoV-2 target nucleic acids are NOT DETECTED.  The SARS-CoV-2 RNA is generally detectable in upper respiratory specimens during the acute phase of infection. The lowest concentration of SARS-CoV-2 viral copies this assay can detect is 138 copies/mL. A negative result does not preclude SARS-Cov-2 infection and should not be used as the sole basis for treatment or other patient management decisions. A negative result may occur with  improper specimen collection/handling, submission of specimen other than nasopharyngeal swab, presence of viral  mutation(s) within the areas targeted by this assay, and inadequate number of viral copies(<138 copies/mL). A negative result must be combined with clinical observations, patient history, and epidemiological information. The expected result is Negative.  Fact Sheet for Patients:  BloggerCourse.com  Fact Sheet for Healthcare Providers:  SeriousBroker.it  This test is no t yet approved or cleared by the [place] FDA and  has been authorized for detection and/or diagnosis of SARS-CoV-2 by FDA under an Emergency Use Authorization (EUA). This EUA will  remain  in effect (meaning this test can be used) for the duration of the COVID-19 declaration under Section 564(b)(1) of the Act, 21 U.S.C.section 360bbb-3(b)(1), unless the authorization is terminated  or revoked sooner.       Influenza A by PCR NEGATIVE NEGATIVE Final   Influenza B by PCR NEGATIVE NEGATIVE Final    Comment: (NOTE) The Xpert Xpress SARS-CoV-2/FLU/RSV plus assay is intended as an aid in the diagnosis of influenza from Nasopharyngeal swab specimens and should not be used as a sole basis for treatment. Nasal washings and aspirates are unacceptable for Xpert Xpress SARS-CoV-2/FLU/RSV testing.  Fact Sheet for Patients: BloggerCourse.com  Fact Sheet for Healthcare Providers: SeriousBroker.it  This test is not yet approved or cleared by the [place] FDA and has been authorized for detection and/or diagnosis of SARS-CoV-2 by FDA under an Emergency Use Authorization (EUA). This EUA will remain in effect (meaning this test can be used) for the duration of the COVID-19 declaration under Section 564(b)(1) of the Act, 21 U.S.C. section 360bbb-3(b)(1), unless the authorization is terminated or revoked.  Performed at [facility], [address], [place], [place] [address]      Labs: BNP (last 3 results) No results  for input(s): BNP in the last 8760 hours. Basic Metabolic Panel: Recent Labs  Lab 04/02/21 1848 04/02/21 1852 04/04/21 0839 04/05/21 0400  NA 140 140 141 137  K 4.2 4.2 3.9 3.8  CL 106 103 106 105  CO2 27  --  28 29  GLUCOSE 105* 100* 97 113*  BUN 19 19 17 18   CREATININE 1.49* 1.50* 1.29* 1.36*  CALCIUM 8.6*  --  8.7* 8.4*  MG  --   --  2.1  --    Liver Function Tests: Recent Labs  Lab 04/02/21 1848  AST 16  ALT 15  ALKPHOS 93  BILITOT 0.7  PROT 6.2*  ALBUMIN 3.5   No results for input(s): LIPASE, AMYLASE in the last 168 hours. No results for input(s): AMMONIA in the last 168 hours. CBC: Recent Labs  Lab 04/02/21 1848 04/02/21 1852 04/04/21 0839  WBC 8.2  --  7.9  NEUTROABS 4.9  --   --   HGB 14.1 15.0 13.8  HCT 43.4 44.0 41.4  MCV 91.4  --  88.8  PLT 146*  --  141*   Cardiac Enzymes: No results for input(s): CKTOTAL, CKMB, CKMBINDEX, TROPONINI in the last 168 hours. BNP: Invalid input(s): POCBNP CBG: No results for input(s): GLUCAP in the last 168 hours. D-Dimer No results for input(s): DDIMER in the last 72 hours. Hgb A1c No results for input(s): HGBA1C in the last 72 hours. Lipid Profile No results for input(s): CHOL, HDL, LDLCALC, TRIG, CHOLHDL, LDLDIRECT in the last 72 hours. Thyroid function studies No results for input(s): TSH, T4TOTAL, T3FREE, THYROIDAB in the last 72 hours.  Invalid input(s): FREET3 Anemia work up No results for input(s): VITAMINB12, FOLATE, FERRITIN, TIBC, IRON, RETICCTPCT in the last 72 hours. Urinalysis    Component Value Date/Time   COLORURINE YELLOW 04/03/2021 0203   APPEARANCEUR CLEAR 04/03/2021 0203   LABSPEC 1.012 04/03/2021 0203   PHURINE 5.0 04/03/2021 0203   GLUCOSEU NEGATIVE 04/03/2021 0203   HGBUR SMALL (A) 04/03/2021 0203   BILIRUBINUR NEGATIVE 04/03/2021 0203   KETONESUR 5 (A) 04/03/2021 0203   PROTEINUR NEGATIVE 04/03/2021 0203   NITRITE NEGATIVE 04/03/2021 0203   LEUKOCYTESUR NEGATIVE 04/03/2021 0203    Sepsis Labs Invalid input(s): PROCALCITONIN,  WBC,  LACTICIDVEN Microbiology Recent Results (  from the past 240 hour(s))  Resp Panel by RT-PCR (Flu A&B, Covid) Nasopharyngeal Swab     Status: None   Collection Time: 04/02/21  6:36 PM   Specimen: Nasopharyngeal Swab; Nasopharyngeal(NP) swabs in vial transport medium  Result Value Ref Range Status   SARS Coronavirus 2 by RT PCR NEGATIVE NEGATIVE Final    Comment: (NOTE) SARS-CoV-2 target nucleic acids are NOT DETECTED.  The SARS-CoV-2 RNA is generally detectable in upper respiratory specimens during the acute phase of infection. The lowest concentration of SARS-CoV-2 viral copies this assay can detect is 138 copies/mL. A negative result does not preclude SARS-Cov-2 infection and should not be used as the sole basis for treatment or other patient management decisions. A negative result may occur with  improper specimen collection/handling, submission of specimen other than nasopharyngeal swab, presence of viral mutation(s) within the areas targeted by this assay, and inadequate number of viral copies(<138 copies/mL). A negative result must be combined with clinical observations, patient history, and epidemiological information. The expected result is Negative.  Fact Sheet for Patients:  BloggerCourse.com  Fact Sheet for Healthcare Providers:  SeriousBroker.it  This test is no t yet approved or cleared by the [place] FDA and  has been authorized for detection and/or diagnosis of SARS-CoV-2 by FDA under an Emergency Use Authorization (EUA). This EUA will remain  in effect (meaning this test can be used) for the duration of the COVID-19 declaration under Section 564(b)(1) of the Act, 21 U.S.C.section 360bbb-3(b)(1), unless the authorization is terminated  or revoked sooner.       Influenza A by PCR NEGATIVE NEGATIVE Final   Influenza B by PCR NEGATIVE NEGATIVE Final     Comment: (NOTE) The Xpert Xpress SARS-CoV-2/FLU/RSV plus assay is intended as an aid in the diagnosis of influenza from Nasopharyngeal swab specimens and should not be used as a sole basis for treatment. Nasal washings and aspirates are unacceptable for Xpert Xpress SARS-CoV-2/FLU/RSV testing.  Fact Sheet for Patients: BloggerCourse.com  Fact Sheet for Healthcare Providers: SeriousBroker.it  This test is not yet approved or cleared by the [place] FDA and has been authorized for detection and/or diagnosis of SARS-CoV-2 by FDA under an Emergency Use Authorization (EUA). This EUA will remain in effect (meaning this test can be used) for the duration of the COVID-19 declaration under Section 564(b)(1) of the Act, 21 U.S.C. section 360bbb-3(b)(1), unless the authorization is terminated or revoked.  Performed at [facility], [address], [place], [place] [address]      Time coordinating discharge: Over 30 minutes  SIGNED:   Hughie Closs, MD  [facility] 04/06/2021, 12:15 PM  If 7PM-7AM, please contact night-coverage [email or web address]

## 2021-04-06 NOTE — Progress Notes (Signed)
Patient arrived from [facility] at 1545 this afternoon in a hospital bed. No complaints of pain, admission book reviewed with patient, armband placed, bedding and gown changed, personal belongings, call light, and phone within reach at bedside.

## 2021-04-06 NOTE — Progress Notes (Signed)
CSW notified by family that they are at the hospital. CSW took new HCPOA paperwork to the bedside to be completed, and paged chaplain to witness and notarize. Chaplain notary schedule has now changed and there is not notary coverage on Fridays, so appointment is scheduled to complete HCPOA paperwork on CIR on Monday 4/11 at 3:30 pm. CSW notified rehab admissions coordinator and family.  Blenda Nicely, [place] Clinical Social Worker [phone]

## 2021-04-07 DIAGNOSIS — I639 Cerebral infarction, unspecified: Secondary | ICD-10-CM

## 2021-04-07 MED ORDER — AMLODIPINE BESYLATE 10 MG PO TABS
10.0000 mg | ORAL_TABLET | Freq: Every day | ORAL | Status: DC
  Administered 2021-04-07 – 2021-04-24 (×18): 10 mg via ORAL
  Filled 2021-04-07 (×18): qty 1

## 2021-04-07 NOTE — Evaluation (Signed)
Physical Therapy Assessment and Plan  Patient Details  Name: Maxwell Rollins MRN: [id number] Date of Birth: 1936-09-05  PT Diagnosis: Abnormal posture, Cognitive deficits, Coordination disorder, Difficulty walking, Impaired cognition, Impaired sensation, Low back pain and Muscle weakness Rehab Potential: Fair ELOS: ~2.5 weeks   Today's Date: 04/07/2021 PT Individual Time: 1120-1225 PT Individual Time Calculation (min): 65 min    Hospital Problem: Principal Problem:   Acute CVA (cerebrovascular accident) ([place]) Active Problems:   Stroke (cerebrum) ([place])   Past Medical History:  Past Medical History:  Diagnosis Date  . COVID-19 2019   Was hospitalized/ has had dizzines on and off since  . Dementia ([place])   . Dyslipidemia   . Hypertension    Past Surgical History:  Past Surgical History:  Procedure Laterality Date  . NASAL POLYP EXCISION     Surgeries X 5 for removal    Assessment & Plan Clinical Impression: Patient is a 85 y.o. year old male with history of dyslipidemia, Covid 19 2021 with resultant dizziness/balance problems, 3 week history of weakness who was admitted on 04/02/2021 with B/l LE numbness with reports of  back pain and inability to walk. He was noted to have some confusion with ?history of dementia. UDS negative. MRI/MRA brain done revealing punctate infarct in mesial left temporal lobe and moderately advanced age related cerebral atrophy and mild chronic small vessel disease. 2D echocardiogram with EF of 60-65%, no wall abnormalities. Stroke felt to be due to small vessel disease. Dr Erlinda Hong recommended DAPT X 3 weeks followed by ASA alone and that symptoms due to lumbar stenosis.   MRI lumbar spine multifactorial degenerative changes in L4-5 with mild spinal stenosis and moderate R>L L4 foraminal stenosis and L5 on S1 anterolisthesis with severe right >left facet arthrosis and moderate right L5 foraminal narrowing and large multi lobulated aneurysm.  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 Dr. Donnetta Hutching consulted for input and will follow along. Patient will need consideration for elected repair and would need eventual referral to [facility] (Dr. Sammuel Hines) for off label graft Tx or potential open repair. He continues to have problems with mobility and therapy is working on pregait activity.  Patient transferred to CIR on 04/06/2021 .   Patient currently requires max with mobility secondary to muscle weakness and muscle joint tightness, impaired timing and sequencing, motor apraxia, decreased coordination and decreased motor planning, decreased motor planning, decreased initiation, decreased awareness, decreased problem solving, decreased safety awareness, decreased memory and delayed processing,   and decreased sitting balance, decreased standing balance, decreased postural control and decreased balance strategies.  Prior to hospitalization, patient was independent  with mobility and lived with Spouse in a House home.  Home access is 0Level entry.  Patient will benefit from skilled PT intervention to maximize safe functional mobility, minimize fall risk and decrease caregiver burden for planned discharge home with 24 hour assist.  Anticipate patient will benefit from follow up OP at discharge.  PT - End of Session Activity Tolerance: Tolerates 30+ min activity with multiple rests Endurance Deficit: Yes PT Assessment Rehab Potential (ACUTE/IP ONLY): Fair PT Barriers to Discharge: Incontinence;Home environment access/layout;Other (comments) PT Barriers to Discharge Comments: Hx of dementia PT Patient demonstrates impairments in the following area(s): Safety;Motor;Sensory;Pain;Skin Integrity;Balance;Endurance PT Transfers Functional Problem(s): Bed to Chair;Bed Mobility;Car;Furniture PT Locomotion Functional  Problem(s): Ambulation;Stairs PT Plan PT Intensity: Minimum of 1-2 x/day ,  45 to 90 minutes PT Frequency: 5 out of 7 days PT Duration Estimated Length of Stay: ~2.5 weeks PT Treatment/Interventions: Ambulation/gait training;Cognitive remediation/compensation;Discharge planning;DME/adaptive equipment instruction;Functional mobility training;Pain management;Psychosocial support;Splinting/orthotics;Therapeutic Activities;UE/LE Strength taining/ROM;Visual/perceptual remediation/compensation;Balance/vestibular training;Community reintegration;Disease management/prevention;Functional electrical stimulation;Neuromuscular re-education;Patient/family education;Skin care/wound management;Stair training;Therapeutic Exercise;UE/LE Coordination activities;Wheelchair propulsion/positioning PT Transfers Anticipated Outcome(s): CGA using LRAD PT Locomotion Anticipated Outcome(s): CGA using LRAD PT Recommendation Recommendations for Other Services: Neuropsych consult Follow Up Recommendations: Outpatient PT;24 hour supervision/assistance Patient destination: Home Equipment Recommended: To be determined   PT Evaluation Precautions/Restrictions Precautions Precautions: Fall;Other (comment) Precaution Comments: Monitor BP. Posterior lean Restrictions Weight Bearing Restrictions: No Pain Pain Assessment Pain Scale: 0-10 Pain Score: 1  Pain Location: Back Pain Onset: On-going Home Living/Prior Functioning Home Living Available Help at Discharge: Family;Available 24 hours/day Type of Home: House Home Access: Level entry Entrance Stairs-Number of Steps: 0 Home Layout: Two level;Able to live on main level with bedroom/bathroom;1/2 bath on main level Alternate Level Stairs-Number of Steps: 14 steps, plateu, 8 steps to 2nd floor Bathroom Shower/Tub: Walk-in shower  Lives With: Spouse Prior Function Level of Independence: Independent with basic ADLs;Independent with transfers;Independent with gait  Able  to Take Stairs?: Yes Driving: Yes Vocation: Retired Comments: patient independent with all adls Perception  Perception Perception: Impaired Spatial Orientation: impaired for midline and lateral balance awareness Praxis Praxis: Impaired Praxis Impairment Details: Motor planning;Initiation  Cognition Overall Cognitive Status: No family/caregiver present to determine baseline cognitive functioning (PMH of dementia) Arousal/Alertness: Awake/alert Orientation Level: Oriented to person;Oriented to place;Oriented to situation;Other (comment);Disoriented to time (Oriented to city, but not hospital. Oriented to year, but not month/date.) Memory: Impaired Awareness: Impaired Problem Solving: Impaired Executive Function: Initiating;Self Correcting Initiating: Impaired Self Correcting: Impaired Safety/Judgment: Impaired Sensation Sensation Light Touch: Impaired by gross assessment Hot/Cold: Not tested Proprioception: Not tested Stereognosis: Not tested Additional Comments: Crude touch present throughout. Light touch diminished bilaterally below knees. Coordination Gross Motor Movements are Fluid and Coordinated: No Fine Motor Movements are Fluid and Coordinated: Not tested Coordination and Movement Description: Rigidity in movement (particularly B knee flex and DF). Generalized weakness. Motor  Motor Motor: Abnormal postural alignment and control;Abnormal tone;Motor apraxia Motor - Skilled Clinical Observations: Rigid knee and ankle movements. Unable to advance LE when cued to in standing.   Trunk/Postural Assessment  Cervical Assessment Cervical Assessment: Exceptions to [facility] (Slight forward head, likely due to thoracic kyphosis) Thoracic Assessment Thoracic Assessment: Exceptions to [facility] (Kyphotic) Lumbar Assessment Lumbar Assessment: Exceptions to [facility] (Posterior pelvic tilt) Postural Control Trunk Control: Very weak Righting Reactions: Posterior lean in sitting and standing   Balance Balance Balance Assessed: Yes Static Sitting Balance Static Sitting - Level of Assistance: 3: Mod assist Static Standing Balance Static Standing - Level of Assistance: 2: Max assist Extremity Assessment      RLE Assessment RLE Assessment: Exceptions to [facility] Passive Range of Motion (PROM) Comments: Unable to flex knee past ~110 RLE Strength Right Hip Flexion: 4/5 Right Knee Flexion: 3/5 Right Knee Extension: 5/5 Right Ankle Dorsiflexion: 4/5 Right Ankle Plantar Flexion: 5/5 LLE Assessment LLE Assessment: Exceptions to [facility] Passive Range of Motion (PROM) Comments: Unable to flex knee past ~110. Unable to DF past neutral. LLE Strength Left Hip Flexion: 4/5 Left Knee Flexion: 3/5 Left Knee Extension: 5/5 Left Ankle Plantar Flexion: 4/5  Care Tool Care Tool Bed Mobility Roll left and right activity   Roll left and right assist level: Moderate Assistance - Patient 50 - 74%    Sit to lying activity   Sit to  lying assist level: Maximal Assistance - Patient 25 - 49%    Lying to sitting edge of bed activity   Lying to sitting edge of bed assist level: Maximal Assistance - Patient 25 - 49%     Care Tool Transfers Sit to stand transfer   Sit to stand assist level: Moderate Assistance - Patient 50 - 74%    Chair/bed transfer   Chair/bed transfer assist level: Moderate Assistance - Patient 50 - 74%     Psychologist, counselling transfer activity did not occur: Safety/medical concerns        Care Tool Locomotion Ambulation Ambulation activity did not occur: Safety/medical concerns        Walk 10 feet activity Walk 10 feet activity did not occur: Safety/medical concerns       Walk 50 feet with 2 turns activity Walk 50 feet with 2 turns activity did not occur: Safety/medical concerns      Walk 150 feet activity Walk 150 feet activity did not occur: Safety/medical concerns      Walk 10 feet on uneven surfaces activity Walk 10 feet on uneven  surfaces activity did not occur: Safety/medical concerns      Stairs Stair activity did not occur: Safety/medical concerns        Walk up/down 1 step activity Walk up/down 1 step or curb (drop down) activity did not occur: Safety/medical concerns        Walk up/down 4 steps activity  Walk up/down 4 steps activity did not occur: Safety/medical concerns    Walk up/down 12 steps activity Walk up/down 12 steps activity did not occur: Safety/medical concerns      Pick up small objects from floor Pick up small object from the floor (from standing position) activity did not occur: Safety/medical concerns      Wheelchair Will patient use wheelchair at discharge?:  (TBD)          Wheel 50 feet with 2 turns activity      Wheel 150 feet activity        Refer to Care Plan for Long Term Goals  SHORT TERM GOAL WEEK 1 PT Short Term Goal 1 (Week 1): Pt will perform stand transfer from bed to chair with min assist. PT Short Term Goal 2 (Week 1): Pt will perform sit to stand transfer with min assist using LRAD. PT Short Term Goal 3 (Week 1): Pt will ambulate 27f with min assist using LRAD. PT Short Term Goal 4 (Week 1): Pt will perform bed mobility with mod assist.  Recommendations for other services: Neuropsych  Skilled Therapeutic Intervention Evaluation completed (see details above) with patient education regarding purpose of PT evaluation, PT POC and goals, therapy schedule, weekly team meetings, and other CIR information including safety plan and fall risk safety. Pt received sitting in WC and agreeable to therapy session. Pt performed the mobility tasks listed below with the specified assistance levels. Pt demonstrated heavy posterior lean in sitting and in standing, necessitating mod assist for balance to prevent posterior fall. Gait training attempted, but pt unable to advance LE. Pt hypotensive vitals limiting mobility during session requiring constant BP monitoring (see details  below). Thigh high ted hose donned max assist to assist with elevating BP. RN entered room during session to assess cognition and notified of BPs (listed below). RN left and therapist assumed pt care. Pt left in recliner with legs elevated to elevate BP. Seat belt alarm turned  on and needs within reach.   Vitals: BP (MAP), HR Sitting after reporting dizziness in standing: 87/63 (72), 70 After seated arm exercises: 86/65 (72), 70 Standing after few min of sitting: 61/45 (52), 74 Sitting again after standing: 93/72 (80), 70 After donning compression socks in sitting: 79/63 (70), 67 BP cuff moved to R arm in sitting for comparison: 83/64 (70), 70 After transferring to recliner with legs elevated: 100/74 (83), 67   Mobility Transfers Transfers: Squat Pivot Transfers;Sit to Stand;Stand to Sit Sit to Stand: Moderate Assistance - Patient 50-74% Stand to Sit: Moderate Assistance - Patient 50-74% Squat Pivot Transfers: Moderate Assistance - Patient 50-74% Transfer (Assistive device): None Locomotion  Gait Ambulation: No Gait Gait: No Stairs / Additional Locomotion Stairs: No Wheelchair Mobility Wheelchair Mobility: No   Discharge Criteria: Patient will be discharged from PT if patient refuses treatment 3 consecutive times without medical reason, if treatment goals not met, if there is a change in medical status, if patient makes no progress towards goals or if patient is discharged from hospital.  The above assessment, treatment plan, treatment alternatives and goals were discussed and mutually agreed upon: by patient  Eleonore Chiquito, SPT 04/07/2021, 5:18 PM

## 2021-04-07 NOTE — Evaluation (Addendum)
Speech Language Pathology Assessment and Plan  Patient Details  Name: Maxwell Rollins MRN: [id number] Date of Birth: 16-Dec-1936  SLP Diagnosis: Cognitive Impairments;Dysphagia;Speech and Language deficits  Rehab Potential: Good ELOS: 12-14 days   Today's Date: 04/07/2021 SLP Individual Time: 1300-1400 SLP Individual Time Calculation (min): 60 min  Hospital Problem: Principal Problem:   Acute CVA (cerebrovascular accident) ([place]) Active Problems:   Stroke (cerebrum) ([place])  Past Medical History:  Past Medical History:  Diagnosis Date  . COVID-19 2019   Was hospitalized/ has had dizzines on and off since  . Dementia ([place])   . Dyslipidemia   . Hypertension    Past Surgical History:  Past Surgical History:  Procedure Laterality Date  . NASAL POLYP EXCISION     Surgeries X 5 for removal    Assessment / Plan / Recommendation Clinical Impression Maxwell Rollins is an 85 year old male with history of dyslipidemia, Covid 19 2021 with resultant dizziness/balance problems, 3 week history of weakness who was admitted on 04/02/2021 with B/l LE numbness with reports of back pain and inability to walk. He was noted to have some confusion with ?history of dementia. UDS negative. MRI/MRA brain done revealing punctate infarct in mesial left temporal lobe and moderately advanced age related cerebral atrophy and mild chronic small vessel disease. 2D echocardiogram with EF of 60-65%, no wall abnormalities. Stroke felt to be due to small vessel disease. Dr Erlinda Hong recommended DAPT X 3 weeks followed by ASA alone and that symptoms due to lumbar stenosis.   MRI lumbar spine multifactorial degenerative changes in L4-5 with mild spinal stenosis and moderate R>L L4 foraminal stenosis and L5 on S1 anterolisthesis with severe right >left facet arthrosis and moderate right L5 foraminal narrowing and large multi lobulated aneurysm. 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 Dr. Donnetta Hutching consulted for input and will follow along. Patient will need consideration for elected repair and would need eventual referral to [facility] (Dr. Sammuel Hines) for off label graft Tx or potential open repair. He continues to have problems with mobility and therapy is working on pregait activity. Patient transferred to CIR on 04/06/2021 . Pt scored a 9/30 on the SLUMS in Acute on 04/04/21.  Pt presents with moderate cognitive impairment, most notably during Cognistat testing in immediate and delayed recall, reasoning, calculations and judgement. Pt presents with mild impairment in repetition, orientation, attention and comprehension. With extra time, pt oriented to age, birthday, MOY, year, city, state and hospital. Pt unable to accurately state where he lives, why he is in the hospital, DOW, or date. Pt does have mild delayed response time during simple conversation, benefits from repetition of prompts. Pt able to follow 2-3 step directions, name objects/pictures and complete simple sequences. Pt reports high level function at home including driving and med management, will likely need assistance with these task 2' cognitive deficits.   Pt presents with mild oropharyngeal dysphagia and min dysarthria. MD concerned for wet vocal quality, pt noted to have this with and without presence of food. Pt voice characterized as hoarse, wet and weak, throat clear and breath support was helpful in increasing intelligibility. Pt seen with regular and puree consistencies as well as thin liquids via cup and straw. Pt with no overt s/s aspiration with any trials, inconsistent wet vocal quality present throughout. Pt was noted to swallow 2-3 times per bolus, possible indication of  pharyngeal weakness/reside. Spoke with wife on the phone who denies any hx of dysphagia and reports pt vocal quality at baseline is wet,  hoarse and weak due to significant smoking history. Will monitor pt swallow function to determine if PO is cause of intermittent wet vocal quality.  Patient will require skilled SLP intervention while in CIR to maximize cognitive abilities, voice and swallow function prior to discharge.   Skilled Therapeutic Interventions          Pt participating in Clinical Swallow Evaluation and speech/language/cognitive assessment. Please see above.   SLP Assessment  Patient will need skilled Speech Lanaguage Pathology Services during CIR admission    Recommendations  Medication Administration: Whole meds with liquid Supervision: Patient able to self feed;Intermittent supervision to cue for compensatory strategies Compensations: Minimize environmental distractions;Slow rate;Small sips/bites;Follow solids with liquid;Clear throat intermittently;Effortful swallow Postural Changes and/or Swallow Maneuvers: Seated upright 90 degrees;Upright 30-60 min after meal Oral Care Recommendations: Oral care BID Recommendations for Other Services: Neuropsych consult Patient destination: Home Follow up Recommendations: Home Health SLP Equipment Recommended: To be determined    SLP Frequency 3 to 5 out of 7 days   SLP Duration  SLP Intensity  SLP Treatment/Interventions 12-14 days  Minumum of 1-2 x/day, 30 to 90 minutes  Cognitive remediation/compensation;Cueing hierarchy;Functional tasks;Therapeutic Activities;Therapeutic Exercise;Dysphagia/aspiration precaution training;Medication managment;Patient/family education    Pain Pain Assessment Pain Scale: 0-10 Pain Score: 0-No pain  Prior Functioning Cognitive/Linguistic Baseline: Baseline deficits Baseline deficit details: Word-finding difficulty Type of Home: House  Lives With: Spouse Available Help at Discharge: Family;Available 24 hours/day Vocation: Retired  Programmer, systems Overall Cognitive Status: Impaired/Different from  baseline Arousal/Alertness: Awake/alert Orientation Level: (P) Oriented to person;Oriented to place;Oriented to situation Attention: Selective;Sustained Sustained Attention: Impaired Sustained Attention Impairment: Verbal basic Selective Attention: Impaired Selective Attention Impairment: Verbal basic Memory: Impaired Memory Impairment: Retrieval deficit;Decreased recall of new information;Decreased short term memory Decreased Short Term Memory: Verbal basic Immediate Memory Recall: Sock;Blue;Bed Memory Recall Sock: Not able to recall Memory Recall Blue: Not able to recall Memory Recall Bed: Not able to recall Awareness: Impaired Awareness Impairment: Emergent impairment;Intellectual impairment Problem Solving: Impaired Problem Solving Impairment: Verbal basic Safety/Judgment: Impaired  Comprehension Auditory Comprehension Overall Auditory Comprehension: Impaired Commands: Impaired One Step Basic Commands: 50-74% accurate Complex Commands: 25-49% accurate Conversation: Simple Interfering Components: Hearing;Working Marine scientist;Attention EffectiveTechniques: Increased volume;Repetition;Slowed speech Expression Expression Primary Mode of Expression: Verbal Verbal Expression Overall Verbal Expression: Impaired Repetition: Impaired Level of Impairment: Phrase level;Word level Naming: Impairment Confrontation: Within functional limits Written Expression Dominant Hand: Right Oral Motor Oral Motor/Sensory Function Overall Oral Motor/Sensory Function: Within functional limits Motor Speech Overall Motor Speech: Appears within functional limits for tasks assessed  Care Tool Care Tool Cognition Expression of Ideas and Wants Expression of Ideas and Wants: Frequent difficulty - frequently exhibits difficulty with expressing needs and ideas   Understanding Verbal and Non-Verbal Content Understanding Verbal and Non-Verbal Content: Sometimes understands - understands only basic  conversations or simple, direct phrases. Frequently requires cues to understand   Memory/Recall Ability *first 3 days only Memory/Recall Ability *first 3 days only: That he or she is in a hospital/hospital unit    Bedside Swallowing Assessment General Diet Prior to this Study: Regular;Thin liquids Temperature Spikes Noted: No Respiratory Status: Room air History of Recent Intubation: No Behavior/Cognition: Alert;Cooperative Oral Cavity - Dentition: Adequate natural dentition Self-Feeding Abilities: Able to feed self Patient Positioning: Upright in chair/Tumbleform Baseline Vocal Quality: Wet;Hoarse;Low vocal intensity Volitional Cough: Weak;Wet Volitional Swallow: Able to elicit  Oral Care Assessment Does patient have any of the following "high(er) risk" factors?: None of the above Thin Liquid Thin Liquid: Within functional limits Presentation: Cup;Straw Puree Puree: Within functional limits Solid Solid: Impaired Presentation: Self Fed Oral Phase Functional Implications: Prolonged oral transit BSE Assessment Suspected Esophageal Findings Suspected Esophageal Findings: Belching Risk for Aspiration Impact on safety and function: Mild aspiration risk Other Related Risk Factors: Cognitive impairment  Short Term Goals: Week 1: SLP Short Term Goal 1 (Week 1): Patient will demonstrate intellectual awareness of current deficits in cognition, vocal intensity and vocal quality with modA. SLP Short Term Goal 2 (Week 1): Patient will tolerate current diet regular/thin liquids with min A cues for use of trained strategies (intermittent throat clear, alternate solids and liquids, small bites/sips). SLP Short Term Goal 3 (Week 1): Patient will demonstrate functional problem solving for basic and familair tasks with Mod verbal cues. SLP Short Term Goal 4 (Week 1): Patient will utilize external memory aids for orientation to place, date and situation with Min verbal and visual cues. SLP Short  Term Goal 5 (Week 1): Pt will utilize compensatory strategies as trained to increase vocal intensity and monitor vocal quality provided mod A verbal cues  Refer to Care Plan for Long Term Goals  Recommendations for other services: Neuropsych  Discharge Criteria: Patient will be discharged from SLP if patient refuses treatment 3 consecutive times without medical reason, if treatment goals not met, if there is a change in medical status, if patient makes no progress towards goals or if patient is discharged from hospital.  The above assessment, treatment plan, treatment alternatives and goals were discussed and mutually agreed upon: by patient  Dewaine Conger 04/07/2021, 1:49 PM

## 2021-04-07 NOTE — Progress Notes (Signed)
Patient had positive orthostatic hypotension during therapy per therapy staff. Repeat while sitting 99/71 asymptomatic at this time. Will continue to monitor.

## 2021-04-07 NOTE — Evaluation (Signed)
Occupational Therapy Assessment and Plan  Patient Details  Name: Maxwell Rollins MRN: [id number] Date of Birth: 25-Nov-1936  OT Diagnosis: abnormal posture, apraxia, cognitive deficits and muscle weakness (generalized) Rehab Potential: Rehab Potential (ACUTE ONLY): Fair ELOS: 14-16 days   Today's Date: 04/07/2021 OT Individual Time: 1000-1100 OT Individual Time Calculation (min): 60 min     Hospital Problem: Principal Problem:   Acute CVA (cerebrovascular accident) ([place]) Active Problems:   Stroke (cerebrum) ([place])   Past Medical History:  Past Medical History:  Diagnosis Date  . COVID-19 2019   Was hospitalized/ has had dizzines on and off since  . Dementia ([place])   . Dyslipidemia   . Hypertension    Past Surgical History:  Past Surgical History:  Procedure Laterality Date  . NASAL POLYP EXCISION     Surgeries X 5 for removal    Assessment & Plan Clinical Impression: .Maxwell Rollins is an 85 year old male with history of dyslipidemia, Covid 19 2021 with resultant dizziness/balance problems, 3 week history of weakness who was admitted on 04/02/2021 with B/l LE numbness with reports of  back pain and inability to walk. He was noted to have some confusion with ?history of dementia. UDS negative. MRI/MRA brain done revealing punctate infarct in mesial left temporal lobe and moderately advanced age related cerebral atrophy and mild chronic small vessel disease. 2D echocardiogram with EF of 60-65%, no wall abnormalities. Stroke felt to be due to small vessel disease. Dr Erlinda Hong recommended DAPT X 3 weeks followed by ASA alone and that symptoms due to lumbar stenosis.    MRI lumbar spine multifactorial degenerative changes in L4-5 with mild spinal stenosis and moderate R>L L4 foraminal stenosis and L5 on S1 anterolisthesis with severe right >left facet arthrosis and moderate right L5 foraminal narrowing and large multi lobulated aneurysm.  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 Dr. Donnetta Hutching consulted for input and will follow along. Patient will need consideration for elected repair and would need eventual referral to [facility] (Dr. Sammuel Hines) for off label graft Tx or potential open repair. He continues to have problems with mobility and therapy is working on pregait activity. Please see preadmission assessment from earlier today as well. .    Patient transferred to CIR on 04/06/2021 .    Patient currently requires max to  total with basic self-care skills secondary to muscle weakness and muscle joint tightness, decreased cardiorespiratoy endurance, impaired timing and sequencing and decreased motor planning, decrease ant/post midline awareness, decreased awareness, decreased problem solving, decreased memory and delayed processing and decreased sitting balance, decreased standing balance, decreased postural control and decreased balance strategies.  Prior to hospitalization, patient was fully independent and active.  Patient will benefit from skilled intervention to increase independence with basic self-care skills prior to discharge home with care partner.  Anticipate patient will require minimal physical assistance and follow up home health.  OT - End of Session Activity Tolerance: Tolerates 10 - 20 min activity with multiple rests Endurance Deficit: Yes OT Assessment Rehab Potential (ACUTE ONLY): Fair OT Patient demonstrates impairments in the following area(s): Balance;Motor;Perception;Safety;Endurance;Cognition OT Basic ADL's Functional Problem(s): Bathing;Dressing;Toileting OT Transfers Functional Problem(s): Toilet;Tub/Shower OT Additional Impairment(s): None OT Plan OT Intensity: Minimum of 1-2 x/day, 45 to 90 minutes OT Frequency: 5 out of 7 days OT Duration/Estimated Length of Stay: 14-16 days OT Treatment/Interventions:  Balance/vestibular training;Discharge planning;Cognitive remediation/compensation;Functional  mobility training;DME/adaptive equipment instruction;Neuromuscular re-education;Patient/family education;Psychosocial support;Therapeutic Activities;Self Care/advanced ADL retraining;Therapeutic Exercise;UE/LE Strength taining/ROM;UE/LE Coordination activities OT Self Feeding Anticipated Outcome(s): no goal, pt is already set up OT Basic Self-Care Anticipated Outcome(s): min A overall OT Toileting Anticipated Outcome(s): min A overall OT Bathroom Transfers Anticipated Outcome(s): min A OT Recommendation Patient destination: Home Follow Up Recommendations: Home health OT Equipment Recommended: None recommended by OT   OT Evaluation Precautions/Restrictions  Precautions Precautions: Fall Restrictions Weight Bearing Restrictions: No  Pain Pain Assessment Pain Scale: 0-10 Pain Score: 0-No pain Home Living/Prior Functioning Home Living Available Help at Discharge: Family,Available 24 hours/day Type of Home: House Home Access: Level entry Entrance Stairs-Number of Steps: 0 Home Layout: Two level,Able to live on main level with bedroom/bathroom,1/2 bath on main level Alternate Level Stairs-Number of Steps: pt unable to answer questions about home set up, this info is from previous documentation, PMR note says level entry, 1 level home Bathroom Shower/Tub: Multimedia programmer: Handicapped height  Lives With: Spouse Prior Function Level of Independence: Independent with basic ADLs,Independent with transfers,Independent with gait  Able to Take Stairs?: Yes Driving: No Vocation: Retired Surveyor, mining Baseline Vision/History: Wears glasses Wears Glasses: Reading only Patient Visual Report: No change from baseline Vision Assessment?: No apparent visual deficits Additional Comments: pt able to track stimulus Perception  Perception:  (difficult to fully assess due to cognition) Spatial  Orientation: impaired for midline and lateral balance awareness Praxis Praxis: Impaired Praxis Impairment Details: Motor planning Cognition Overall Cognitive Status: Impaired/Different from baseline Arousal/Alertness: Awake/alert Orientation Level: Person;Place Year: 2022 Month: May Day of Week: Correct Memory: Impaired Memory Impairment: Retrieval deficit;Decreased recall of new information;Decreased short term memory Decreased Short Term Memory: Verbal basic Immediate Memory Recall: Sock;Blue;Bed Memory Recall Sock: Not able to recall Memory Recall Blue: Not able to recall Memory Recall Bed: Not able to recall Attention: Selective;Sustained Sustained Attention: Appears intact Sustained Attention Impairment: Verbal basic Selective Attention: Impaired Selective Attention Impairment: Verbal basic Awareness: Impaired Awareness Impairment: Emergent impairment;Intellectual impairment Problem Solving: Impaired Problem Solving Impairment: Verbal basic Safety/Judgment: Impaired Sensation Sensation Light Touch: Appears Intact Hot/Cold: Appears Intact Proprioception: Impaired by gross assessment Stereognosis: Not tested Coordination Gross Motor Movements are Fluid and Coordinated: No Fine Motor Movements are Fluid and Coordinated: No Coordination and Movement Description: pt has somewhat rigid movements with a heavy posterior lean Finger Nose Finger Test: unable to complete test Motor  Motor Motor: Motor apraxia;Other (comment) Motor - Skilled Clinical Observations: rigidity with muscle movement  Trunk/Postural Assessment  Thoracic Assessment Thoracic Assessment: Exceptions to WFL (kyphotic posture) Postural Control Postural Control: Deficits on evaluation Trunk Control: very weak Righting Reactions: posterior lean in sit and standing Protective Responses: absent  Balance Static Sitting Balance Static Sitting - Level of Assistance: 3: Mod assist (posterior lean) Static  Standing Balance Static Standing - Level of Assistance: 2: Max assist (posterior lean) Extremity/Trunk Assessment RUE Assessment Active Range of Motion (AROM) Comments: WFL General Strength Comments: shoulder 4-, elbow 4+, grasp 3+/5 LUE Assessment Active Range of Motion (AROM) Comments: sh flexion 140 General Strength Comments: shoulder 3+, elbow 4+, grasp 3+/5  Care Tool Care Tool Self Care Eating  set up      Oral Care    Oral Care Assist Level: Supervision/Verbal cueing    Bathing   Body parts bathed by patient: Chest;Abdomen;Face;Left arm;Right arm Body parts bathed by helper: Front perineal area;Buttocks;Right upper leg;Left upper leg;Right lower leg;Left lower leg   Assist Level: Moderate Assistance - Patient 50 - 74%  Upper Body Dressing(including orthotics)   What is the patient wearing?: Pull over shirt   Assist Level: Maximal Assistance - Patient 25 - 49%    Lower Body Dressing (excluding footwear)   What is the patient wearing?: Incontinence brief;Pants Assist for lower body dressing: Total Assistance - Patient < 25%    Putting on/Taking off footwear   What is the patient wearing?: Non-skid slipper socks Assist for footwear: Total Assistance - Patient < 25%       Care Tool Toileting Toileting activity   Assist for toileting: Dependent - Patient 0%     Care Tool Bed Mobility Roll left and right activity   Roll left and right assist level: Moderate Assistance - Patient 50 - 74%    Sit to lying activity   Sit to lying assist level: Maximal Assistance - Patient 25 - 49%    Lying to sitting edge of bed activity   Lying to sitting edge of bed assist level: Maximal Assistance - Patient 25 - 49%     Care Tool Transfers Sit to stand transfer   Sit to stand assist level: 2 Helpers    Chair/bed transfer   Chair/bed transfer assist level: 2 Pension scheme manager transfer   Assist Level: 2 Helpers     Care Tool Cognition Expression of Ideas and Wants  Expression of Ideas and Wants: Frequent difficulty - frequently exhibits difficulty with expressing needs and ideas   Understanding Verbal and Non-Verbal Content Understanding Verbal and Non-Verbal Content: Sometimes understands - understands only basic conversations or simple, direct phrases. Frequently requires cues to understand   Memory/Recall Ability *first 3 days only Memory/Recall Ability *first 3 days only: That he or she is in a hospital/hospital unit    Refer to Care Plan for [place] 1 OT Short Term Goal 1 (Week 1): Pt will be able to rise to stand with mod A of 1 to prep for LB self care. OT Short Term Goal 2 (Week 1): Pt will be able to stand with mod A of 1 while pulling pants over hips with LB dressing. OT Short Term Goal 3 (Week 1): Pt will be able to transfer on and off the toilet with mod A of 1.  Recommendations for other services: None    Skilled Therapeutic Intervention ADL ADL Eating: Set up Grooming: Supervision/safety Upper Body Bathing: Supervision/safety;Moderate cueing Where Assessed-Upper Body Bathing: Sitting at sink Lower Body Bathing: Maximal assistance Where Assessed-Lower Body Bathing: Sitting at sink;Standing at sink Upper Body Dressing: Maximal assistance Where Assessed-Upper Body Dressing: Wheelchair Lower Body Dressing: Dependent Where Assessed-Lower Body Dressing: Wheelchair Toileting: Dependent Where Assessed-Toileting: Bedside Commode Toilet Transfer: Other (comment) (stedy)   Pt seen for initial evaluation and ADL training with a main focus on postural control. Pt awake and able to answer some basic questions but also demonstrated quite a bit of confusion about where he was (as far as state) or what exactly brought him to the hospital. He had difficulty describing what was different about him since his stroke.  Pt's movement patterns were rigid and he displayed weakness in all extremities, requiring max to sit up  on the EOB.  Once sitting, he had a significant posterior lean with no protective response to falling back. Needed +2 A to safely have pt rise to stand with max A and then max to maintain balance as he continued to have a posterior lean.  Attempted stepping with RW to  wc but pt unable to initiate steps so used a squat pivot of 2 A with max.  Pt will need the stedy lift for safe toilet transfers.  Worked on B/d at the sink with max A overall. Pt very pleasant and cooperative. Explained role of OT and discussed his goals.   Pt resting in wc with belt alarm on and all needs met.    Discharge Criteria: Patient will be discharged from OT if patient refuses treatment 3 consecutive times without medical reason, if treatment goals not met, if there is a change in medical status, if patient makes no progress towards goals or if patient is discharged from hospital.  The above assessment, treatment plan, treatment alternatives and goals were discussed and mutually agreed upon: by patient  [facility] 04/07/2021, 12:45 PM

## 2021-04-07 NOTE — Progress Notes (Signed)
Telesitter order renewed, however still awaiting a spot to open for monitoring. Patient resting in bed with call bell within reach.

## 2021-04-07 NOTE — Progress Notes (Signed)
PROGRESS NOTE   Subjective/Complaints:  No c/os, , ate breakfast , slowed responses  ROS- neg CP, SOB, N/V/D  Objective:   No results found. Recent Labs    04/04/21 0839  WBC 7.9  HGB 13.8  HCT 41.4  PLT 141*   Recent Labs    04/04/21 0839 04/05/21 0400  NA 141 137  K 3.9 3.8  CL 106 105  CO2 28 29  GLUCOSE 97 113*  BUN 17 18  CREATININE 1.29* 1.36*  CALCIUM 8.7* 8.4*    Intake/Output Summary (Last 24 hours) at 04/07/2021 0721 Last data filed at 04/06/2021 1820 Gross per 24 hour  Intake 200 ml  Output --  Net 200 ml        Physical Exam: Vital Signs Blood pressure (!) 177/97, pulse 66, temperature 98.1 F (36.7 C), temperature source Oral, resp. rate 20, SpO2 98 %.  General: No acute distress Mood and affect are appropriate Heart: Regular rate and rhythm no rubs murmurs or extra sounds Lungs: Clear to auscultation, breathing unlabored, no rales or wheezes Abdomen: Positive bowel sounds, soft nontender to palpation, nondistended Extremities: No clubbing, cyanosis, or edema Skin: No evidence of breakdown, no evidence of rash Neurologic: Cranial nerves II through XII intact, motor strength is 5/5 in bilateral deltoid, bicep, tricep, grip, hip flexor, knee extensors, ankle dorsiflexor and plantar flexor Oriented to person , hosp and GBO not time  Musculoskeletal: Full range of motion in all 4 extremities. No joint swelling  Wet voice   Assessment/Plan: 1. Functional deficits which require 3+ hours per day of interdisciplinary therapy in a comprehensive inpatient rehab setting.  Physiatrist is providing close team supervision and 24 hour management of active medical problems listed below.  Physiatrist and rehab team continue to assess barriers to discharge/monitor patient progress toward functional and medical goals  Care Tool:  Bathing              Bathing assist       Upper Body  Dressing/Undressing Upper body dressing   What is the patient wearing?: Hospital gown only    Upper body assist Assist Level: Maximal Assistance - Patient 25 - 49%    Lower Body Dressing/Undressing Lower body dressing      What is the patient wearing?: Incontinence brief     Lower body assist Assist for lower body dressing: Total Assistance - Patient < 25%     Toileting Toileting    Toileting assist Assist for toileting: Dependent - Patient 0%     Transfers Chair/bed transfer  Transfers assist           Locomotion Ambulation   Ambulation assist              Walk 10 feet activity   Assist           Walk 50 feet activity   Assist           Walk 150 feet activity   Assist           Walk 10 feet on uneven surface  activity   Assist           Wheelchair  Assist               Wheelchair 50 feet with 2 turns activity    Assist            Wheelchair 150 feet activity     Assist          Blood pressure (!) 177/97, pulse 66, temperature 98.1 F (36.7 C), temperature source Oral, resp. rate 20, SpO2 98 %.     Medical Problem List and Plan: 1.  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             -patient may shower             -ELOS/Goals: 12-16 days/Supervision             Admit to CIR 2.  Antithrombotics: -DVT/anticoagulation:  Pharmaceutical: Lovenox             -antiplatelet therapy: DAPT X 3 weeks followed by ASA alone.  3. Lumbar stenosis/Pain Management: Denies back pain. N/A 4. Mood: LCSW to follow for evaluation and support.              -antipsychotic agents: N/A 5. Neuropsych: This patient is ?not fully capable of making decisions on his own behalf. 6. Skin/Wound Care: Routine pressure relief measures 7. Fluids/Electrolytes/Nutrition: Monitor I/Os             CMP ordered for Monday 8.  Dyslipidemia              High dose statin. 9. HTN: Monitor BP TID             Norvasc increase to 10mg              Vitals:   04/06/21 2003 04/07/21 0511  BP: (!) 157/91 (!) 177/97  Pulse: 72 66  Resp: 16 20  Temp: 98.6 F (37 C) 98.1 F (36.7 C)  SpO2: 97% 98%   10. Thrombocytopenia             Plts 141 on 4/6, labs ordered for Monday 11.  Wet voice after meal SLP eval pending  LOS: 1 days A FACE TO FACE EVALUATION WAS PERFORMED  Friday 04/07/2021, 7:21 AM

## 2021-04-08 NOTE — IPOC Note (Signed)
Overall Plan of Care [facility]) Patient Details Name: Maxwell Rollins MRN: [id number] DOB: Nov 21, 1936  Admitting Diagnosis: Acute CVA (cerebrovascular accident) [facility])  Hospital Problems: Principal Problem:   Acute CVA (cerebrovascular accident) [facility]) Active Problems:   Stroke (cerebrum) ([facility])     Functional Problem List: Nursing Skin Integrity  PT Safety,Motor,Sensory,Pain,Skin Integrity,Balance,Endurance  OT Balance,Motor,Perception,Safety,Endurance,Cognition  SLP Cognition,Safety  TR         Basic ADL's: OT Bathing,Dressing,Toileting     Advanced  ADL's: OT       Transfers: PT Bed to Chair,Bed Mobility,Car,Furniture  OT Toilet,Tub/Shower     Locomotion: PT Ambulation,Stairs     Additional Impairments: OT None  SLP Swallowing      TR      Anticipated Outcomes Item Anticipated Outcome  Self Feeding no goal, pt is already set up  Swallowing  Supervision   Basic self-care  min A overall  Toileting  min A overall   Bathroom Transfers min A  Bowel/Bladder  Manage bowel and bladder with mod  I assist  Transfers  CGA using LRAD  Locomotion  CGA using LRAD  Communication     Cognition  Min A for basic cognition  Pain  n/a  Safety/Judgment  Maintain safety with cues/reminders   Therapy Plan: PT Intensity: Minimum of 1-2 x/day ,45 to 90 minutes PT Frequency: 5 out of 7 days PT Duration Estimated Length of Stay: ~2.5 weeks OT Intensity: Minimum of 1-2 x/day, 45 to 90 minutes OT Frequency: 5 out of 7 days OT Duration/Estimated Length of Stay: 14-16 days SLP Intensity: Minumum of 1-2 x/day, 30 to 90 minutes SLP Frequency: 3 to 5 out of 7 days SLP Duration/Estimated Length of Stay: 12-14 days   Due to the current state of emergency, patients may not be receiving their 3-hours of Medicare-mandated therapy.   Team Interventions: Nursing Interventions Patient/Family Education,Bladder Management,Bowel Management,Disease  Management/Prevention,Medication Management,Discharge Planning,Cognitive Remediation/Compensation  PT interventions Ambulation/gait training,Cognitive remediation/compensation,Discharge planning,DME/adaptive equipment instruction,Functional mobility training,Pain management,Psychosocial support,Splinting/orthotics,Therapeutic Activities,UE/LE Strength taining/ROM,Visual/perceptual remediation/compensation,Balance/vestibular training,Community reintegration,Disease management/prevention,Functional electrical stimulation,Neuromuscular re-education,Patient/family education,Skin care/wound management,Stair training,Therapeutic Exercise,UE/LE Psychiatrist propulsion/positioning  OT Interventions Balance/vestibular training,Discharge planning,Cognitive remediation/compensation,Functional mobility training,DME/adaptive equipment instruction,Neuromuscular re-education,Patient/family education,Psychosocial support,Therapeutic Activities,Self Care/advanced ADL retraining,Therapeutic Exercise,UE/LE Strength taining/ROM,UE/LE Coordination activities  SLP Interventions Cognitive remediation/compensation,Cueing hierarchy,Functional tasks,Therapeutic Activities,Therapeutic Exercise,Dysphagia/aspiration precaution training,Medication managment,Patient/family education  TR Interventions    SW/CM Interventions     Barriers to Discharge MD  Medical stability  Nursing Decreased caregiver support,Home environment access/layout,Behavior,Incontinence 2level townhome, 1/2 bath and bed on main/ 8step-6stepto2nd level bath and bedroom  PT Incontinence,Home environment access/layout,Other (comments) Hx of dementia  OT      SLP      SW       Team Discharge Planning: Destination: PT-Home ,OT- Home , SLP-Home Projected Follow-up: PT-Outpatient PT,24 hour supervision/assistance, OT-  Home health OT, SLP-Home Health SLP Projected Equipment Needs: PT-To be determined, OT- None recommended by OT, SLP-To  be determined Equipment Details: PT- , OT-  Patient/family involved in discharge planning: PT- Patient,  OT-Patient, SLP-Patient,Family member/caregiver  MD ELOS: 10-12d Medical Rehab Prognosis:  Good Assessment:  85 year old male with history of dyslipidemia, Covid 19 2021 with resultant dizziness/balance problems, 3 week history of weakness who was admitted on 04/02/2021 with B/l LE numbness with reports of  back pain and inability to walk. He was noted to have some confusion with ?history of dementia. UDS negative. MRI/MRA brain done revealing punctate infarct in mesial left temporal lobe and moderately advanced age related cerebral atrophy and mild  chronic small vessel disease. 2D echocardiogram with EF of 60-65%, no wall abnormalities. Stroke felt to be due to small vessel disease. Dr Roda Shutters recommended DAPT X 3 weeks followed by ASA alone and that symptoms due to lumbar stenosis.   MRI lumbar spine multifactorial degenerative changes in L4-5 with mild spinal stenosis and moderate R>L L4 foraminal stenosis and L5 on S1 anterolisthesis with severe right >left facet arthrosis and moderate right L5 foraminal narrowing and large multi lobulated aneurysm.  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 Dr. Arbie Cookey consulted for input and will follow along. Patient will need consideration for elected repair and would need eventual referral to [facility] (Dr. Pattricia Boss) for off label graft Tx or potential open repair. He continues to have problems with mobility and therapy is working on pregait activity. Please see preadmission assessment from earlier today as well. .     See Team Conference Notes for weekly updates to the plan of care

## 2021-04-08 NOTE — Progress Notes (Addendum)
PROGRESS NOTE   Subjective/Complaints: Has telesitter for safety    ROS- neg CP, SOB, N/V/D  Objective:   No results found. No results for input(s): WBC, HGB, HCT, PLT in the last 72 hours. No results for input(s): NA, K, CL, CO2, GLUCOSE, BUN, CREATININE, CALCIUM in the last 72 hours.  Intake/Output Summary (Last 24 hours) at 04/08/2021 0627 Last data filed at 04/07/2021 1900 Gross per 24 hour  Intake 476 ml  Output --  Net 476 ml        Physical Exam: Vital Signs Blood pressure 134/86, pulse 66, temperature 98.4 F (36.9 C), temperature source Oral, resp. rate 20, SpO2 95 %.  General: No acute distress Mood and affect are appropriate Heart: Regular rate and rhythm no rubs murmurs or extra sounds Lungs: Clear to auscultation, breathing unlabored, no rales or wheezes Abdomen: Positive bowel sounds, soft nontender to palpation, nondistended Extremities: No clubbing, cyanosis, or edema Skin: No evidence of breakdown, no evidence of rash   Neurologic: Cranial nerves II through XII intact, motor strength is 5/5 in bilateral deltoid, bicep, tricep, grip, hip flexor, knee extensors, ankle dorsiflexor and plantar flexor Oriented to person , hosp and GBO not time  Musculoskeletal: Full range of motion in all 4 extremities. No joint swelling  Wet voice   Assessment/Plan: 1. Functional deficits which require 3+ hours per day of interdisciplinary therapy in a comprehensive inpatient rehab setting.  Physiatrist is providing close team supervision and 24 hour management of active medical problems listed below.  Physiatrist and rehab team continue to assess barriers to discharge/monitor patient progress toward functional and medical goals  Care Tool:  Bathing    Body parts bathed by patient: Chest,Abdomen,Face,Left arm,Right arm   Body parts bathed by helper: Front perineal area,Buttocks,Right upper leg,Left upper  leg,Right lower leg,Left lower leg     Bathing assist Assist Level: Moderate Assistance - Patient 50 - 74%     Upper Body Dressing/Undressing Upper body dressing   What is the patient wearing?: Pull over shirt    Upper body assist Assist Level: Maximal Assistance - Patient 25 - 49%    Lower Body Dressing/Undressing Lower body dressing      What is the patient wearing?: Incontinence brief     Lower body assist Assist for lower body dressing: Total Assistance - Patient < 25%     Toileting Toileting    Toileting assist Assist for toileting: Dependent - Patient 0%     Transfers Chair/bed transfer  Transfers assist     Chair/bed transfer assist level: Moderate Assistance - Patient 50 - 74%     Locomotion Ambulation   Ambulation assist   Ambulation activity did not occur: Safety/medical concerns          Walk 10 feet activity   Assist  Walk 10 feet activity did not occur: Safety/medical concerns        Walk 50 feet activity   Assist Walk 50 feet with 2 turns activity did not occur: Safety/medical concerns         Walk 150 feet activity   Assist Walk 150 feet activity did not occur: Safety/medical concerns  Walk 10 feet on uneven surface  activity   Assist Walk 10 feet on uneven surfaces activity did not occur: Safety/medical concerns         Wheelchair     Assist Will patient use wheelchair at discharge?:  (TBD)             Wheelchair 50 feet with 2 turns activity    Assist            Wheelchair 150 feet activity     Assist          Blood pressure 134/86, pulse 66, temperature 98.4 F (36.9 C), temperature source Oral, resp. rate 20, SpO2 95 %.     Medical Problem List and Plan: 1.  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             -patient may shower             -ELOS/Goals: 12-16 days/Supervision  initially but is doing well and will likely d/c sooner             CIR PT, OT 2.  Antithrombotics: -DVT/anticoagulation:  Pharmaceutical: Lovenox             -antiplatelet therapy: DAPT X 3 weeks followed by ASA alone.  3. Lumbar stenosis/Pain Management: Denies back pain. N/A 4. Mood: LCSW to follow for evaluation and support.              -antipsychotic agents: N/A 5. Neuropsych: This patient is ?not fully capable of making decisions on his own behalf. 6. Skin/Wound Care: Routine pressure relief measures 7. Fluids/Electrolytes/Nutrition: Monitor I/Os             CMP ordered for Monday 8. Dyslipidemia              High dose statin. 9. HTN: Monitor BP TID             Norvasc increase to 10mg              Vitals:   04/07/21 1936 04/08/21 0349  BP: 111/74 134/86  Pulse: 70 66  Resp: 17 20  Temp: 98.3 F (36.8 C) 98.4 F (36.9 C)  SpO2: 96% 95%  controlled 4/10 10. Thrombocytopenia             Plts 141 on 4/6, labs ordered for Monday 11.  Wet voice -appreciate SLP eval LOS: 2 days A FACE TO FACE EVALUATION WAS PERFORMED  Tuesday 04/08/2021, 6:27 AM

## 2021-04-09 DIAGNOSIS — N179 Acute kidney failure, unspecified: Secondary | ICD-10-CM

## 2021-04-09 DIAGNOSIS — I10 Essential (primary) hypertension: Secondary | ICD-10-CM

## 2021-04-09 LAB — COMPREHENSIVE METABOLIC PANEL
ALT: 21 U/L (ref 0–44)
AST: 21 U/L (ref 15–41)
Albumin: 2.9 g/dL — ABNORMAL LOW (ref 3.5–5.0)
Alkaline Phosphatase: 101 U/L (ref 38–126)
Anion gap: 3 — ABNORMAL LOW (ref 5–15)
BUN: 27 mg/dL — ABNORMAL HIGH (ref 8–23)
CO2: 29 mmol/L (ref 22–32)
Calcium: 8.6 mg/dL — ABNORMAL LOW (ref 8.9–10.3)
Chloride: 104 mmol/L (ref 98–111)
Creatinine, Ser: 1.49 mg/dL — ABNORMAL HIGH (ref 0.61–1.24)
GFR, Estimated: 47 mL/min — ABNORMAL LOW (ref >60)
Glucose, Bld: 98 mg/dL (ref 70–99)
Potassium: 4.2 mmol/L (ref 3.5–5.1)
Sodium: 136 mmol/L (ref 135–145)
Total Bilirubin: 1 mg/dL (ref 0.3–1.2)
Total Protein: 5.6 g/dL — ABNORMAL LOW (ref 6.5–8.1)

## 2021-04-09 LAB — CBC WITH DIFFERENTIAL/PLATELET
Abs Immature Granulocytes: 0.04 10*3/uL (ref 0.00–0.07)
Basophils Absolute: 0.1 10*3/uL (ref 0.0–0.1)
Basophils Relative: 1 %
Eosinophils Absolute: 0.8 10*3/uL — ABNORMAL HIGH (ref 0.0–0.5)
Eosinophils Relative: 8 %
HCT: 42.5 % (ref 39.0–52.0)
Hemoglobin: 14.2 g/dL (ref 13.0–17.0)
Immature Granulocytes: 0 %
Lymphocytes Relative: 22 %
Lymphs Abs: 2.1 10*3/uL (ref 0.7–4.0)
MCH: 29.6 pg (ref 26.0–34.0)
MCHC: 33.4 g/dL (ref 30.0–36.0)
MCV: 88.7 fL (ref 80.0–100.0)
Monocytes Absolute: 1 10*3/uL (ref 0.1–1.0)
Monocytes Relative: 10 %
Neutro Abs: 5.8 10*3/uL (ref 1.7–7.7)
Neutrophils Relative %: 59 %
Platelets: 155 10*3/uL (ref 150–400)
RBC: 4.79 MIL/uL (ref 4.22–5.81)
RDW: 15.2 % (ref 11.5–15.5)
WBC: 9.8 10*3/uL (ref 4.0–10.5)
nRBC: 0 % (ref 0.0–0.2)

## 2021-04-09 LAB — GLUCOSE, CAPILLARY: Glucose-Capillary: 122 mg/dL — ABNORMAL HIGH (ref 70–99)

## 2021-04-09 NOTE — Progress Notes (Signed)
Speech Language Pathology Daily Session Note  Patient Details  Name: Maxwell Rollins MRN: [id number] Date of Birth: 07-17-1936  Today's Date: 04/09/2021 SLP Individual Time: 1300-1355 SLP Individual Time Calculation (min): 55 min  Short Term Goals: Week 1: SLP Short Term Goal 1 (Week 1): Patient will demonstrate intellectual awareness of current deficits in cognition, vocal intensity and vocal quality with modA. SLP Short Term Goal 2 (Week 1): Patient will tolerate current diet regular/thin liquids with min A cues for use of trained strategies (intermittent throat clear, alternate solids and liquids, small bites/sips). SLP Short Term Goal 3 (Week 1): Patient will demonstrate functional problem solving for basic and familair tasks with Mod verbal cues. SLP Short Term Goal 4 (Week 1): Patient will utilize external memory aids for orientation to place, date and situation with Min verbal and visual cues. SLP Short Term Goal 5 (Week 1): Pt will utilize compensatory strategies as trained to increase vocal intensity and monitor vocal quality provided mod A verbal cues  Skilled Therapeutic Interventions: Skilled SLP intervention focused on dysphagia and cognition. Pt seen with regular snack and thin liquids. He demonstrated adequate mastication and oral clearance with regular snack. Coughing noted x1 before swallow with saltine cracker. Voice was clear durinhg trials with snack. Wet voice noted x1 at end of session without po trials. Max A to orient to today's date and medical events using calendar. Simple written time calculation problem solving task completed with  Mod A verbal and visual cues for calculations. Cont with therapy per plan of care. Pt left seated upright in chair with call bell and phone within reach. Chair alarm set.      Pain Pain Assessment Pain Scale: Faces Pain Score: 0-No pain Faces Pain Scale: No hurt  Therapy/Group: Individual Therapy  Maxwell Rollins 04/09/2021, 1:47  PM

## 2021-04-09 NOTE — Progress Notes (Addendum)
Inpatient Rehabilitation Care Coordinator Assessment and Plan Patient Details  Name: Maxwell Rollins MRN: [id number] Date of Birth: Apr 04, 1936  Today's Date: 04/09/2021  Hospital Problems: Principal Problem:   Acute CVA (cerebrovascular accident) [facility]) Active Problems:   Stroke (cerebrum) [facility])  Past Medical History:  Past Medical History:  Diagnosis Date  . COVID-19 2019   Was hospitalized/ has had dizzines on and off since  . Dementia (Maxwell Rollins)   . Dyslipidemia   . Hypertension    Past Surgical History:  Past Surgical History:  Procedure Laterality Date  . NASAL POLYP EXCISION     Surgeries X 5 for removal   Social History:  reports that he has never smoked. He has never used smokeless tobacco. He reports that he does not drink alcohol and does not use drugs.  Family / Support Systems Spouse/Significant Other: [name] Children: Maxwell Rollins Other Supports: Marcell Barlow Anticipated Caregiver: Spouse, Maxwell Rollins Ability/Limitations of Caregiver: Wife has memory issues Caregiver Availability: 24/7  Social History Preferred language: English Religion:  Read: Yes Write: Yes Employment Status: Retired Public relations account executive Issues: n/a Guardian/Conservator: n/a   Abuse/Neglect Abuse/Neglect Assessment Can Be Completed: Yes Physical Abuse: Denies Verbal Abuse: Denies Sexual Abuse: Denies Exploitation of patient/patient's resources: Denies Self-Neglect: Denies  Emotional Status Pt's affect, behavior and adjustment status: no Recent Psychosocial Issues: pt from [place] concerned of issues there Psychiatric History: no Substance Abuse History: no  Patient / Family Perceptions, Expectations & Goals Pt/Family understanding of illness & functional limitations: yes per daughter Premorbid pt/family roles/activities: Independent and driving Anticipated changes in roles/activities/participation: Assistance/Supervision Pt/family expectations/goals:  Warden/ranger Agencies: None Premorbid Home Care/DME Agencies: Other (Comment) (Crutches, [employer] [place], Civil engineer, contracting, Education officer, environmental) Transportation available at discharge: Family able to transport  Discharge Planning Living Arrangements: Spouse/significant other Support Systems: Spouse/significant other,Children Type of Residence: Private residence (2 level home, able to maintain on main level) Administrator, sports: Chartered certified accountant Resources: [place] Referred: No Living Expenses: Own Money Management: Patient,Spouse Does the patient have any problems obtaining your medications?: No Home Management: Previously Independent Patient/Family Preliminary Plans: Some assistance/supervision Care Coordinator Anticipated Follow Up Needs: HH/OP Expected length of stay: 13-16 Days  Clinical Impression sw met with pt in room, called dtr at bedside. Introduced self, explained role and addressed questions and concerns. Dtr concerned about HCPOA - sw confirmed appt. set for today 4/11 at 3:30 PM to complete. Dtr also concerned about a procedure pt has planned, sw will follow up with physician. Pt plans to d/c home with family to provide 24/7 care at supervision level.  Dtr will be present tomorrow, no additional questions or concerns, sw will continue to follow up.  [name] 04/09/2021, 12:59 PM

## 2021-04-09 NOTE — Progress Notes (Signed)
Occupational Therapy Session Note  Patient Details  Name: Maxwell Rollins MRN: [id number] Date of Birth: 1936/05/09  Today's Date: 04/09/2021 OT Individual Time: 0830-0900 OT Individual Time Calculation (min): 30 min    Short Term Goals: Week 1:  OT Short Term Goal 1 (Week 1): Pt will be able to rise to stand with mod A of 1 to prep for LB self care. OT Short Term Goal 2 (Week 1): Pt will be able to stand with mod A of 1 while pulling pants over hips with LB dressing. OT Short Term Goal 3 (Week 1): Pt will be able to transfer on and off the toilet with mod A of 1.  Skilled Therapeutic Interventions/Progress Updates:    Pt greeted semi-reclined in bed and agreeable to OT treatment session. OT donned TED hose over feet, then pt able to follow commands and reach with B UEs to help pull TEDs up to thighs. Pt then completed bed mobility with mod A. Pt with posterior lean in sittting requiring verbal cues and min A. Worked on anterior weight shift to reach forward and try to thread pant legs. Pt still needed OT assist to get pants over feet, but was able to reach forward. Sit<>stand in stedy with min A, but posterior lean in standing needing mod A to correct while pulling up pants. Stedy transfer over to recliner. Pt doffed soiled shirt with min A and verbal cues to remove R shirt sleeve. Pt donned clean shirt with set-up A and verbal cues to pull shirt down over stomach. Pt left semi-reclined in recliner with alarm belt on, call bell in reach, and needs met.   Therapy Documentation Precautions:  Precautions Precautions: Fall,Other (comment) Precaution Comments: Monitor BP. Posterior lean Restrictions Weight Bearing Restrictions: No Pain: Pain Assessment Pain Scale: 0-10 Pain Score: 0-No pain   Therapy/Group: Individual Therapy  Valma Cava 04/09/2021, 9:01 AM

## 2021-04-09 NOTE — Progress Notes (Signed)
Inpatient Rehabilitation Center Individual Statement of Services  Patient Name:  Maxwell Rollins  Date:  04/09/2021  Welcome to the Inpatient Rehabilitation Center.  Our goal is to provide you with an individualized program based on your diagnosis and situation, designed to meet your specific needs.  With this comprehensive rehabilitation program, you will be expected to participate in at least 3 hours of rehabilitation therapies Monday-Friday, with modified therapy programming on the weekends.  Your rehabilitation program will include the following services:  Physical Therapy (PT), Occupational Therapy (OT), Speech Therapy (ST), 24 hour per day rehabilitation nursing, Therapeutic Recreaction (TR), Neuropsychology, Care Coordinator, Rehabilitation Medicine, Nutrition Services, Pharmacy Services and Other  Weekly team conferences will be held on Wednesdays to discuss your progress.  Your Inpatient Rehabilitation Care Coordinator will talk with you frequently to get your input and to update you on team discussions.  Team conferences with you and your family in attendance may also be held.  Expected length of stay: 13-16 Days  Overall anticipated outcome: Supervision  Depending on your progress and recovery, your program may change. Your Inpatient Rehabilitation Care Coordinator will coordinate services and will keep you informed of any changes. Your Inpatient Rehabilitation Care Coordinator's name and contact numbers are listed  below.  The following services may also be recommended but are not provided by the Inpatient Rehabilitation Center:    Home Health Rehabiltiation Services  Outpatient Rehabilitation Services    Arrangements will be made to provide these services after discharge if needed.  Arrangements include referral to agencies that provide these services.  Your insurance has been verified to be:  Medicare A and B Your primary doctor is:  Corrie Mckusick, MD  Pertinent  information will be shared with your doctor and your insurance company.  Inpatient Rehabilitation Care Coordinator:  Lavera Guise, [place] [phone] or [phone]  Information discussed with and copy given to patient by: Andria Rhein, 04/09/2021, 11:23 AM

## 2021-04-09 NOTE — Progress Notes (Signed)
Physical Therapy Session Note  Patient Details  Name: Maxwell Rollins MRN: [id number] Date of Birth: 10/15/1936  Today's Date: 04/09/2021 PT Individual Time: 8546-2703 and 5009-3818 PT Individual Time Calculation (min): 63 min and 45 min   Short Term Goals: Week 1:  PT Short Term Goal 1 (Week 1): Pt will perform stand transfer from bed to chair with min assist. PT Short Term Goal 2 (Week 1): Pt will perform sit to stand transfer with min assist using LRAD. PT Short Term Goal 3 (Week 1): Pt will ambulate 53ft with min assist using LRAD. PT Short Term Goal 4 (Week 1): Pt will perform bed mobility with mod assist.  Skilled Therapeutic Interventions/Progress Updates:  Session 1: Patient seated upright in recliner upon PT arrival. Patient alert and agreeable to PT session. Patient denied pain during session.  Therapeutic Activity: Transfers: Patient performed STS throughout session recliner <> RW and to/ from toilet with Mod A. With push-to-stand technique, pt is unable to initiate and complete on own. He struggles with posterior bias and requires Mod/ Max A with vc throughout for increasing forward lean to initiate and complete into upright stance. Using pull-to-stand technique with STEDY or safety rail inbathroom, pt is able to perform STS with Min A requiring continued vc for forward lean and for sequencing pivot stepping and reaching positioning prior to transfer to seat.   Neuromuscular Re-ed: NMR facilitated during session with focus on standing balance. Pt guided in improving forward lean into hands on RW placed anteriorly as well as to increase WB into toes of feet as pt tends to bring center of mass over or behind heels demonstrating posterior LOB and requiring Mod/ Max A to maintain upright balance. Several bouts of standing at RW in room and placement of feet posterior to hands in order to improve current proprioception and maintain upright standing balance. NMR performed for  improvements in motor control and coordination, balance, sequencing, judgement, and self confidence/ efficacy in performing all aspects of mobility at highest level of independence.   Patient seated in recliner at end of session with brakes locked, belt alarm set, and all needs within reach.  RN and NT educated re: pt's balance issues and level of assist required for transfers. Due to posterior balance bias, pt is more safe with use of STEDY for transfers to toilet and is to use toilet for all toileting. Discontinue use of brief in bed or bed pan. If pt is already in w/c, then use of safety rail to reach toilet will only require one person assist. Pt may however require close supervision as he may be tempted to stand from toilet if left too long unattended.  Session 2: Patient seated in recliner upon PT arrival. Patient alert and agreeable to PT session. Patient denied pain during session.  Therapeutic Activity: Transfers: Patient performed STS with push-to-stand technique requiring Mod A and extensive vc for forward propulsion. SPVT recliner <> w/c requires Min to Mod A for balance and continuous vc for sequencing walker mgmt and pivot stepping. STS within parallel bars with pull-to-stand technique with Min A/ supervision and pt requiring up to Mod A for repositioning of BUE for improved upright posture and attaining/ maintaining standing balance.   Gait Training:  Patient ambulated 8 feet using parallel bars with Max A for sequencing. Ambulated with ability to pick up each foot but unable to advance forward without Max A for hand progression, LE progression with appropriate weight shift and forward propulsion of hip. Gait belt  around hips to assist with forward lean to assist with propulsion. Provided verbal cues for technique and sequence throughout.  Neuromuscular Re-ed: NMR facilitated during session with focus on standing balance in parallel bars. Pt guided in toe touches to 2" step requiring  Mod A initially and pt able to improve to CGA. Demos motor perseveration of stepping onto step when attempting to progress into ambulation. Pt also guided in performance of minisquats as pt demos extreme difficulty in hip flexion and reaching hips posteriorly for seat when performing stand-->sit. Has tendency to drop hips straight down rather than bringing head forward and hips back for overall balance maintenance. Requires Mod A to perform. NMR performed for improvements in motor control and coordination, balance, sequencing, judgement, and self confidence/ efficacy in performing all aspects of mobility at highest level of independence.   Patient seated upright in recliner at end of session with brakes locked, belt alarm set, and all needs within reach ready for dinner arrival.    Therapy Documentation Precautions:  Precautions Precautions: Fall,Other (comment) Precaution Comments: Monitor BP. Posterior lean Restrictions Weight Bearing Restrictions: No  Therapy/Group: Individual Therapy  Loel Dubonnet PT, DPT 04/09/2021, 7:35 PM

## 2021-04-09 NOTE — Progress Notes (Signed)
PROGRESS NOTE   Subjective/Complaints:  Pt reports no pain- LBM yesterday.  Eating breakfast with NT supervision.      ROS- Pt denies SOB, abd pain, CP, N/V/C/D, and vision changes   Objective:   No results found. Recent Labs    04/09/21 0643  WBC 9.8  HGB 14.2  HCT 42.5  PLT 155   Recent Labs    04/09/21 0643  NA 136  K 4.2  CL 104  CO2 29  GLUCOSE 98  BUN 27*  CREATININE 1.49*  CALCIUM 8.6*    Intake/Output Summary (Last 24 hours) at 04/09/2021 0849 Last data filed at 04/09/2021 0816 Gross per 24 hour  Intake 874 ml  Output 300 ml  Net 574 ml        Physical Exam: Vital Signs Blood pressure 140/79, pulse 66, temperature 98.3 F (36.8 C), temperature source Oral, resp. rate 20, SpO2 96 %.   General: awake, alert, appropriate, sitting up eating breakfast with Supervision; NAD HENT: conjugate gaze; oropharynx moist CV: regular rate; no JVD Pulmonary: CTA B/L; no W/R/R- good air movement GI: soft, NT, ND, (+)BS Psychiatric: appropriate; impulsive Neurological: Ox3   Neurologic: Cranial nerves II through XII intact, motor strength is 5/5 in bilateral deltoid, bicep, tricep, grip, hip flexor, knee extensors, ankle dorsiflexor and plantar flexor Oriented to person , hosp and GBO not time  Musculoskeletal: Full range of motion in all 4 extremities. No joint swelling  Wet voice   Assessment/Plan: 1. Functional deficits which require 3+ hours per day of interdisciplinary therapy in a comprehensive inpatient rehab setting.  Physiatrist is providing close team supervision and 24 hour management of active medical problems listed below.  Physiatrist and rehab team continue to assess barriers to discharge/monitor patient progress toward functional and medical goals  Care Tool:  Bathing    Body parts bathed by patient: Chest,Abdomen,Face,Left arm,Right arm   Body parts bathed by helper: Front  perineal area,Buttocks,Right upper leg,Left upper leg,Right lower leg,Left lower leg     Bathing assist Assist Level: Moderate Assistance - Patient 50 - 74%     Upper Body Dressing/Undressing Upper body dressing   What is the patient wearing?: Pull over shirt    Upper body assist Assist Level: Maximal Assistance - Patient 25 - 49%    Lower Body Dressing/Undressing Lower body dressing      What is the patient wearing?: Incontinence brief     Lower body assist Assist for lower body dressing: Moderate Assistance - Patient 50 - 74%     Toileting Toileting    Toileting assist Assist for toileting: Maximal Assistance - Patient 25 - 49%     Transfers Chair/bed transfer  Transfers assist     Chair/bed transfer assist level: Moderate Assistance - Patient 50 - 74%     Locomotion Ambulation   Ambulation assist   Ambulation activity did not occur: Safety/medical concerns          Walk 10 feet activity   Assist  Walk 10 feet activity did not occur: Safety/medical concerns        Walk 50 feet activity   Assist Walk 50 feet with 2 turns activity did  not occur: Safety/medical concerns         Walk 150 feet activity   Assist Walk 150 feet activity did not occur: Safety/medical concerns         Walk 10 feet on uneven surface  activity   Assist Walk 10 feet on uneven surfaces activity did not occur: Safety/medical concerns         Wheelchair     Assist Will patient use wheelchair at discharge?:  (TBD)             Wheelchair 50 feet with 2 turns activity    Assist            Wheelchair 150 feet activity     Assist          Blood pressure 140/79, pulse 66, temperature 98.3 F (36.8 C), temperature source Oral, resp. rate 20, SpO2 96 %.     Medical Problem List and Plan: 1.  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              -patient may shower             -ELOS/Goals: 12-16 days/Supervision initially but is doing well and will likely d/c sooner             con't PT, OT and SLP 2.  Antithrombotics: -DVT/anticoagulation:  Pharmaceutical: Lovenox             -antiplatelet therapy: DAPT X 3 weeks followed by ASA alone.  3. Lumbar stenosis/Pain Management: Denies back pain. N/A 4. Mood: LCSW to follow for evaluation and support.              -antipsychotic agents: N/A 5. Neuropsych: This patient is ?not fully capable of making decisions on his own behalf. 6. Skin/Wound Care: Routine pressure relief measures 7. Fluids/Electrolytes/Nutrition: Monitor I/Os             CMP ordered for Monday 8. Dyslipidemia              High dose statin. 9. HTN: Monitor BP TID             Norvasc increase to 10mg             4/11- BP controlled- looks better- con't regimen Vitals:   04/08/21 2031 04/09/21 0325  BP: 129/87 140/79  Pulse: 72 66  Resp: 20 20  Temp: 98.6 F (37 C) 98.3 F (36.8 C)  SpO2: 95% 96%  controlled 4/10 10. Thrombocytopenia             Plts 141 on 4/6, labs ordered for Monday  4/11- Plts 155k today- is better 11.  Wet voice -appreciate SLP eval 12. CKD? Vs AKI  4/11- Cr up to 1.49- and BUN up to 27 from 18- will push fluids and if doesn't improve, might need IVFs.     LOS: 3 days A FACE TO FACE EVALUATION WAS PERFORMED  Maxwell Rollins 04/09/2021, 8:49 AM

## 2021-04-09 NOTE — Progress Notes (Signed)
Patient ID: Maxwell Rollins, male   DOB: Dec 15, 1936, 85 y.o.   MRN: [id number]   Pt HCPOA paperwork will be completed on the floor today at 3:30 PM  [place], [place] [phone]

## 2021-04-10 LAB — GLUCOSE, CAPILLARY
Glucose-Capillary: 101 mg/dL — ABNORMAL HIGH (ref 70–99)
Glucose-Capillary: 126 mg/dL — ABNORMAL HIGH (ref 70–99)

## 2021-04-10 LAB — BASIC METABOLIC PANEL
Anion gap: 8 (ref 5–15)
BUN: 29 mg/dL — ABNORMAL HIGH (ref 8–23)
CO2: 28 mmol/L (ref 22–32)
Calcium: 8.7 mg/dL — ABNORMAL LOW (ref 8.9–10.3)
Chloride: 102 mmol/L (ref 98–111)
Creatinine, Ser: 1.51 mg/dL — ABNORMAL HIGH (ref 0.61–1.24)
GFR, Estimated: 46 mL/min — ABNORMAL LOW (ref >60)
Glucose, Bld: 102 mg/dL — ABNORMAL HIGH (ref 70–99)
Potassium: 3.8 mmol/L (ref 3.5–5.1)
Sodium: 138 mmol/L (ref 135–145)

## 2021-04-10 MED ORDER — SODIUM CHLORIDE 0.45 % IV SOLN: INTRAVENOUS | Status: DC

## 2021-04-10 NOTE — Progress Notes (Signed)
Physical Therapy Session Note  Patient Details  Name: Maxwell Rollins MRN: [id number] Date of Birth: 1936/11/19  Today's Date: 04/10/2021 PT Individual Time: 1050-1120 and 1420-1503 PT Individual Time Calculation (min): 30 min and 43 min   Short Term Goals: Week 1:  PT Short Term Goal 1 (Week 1): Pt will perform stand transfer from bed to chair with min assist. PT Short Term Goal 2 (Week 1): Pt will perform sit to stand transfer with min assist using LRAD. PT Short Term Goal 3 (Week 1): Pt will ambulate 50ft with min assist using LRAD. PT Short Term Goal 4 (Week 1): Pt will perform bed mobility with mod assist.  Skilled Therapeutic Interventions/Progress Updates:  Session 1: Patient seated in recliner with BLE elevated upon PT arrival. Patient alert and agreeable to PT session. Patient denied pain during session. Continued to demonstrate difficulty attaining balance with posterior bias. Time spent with education re: center of mass over BOS and biomechanics of rise to stand and descending to sit.   Therapeutic Activity: Transfers: Patient performed STS and SPVT turns ModA for power up and significant push into forward lean to overcome posterior bias in rise to stand. Mod A to attain balance and intermittent Mod A to maintain. Pivot stepping performed with CGA until backward stepping with pt then requiring up to Mod A to maintain balance and prevent posterior LOB into seat. Provided vc/tc throughout for forward lean and technique  in required direction to turn to reach destination.  Gait Training:  Patient ambulated 8' x2 using parallel bars with Min A initially and improving to CGA.  Ambulated with continued posterior bias, inattention to hand placement, decreased step height/ length. Provided vc/tc for hand advancement and maintaining slight forward lean.   Neuromuscular Re-ed: NMR facilitated during session with focus on standing balance. Pt guided blocked practice of positioning for  STS within parallel bars allowing for failure and slight discomfort with initial trial and then improving initial positioning for significant improvement in effort and technique by end of 6 reps. Pt guided in minisquat technique for increased hip and knee flexion in order to improve descent to sit as pt continues to demo difficulty with bodily flexion required for effective, controlled descent to sit. Will continue to require NMR training for improvement in bringing shoulders forward and hips posteriorly.  NMR performed for improvements in motor control and coordination, balance, sequencing, judgement, and self confidence/ efficacy in performing all aspects of mobility at highest level of independence.   Patient seated in recliner at end of session with brakes locked, belt alarm set, and all needs within reach.  Session 2:  Patient seated upright in recliner upon PT arrival. Patient alert and agreeable to PT session. Patient denied pain during session.  Therapeutic Activity: Transfers: Improved performance with transfers this afternoon session with pt able to STS from recliner to RW with CGA. Patient performed multiple STS and SPVT transfers throughout session using RW and requiring CGA/ light Min A for power up. Provided verbal cues for technique and forward lean.  Gait Training:  Patient ambulated 335 feet using RW with CGA. Ambulated with flexed forward posture, slow pace, decreased step height/ length. Provided verbal cues for increasing step height, improved proximity to walker and upright posture. Pt is not able to correct this session. Pace is continuous throughout and despite questions to sit, pt relates he has been sitting for days and he does "not want to stop walking now".  Patient seated in recliner at end of  session with brakes locked, belt alarm set, and all needs within reach.   Therapy Documentation Precautions:  Precautions Precautions: Fall,Other (comment) Precaution Comments:  Monitor BP. Posterior lean Restrictions Weight Bearing Restrictions: No   Therapy/Group: Individual Therapy  Loel Dubonnet PT, DPT 04/10/2021, 6:55 PM

## 2021-04-10 NOTE — Progress Notes (Addendum)
Speech Language Pathology Daily Session Note  Patient Details  Name: Isahi Godwin MRN: [id number] Date of Birth: 18-May-1936  Today's Date: 04/10/2021 SLP Individual Time: 1005-1045 SLP Individual Time Calculation (min): 40 min  Short Term Goals: Week 1: SLP Short Term Goal 1 (Week 1): Patient will demonstrate intellectual awareness of current deficits in cognition, vocal intensity and vocal quality with modA. SLP Short Term Goal 2 (Week 1): Patient will tolerate current diet regular/thin liquids with min A cues for use of trained strategies (intermittent throat clear, alternate solids and liquids, small bites/sips). SLP Short Term Goal 3 (Week 1): Patient will demonstrate functional problem solving for basic and familair tasks with Mod verbal cues. SLP Short Term Goal 4 (Week 1): Patient will utilize external memory aids for orientation to place, date and situation with Min verbal and visual cues. SLP Short Term Goal 5 (Week 1): Pt will utilize compensatory strategies as trained to increase vocal intensity and monitor vocal quality provided mod A verbal cues  Skilled Therapeutic Interventions: Skilled SLP intervention focused on cognition. Pt completed money calculation and medication management tasks using ALFA subtests. Increased cueing needed with coin calculations of increased complexity. Pt needed verbal and visual cues to direct him during task and increased time for processing.  Max A required with simple pill sorting task Pt stated his wife usually sorts pills for him at home. He was able to read simple medication bottle labels in ALFA subtest with Min A verbal cues. Cont with therapy per plan of care.      Pain Pain Assessment Pain Scale: Faces Pain Score: 0-No pain Faces Pain Scale: No hurt  Therapy/Group: Individual Therapy  Carlean Jews Kenitra Leventhal 04/10/2021, 10:43 AM

## 2021-04-10 NOTE — Progress Notes (Signed)
PROGRESS NOTE   Subjective/Complaints:      ROS- Pt denies SOB, abd pain, CP, N/V/C/D, and vision changes   Objective:   No results found. Recent Labs    04/09/21 0643  WBC 9.8  HGB 14.2  HCT 42.5  PLT 155   Recent Labs    04/09/21 0643 04/10/21 0513  NA 136 138  K 4.2 3.8  CL 104 102  CO2 29 28  GLUCOSE 98 102*  BUN 27* 29*  CREATININE 1.49* 1.51*  CALCIUM 8.6* 8.7*    Intake/Output Summary (Last 24 hours) at 04/10/2021 0730 Last data filed at 04/09/2021 1841 Gross per 24 hour  Intake 560 ml  Output --  Net 560 ml        Physical Exam: Vital Signs Blood pressure 140/85, pulse 65, temperature 97.6 F (36.4 C), resp. rate 18, SpO2 99 %.  General: No acute distress Mood and affect are appropriate Heart: Regular rate and rhythm no rubs murmurs or extra sounds Lungs: Clear to auscultation, breathing unlabored, no rales or wheezes Abdomen: Positive bowel sounds, soft nontender to palpation, nondistended Extremities: No clubbing, cyanosis, or edema Skin: No evidence of breakdown, no evidence of rash  Neurologic: Cranial nerves II through XII intact, motor strength is 5/5 in bilateral deltoid, bicep, tricep, grip, hip flexor, knee extensors, ankle dorsiflexor and plantar flexor Oriented to person , hosp and GBO not time  Musculoskeletal: Full range of motion in all 4 extremities. No joint swelling  Wet voice   Assessment/Plan: 1. Functional deficits which require 3+ hours per day of interdisciplinary therapy in a comprehensive inpatient rehab setting.  Physiatrist is providing close team supervision and 24 hour management of active medical problems listed below.  Physiatrist and rehab team continue to assess barriers to discharge/monitor patient progress toward functional and medical goals  Care Tool:  Bathing    Body parts bathed by patient: Chest,Abdomen,Face,Left arm,Right arm   Body parts  bathed by helper: Front perineal area,Buttocks,Right upper leg,Left upper leg,Right lower leg,Left lower leg     Bathing assist Assist Level: Moderate Assistance - Patient 50 - 74%     Upper Body Dressing/Undressing Upper body dressing   What is the patient wearing?: Pull over shirt    Upper body assist Assist Level: Maximal Assistance - Patient 25 - 49%    Lower Body Dressing/Undressing Lower body dressing      What is the patient wearing?: Incontinence brief     Lower body assist Assist for lower body dressing: Moderate Assistance - Patient 50 - 74%     Toileting Toileting    Toileting assist Assist for toileting: Maximal Assistance - Patient 25 - 49%     Transfers Chair/bed transfer  Transfers assist     Chair/bed transfer assist level: Moderate Assistance - Patient 50 - 74%     Locomotion Ambulation   Ambulation assist   Ambulation activity did not occur: Safety/medical concerns          Walk 10 feet activity   Assist  Walk 10 feet activity did not occur: Safety/medical concerns        Walk 50 feet activity   Assist Walk 50  feet with 2 turns activity did not occur: Safety/medical concerns         Walk 150 feet activity   Assist Walk 150 feet activity did not occur: Safety/medical concerns         Walk 10 feet on uneven surface  activity   Assist Walk 10 feet on uneven surfaces activity did not occur: Safety/medical concerns         Wheelchair     Assist Will patient use wheelchair at discharge?: No (Per PT long term goals)             Wheelchair 50 feet with 2 turns activity    Assist            Wheelchair 150 feet activity     Assist          Blood pressure 140/85, pulse 65, temperature 97.6 F (36.4 C), resp. rate 18, SpO2 99 %.     Medical Problem List and Plan: 1.  Deficits with mobility and self-care secondary to acute ischemic nonhemorrhagic infarct involving the mesial left  temporal lobe.             -patient may shower             -ELOS/Goals: 12-16 days/Supervision , Team conf in am              con't PT, OT and SLP 2.  Antithrombotics: -DVT/anticoagulation:  Pharmaceutical: Lovenox             -antiplatelet therapy: DAPT X 3 weeks followed by ASA alone.  3. Lumbar stenosis/Pain Management: Denies back pain. N/A 4. Mood: LCSW to follow for evaluation and support.              -antipsychotic agents: N/A 5. Neuropsych: This patient is ?not fully capable of making decisions on his own behalf. 6. Skin/Wound Care: Routine pressure relief measures 7. Fluids/Electrolytes/Nutrition: Monitor I/Os             CMP ordered for Monday 8. Dyslipidemia              High dose statin. 9. HTN: Monitor BP TID             Norvasc increase to 10mg             4/11- BP controlled- looks better- con't regimen Vitals:   04/09/21 2018 04/10/21 0323  BP: 118/85 140/85  Pulse: 80 65  Resp: 18 18  Temp: (!) 97.5 F (36.4 C) 97.6 F (36.4 C)  SpO2: 97% 99%  controlled 4/12 10. Thrombocytopenia             Plts 141 on 4/6, labs ordered for Monday  4/11- Plts 155k today- is better 11.  Wet voice -appreciate SLP eval 12. CKD? Vs AKI  4/11- Cr up to 1.49- and BUN up to 27 from 18- will push fluids and if doesn't improve, might need IVFs. I 6/11 yesterday  Will start IVF at noc   LOS: 4 days A FACE TO FACE EVALUATION WAS PERFORMED  04/10/2021, 7:30 AM

## 2021-04-10 NOTE — Progress Notes (Signed)
Occupational Therapy Session Note  Patient Details  Name: Maxwell Rollins MRN: [id number] Date of Birth: Sep 16, 1936  Today's Date: 04/10/2021 OT Individual Time: 0806-0900 OT Individual Time Calculation (min): 54 min    Short Term Goals: Week 1:  OT Short Term Goal 1 (Week 1): Pt will be able to rise to stand with mod A of 1 to prep for LB self care. OT Short Term Goal 2 (Week 1): Pt will be able to stand with mod A of 1 while pulling pants over hips with LB dressing. OT Short Term Goal 3 (Week 1): Pt will be able to transfer on and off the toilet with mod A of 1.  Skilled Therapeutic Interventions/Progress Updates:    Pt received semi-reclined in bed with NT present, eating breakfast, agreeable to therapy. Denies pain. Came to sitting EOB with use of bed features + close S + mod VCs for sequencing. STS in stedy ultimately with min A + 2 to power up, pt req extensive VCs and time for initiation. Donned pants at bed level total A, pt able to assist to pull over thighs. Seated in w/c, able to self-feed with close S and no spillage. At sink, completed oral and facial care with close S, pt able to set up all materials. Doffed shirt close S, donned shirt with mod A to thread BUE and pull over torso with increased time. Noted significant difficult problem solving and orientating shirt despite max VCs. Stedy transfer to recliner with CGA to power up.  Pt left in recliner with safety belt alarm engaged, telesitter on,  call bell in reach, and all immediate needs met.    Therapy Documentation Precautions:  Precautions Precautions: Fall,Other (comment) Precaution Comments: Monitor BP. Posterior lean Restrictions Weight Bearing Restrictions: No Pain: Pain Assessment Pain Scale: Faces Pain Score: 0-No pain Faces Pain Scale: No hurt ADL: See Care Tool for more details.   Therapy/Group: Individual Therapy  Volanda Napoleon MS, OTR/L  04/10/2021, 12:47 PM

## 2021-04-11 MED ORDER — TRAZODONE HCL 50 MG PO TABS
25.0000 mg | ORAL_TABLET | Freq: Every evening | ORAL | Status: DC | PRN
  Administered 2021-04-17 – 2021-04-19 (×3): 25 mg via ORAL
  Filled 2021-04-11 (×3): qty 1

## 2021-04-11 NOTE — Patient Care Conference (Signed)
Inpatient RehabilitationTeam Conference and Plan of Care Update Date: 04/11/2021   Time: 10:07 AM    Patient Name: Maxwell Rollins      Medical Record Number: [id number]  Date of Birth: 1936-07-26 Sex: Male         Room/Bed: 4M04C/4M04C-01 Payor Info: Payor: MEDICARE / Plan: MEDICARE PART A AND B / Product Type: *No Product type* /    Admit Date/Time:  04/06/2021  3:22 PM  Primary Diagnosis:  Acute CVA (cerebrovascular accident) [facility])  Hospital Problems: Principal Problem:   Acute CVA (cerebrovascular accident) [facility]) Active Problems:   Stroke (cerebrum) [facility])    Expected Discharge Date: Expected Discharge Date: 04/25/21  Team Members Present: Physician leading conference: Dr. Claudette Laws Care Coodinator Present: Chana Bode, RN, BSN, CRRN;Christina Vita Barley, BSW Nurse Present: Chana Bode, RN PT Present: Other (comment) Sharol Harness, PT) OT Present: Other (comment) Annye English, OT) SLP Present: Philis Pique, SLP PPS Coordinator present : Fae Pippin, SLP     Current Status/Progress Goal Weekly Team Focus  Bowel/Bladder   Patient currently is continent of bowel and bladder. Patient uses the stedy for transfers to and from the bathroom. Last BM: 04/09/2021  Team will continue to toilet patient q2h.  Patient will be toileted q2h to continue normal bowel and bladder pattern.   Swallow/Nutrition/ Hydration   regular thin  regular thin  ensure he is tolerating regular thin   ADL's   S grooming, self feeding; mod UBD, min LBD  min A overall  NMR, postural control, balance, ADL/func mobility retraining, functional cognition, family/pt/DME/AE edu   Mobility   On eval overall Mod/ max A with no ambulation d/t posterior bias in balance; 4/12- balance improving, bed mobility at CGA/ Min A, transfers Min A, ambulation CGA with RW  overall CGA/ Min A  balance, activity tolerance, gait, transfers   Communication             Safety/Cognition/ Behavioral  Observations  Max A with basic tasks  Min A for basic cognition  functional problem solving, orientation to medical situation.   Pain   Patient denies pain at this time.  Pain will be assessed qshift and prn.  Team will assess patients pain qshift and prn. PRN analgesics will be administered if patient voices c/o pain.   Skin   Patient currently has a skin tear with treatment orders to right anterior leg.  No s/s of infection to skin tear on right leg. Skin will be assessed qshift and as needed.  Continue treatment to right leg and monitor for s/s of infection. Skin will be assessed qshift and prn. The remainder of patients skin will remain intact.     Discharge Planning:  Pt plans to d/c home with family to provide 24/7 care at supervision level   Team Discussion: Premorbid cognitive issues complicated by cerebrovascular infarct.  Fair po intake, IVF at [facility]. Some confusion at night; currently using tele-sitter for safety and encouraging patient to call for assistance.  Patient on target to meet rehab goals: yes, currently mod assist for upper body care and min assist for lower body care. Min assist for transfers and toileting and able to ambulate with a RW and CGA. Functional status fluctuates and patient is inconsistent. Progress limited by balance issues with a posterior lean bias  *See Care Plan and progress notes for long and short-term goals.   Revisions to Treatment Plan:  Work on simple memory, orientation and added a calendar in room  Teaching Needs: Transfers,  toileting, medications, safety, etc.   Current Barriers to Discharge: Decreased caregiver support and Home enviroment access/layout  Possible Resolutions to Barriers: Family education    Medical Summary Current Status: poor fluid intake, chronic cognitive deficits worsenend by CVA, BPs stable  Barriers to Discharge: Medical stability       Continued Need for Acute Rehabilitation Level of Care: The patient  requires daily medical management by a physician with specialized training in physical medicine and rehabilitation for the following reasons: Direction of a multidisciplinary physical rehabilitation program to maximize functional independence : Yes Medical management of patient stability for increased activity during participation in an intensive rehabilitation regime.: Yes Analysis of laboratory values and/or radiology reports with any subsequent need for medication adjustment and/or medical intervention. : Yes   I attest that I was present, lead the team conference, and concur with the assessment and plan of the team.   Chana Bode B 04/11/2021, 1:02 PM

## 2021-04-11 NOTE — Progress Notes (Signed)
Occupational Therapy Session Note  Patient Details  Name: Maxwell Rollins MRN: [id number] Date of Birth: 16-Mar-1936  Today's Date: 04/11/2021 OT Individual Time: 7858-8502 OT Individual Time Calculation (min): 55 min    Short Term Goals: Week 1:  OT Short Term Goal 1 (Week 1): Pt will be able to rise to stand with mod A of 1 to prep for LB self care. OT Short Term Goal 2 (Week 1): Pt will be able to stand with mod A of 1 while pulling pants over hips with LB dressing. OT Short Term Goal 3 (Week 1): Pt will be able to transfer on and off the toilet with mod A of 1.  Skilled Therapeutic Interventions/Progress Updates:    Pt received on toilet with NT present, agreeable to therapy. Denies pain. Cont void of bladder on toilet, close S for anterior pericare. Donned brief with min A to thread BLE and CGA for STS. Donned pants with CGA for STS. Attempted stand-pivot with RW, but pt req max VCs for sequencing split-hand tech and for anterior WS. Significant posterior bias in standing. When RW taken away, pt able to complete stand-pivot with use of R grab bar and CGA for balance. In w/c seated at sink, completed oral and facial care with close S, pt able to set up all materials himself. Doffed B socks mod A to remove L sock, donned B socks with min A to thread L sock. W/c transfer to and from gym 2/2 time management. STS with close S with RW to match cards, completed within 100% accuracy within a reasonable amount of time. Stand-pivot back to recliner with multiple STS attempts. Able to complete with min A for balance and use of bed rail.   Pt left in recliner with safety belt alarm engaged, call bell in reach, and all immediate needs met.    Therapy Documentation Precautions:  Precautions Precautions: Fall,Other (comment) Precaution Comments: Monitor BP. Posterior lean Restrictions Weight Bearing Restrictions: No Pain: denies   ADL: See Care Tool for more details.  Therapy/Group:  Individual Therapy  Volanda Napoleon MS, OTR/L  04/11/2021, 6:45 AM

## 2021-04-11 NOTE — Progress Notes (Signed)
Patient ID: Maxwell Rollins, male   DOB: Sep 10, 1936, 85 y.o.   MRN: [id number] Team Conference Report to Patient/Family  Team Conference discussion was reviewed with the patient and caregiver, including goals, any changes in plan of care and target discharge date.  Patient and caregiver express understanding and are in agreement.  The patient has a target discharge date of 04/25/21.   SW left VM for pt daughter, will wait for her follow up call to provide updates.   Andria Rhein 04/11/2021, 1:31 PM

## 2021-04-11 NOTE — Progress Notes (Signed)
Speech Language Pathology Daily Session Note  Patient Details  Name: Maxwell Rollins MRN: [id number] Date of Birth: August 09, 1936  Today's Date: 04/11/2021 SLP Individual Time: 0730-0830 SLP Individual Time Calculation (min): 60 min  Short Term Goals: Week 1: SLP Short Term Goal 1 (Week 1): Patient will demonstrate intellectual awareness of current deficits in cognition, vocal intensity and vocal quality with modA. SLP Short Term Goal 2 (Week 1): Patient will tolerate current diet regular/thin liquids with min A cues for use of trained strategies (intermittent throat clear, alternate solids and liquids, small bites/sips). SLP Short Term Goal 3 (Week 1): Patient will demonstrate functional problem solving for basic and familair tasks with Mod verbal cues. SLP Short Term Goal 4 (Week 1): Patient will utilize external memory aids for orientation to place, date and situation with Min verbal and visual cues. SLP Short Term Goal 5 (Week 1): Pt will utilize compensatory strategies as trained to increase vocal intensity and monitor vocal quality provided mod A verbal cues  Skilled Therapeutic Interventions: Skilled SLP intervention focused on cognition and dysphagia. Pt seen with regular breakfast tray and thin liquids. He demonstrated slow rate and took single bites/sips with regular solids and thin liquids. Oral clearance was wfl. Pt completed calendar planning task and needed mod-max A verbal and visual cues for attention to details and error awareness. Pt reported use of a calender when home to increase recall of important appointments. Pt left seated upright In bed with bed alarm set and call bell within reach. Pt oriented to date and day of the week with min A verbal cues and visual cues. Cont with therapy per plan of care.     Pain Pain Assessment Pain Scale: Faces Pain Score: 0-No pain Faces Pain Scale: No hurt  Therapy/Group: Individual Therapy  Carlean Jews Tvisha Schwoerer 04/11/2021, 8:22 AM

## 2021-04-11 NOTE — Progress Notes (Signed)
Physical Therapy Session Note  Patient Details  Name: Maxwell Rollins MRN: [id number] Date of Birth: [birth year]  Today's Date: 04/11/2021 PT Individual Time: 6222-9798 PT Individual Time Calculation (min): 74 min   Short Term Goals: Week 1:  PT Short Term Goal 1 (Week 1): Pt will perform stand transfer from bed to chair with min assist. PT Short Term Goal 2 (Week 1): Pt will perform sit to stand transfer with min assist using LRAD. PT Short Term Goal 3 (Week 1): Pt will ambulate 85ft with min assist using LRAD. PT Short Term Goal 4 (Week 1): Pt will perform bed mobility with mod assist.  Skilled Therapeutic Interventions/Progress Updates:  Patient seated upright in recliner with wife and wife's best friend in room upon PT arrival. Patient alert and agreeable to PT session stating that MD is about to come and discuss pt's health and progress. Wife states that MD is not expected to come. Patient denied pain during session.  Pt consistently denies toileting at each session and may require timed toileting with staff to ensure incontinence training.  Therapeutic Activity: Transfers: Patient performed STS  requiring Max A for power up from seat and Mod A to complete to upright stance. Is unable to attain full balance requiring Min/ mod A to maintain standing balance at RW with heavy lean to R which pt notices. Provided verbal cues for forward placement of RW, increasing anterior lean.   After NMR training, pt improves to Min A for STS and SPVT for rise to stand and completing pivoting, and continues to require Mod A to complete descent to sit.   Guided in NuStep L4 x with focus on BLE only, then added BUE for forward hand placement proprioception for additional .   Gait Training:  Patient ambulated 15' in parallel bars x2 demonstrating ability to advance BUE reciprocally with vc/ tc. Pt then guided in amb 235' x1 using RW and CGA with w/c follow for balance/ fatigue. Pt with  decline in quality of gait demonstrating more Parkinsonian style of gait with intermittent stutter stepping, intermittent freezing, and intermittent ability to follow vc for requested increase in step height/ length.     Neuromuscular Re-ed: NMR facilitated during session with focus on maintaining forward hand placement and standing balance. Transfer training moved to parallel bars with pt able to pull to stand with supervision and able to attain balance quickly and adjust hand positioning with vc/ tc for improved balance. Able to maintain balance with UUE support and then requires time to find and maintain with no UE support. Guided in increasing forward lean with hip and knee flexion to improve descent to sit and posterior reach of hips into seat rather than posterior fall into seat.  NMR performed for improvements in motor control and coordination, balance, sequencing, judgement, and self confidence/ efficacy in performing all aspects of mobility at highest level of independence.   Patient seated in recliner at end of session with brakes locked, belt alarm set, and all needs within reach.   Therapy Documentation Precautions:  Precautions Precautions: Fall,Other (comment) Precaution Comments: Monitor BP. Posterior lean Restrictions Weight Bearing Restrictions: No   Therapy/Group: Individual Therapy  Loel Dubonnet PT, DPT 04/11/2021, 5:26 PM

## 2021-04-11 NOTE — Progress Notes (Signed)
PROGRESS NOTE   Subjective/Complaints:  Poor po intake  , pt does not remember my visit yesterday   Working with SLP, wife reportedly did med management for pt PTA  ROS- Pt denies SOB, abd pain, CP, N/V   Objective:   No results found. Recent Labs    04/09/21 0643  WBC 9.8  HGB 14.2  HCT 42.5  PLT 155   Recent Labs    04/09/21 0643 04/10/21 0513  NA 136 138  K 4.2 3.8  CL 104 102  CO2 29 28  GLUCOSE 98 102*  BUN 27* 29*  CREATININE 1.49* 1.51*  CALCIUM 8.6* 8.7*    Intake/Output Summary (Last 24 hours) at 04/11/2021 0824 Last data filed at 04/11/2021 0816 Gross per 24 hour  Intake 472 ml  Output --  Net 472 ml        Physical Exam: Vital Signs Blood pressure 123/80, pulse 62, temperature 97.9 F (36.6 C), resp. rate 16, SpO2 97 %.  General: No acute distress Mood and affect are appropriate Heart: Regular rate and rhythm no rubs murmurs or extra sounds Lungs: Clear to auscultation, breathing unlabored, no rales or wheezes Abdomen: Positive bowel sounds, soft nontender to palpation, nondistended Extremities: No clubbing, cyanosis, or edema Skin: No evidence of breakdown, no evidence of rash Neurologic: Cranial nerves II through XII intact, motor strength is 5/5 in bilateral deltoid, bicep, tricep, grip, hip flexor, knee extensors, ankle dorsiflexor and plantar flexor Oriented to person , day  Musculoskeletal: Full range of motion in all 4 extremities. No joint swelling   Assessment/Plan: 1. Functional deficits which require 3+ hours per day of interdisciplinary therapy in a comprehensive inpatient rehab setting.  Physiatrist is providing close team supervision and 24 hour management of active medical problems listed below.  Physiatrist and rehab team continue to assess barriers to discharge/monitor patient progress toward functional and medical goals  Care Tool:  Bathing    Body parts  bathed by patient: Chest,Abdomen,Face,Left arm,Right arm   Body parts bathed by helper: Front perineal area,Buttocks,Right upper leg,Left upper leg,Right lower leg,Left lower leg     Bathing assist Assist Level: Moderate Assistance - Patient 50 - 74%     Upper Body Dressing/Undressing Upper body dressing   What is the patient wearing?: Pull over shirt    Upper body assist Assist Level: Moderate Assistance - Patient 50 - 74%    Lower Body Dressing/Undressing Lower body dressing      What is the patient wearing?: Incontinence brief     Lower body assist Assist for lower body dressing: Total Assistance - Patient < 25%     Toileting Toileting    Toileting assist Assist for toileting: Maximal Assistance - Patient 25 - 49%     Transfers Chair/bed transfer  Transfers assist     Chair/bed transfer assist level: Minimal Assistance - Patient > 75%     Locomotion Ambulation   Ambulation assist   Ambulation activity did not occur: Safety/medical concerns  Assist level: Contact Guard/Touching assist Assistive device: Walker-rolling Max distance: 335 ft   Walk 10 feet activity   Assist  Walk 10 feet activity did not occur: Safety/medical concerns  Assist level: Contact Guard/Touching assist Assistive device: Walker-rolling   Walk 50 feet activity   Assist Walk 50 feet with 2 turns activity did not occur: Safety/medical concerns  Assist level: Minimal Assistance - Patient > 75% Assistive device: Walker-rolling    Walk 150 feet activity   Assist Walk 150 feet activity did not occur: Safety/medical concerns  Assist level: Contact Guard/Touching assist Assistive device: Walker-rolling    Walk 10 feet on uneven surface  activity   Assist Walk 10 feet on uneven surfaces activity did not occur: Safety/medical concerns         Wheelchair     Assist Will patient use wheelchair at discharge?: No (Per PT long term goals)              Wheelchair 50 feet with 2 turns activity    Assist            Wheelchair 150 feet activity     Assist          Blood pressure 123/80, pulse 62, temperature 97.9 F (36.6 C), resp. rate 16, SpO2 97 %.     Medical Problem List and Plan: 1.  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             -patient may shower             -ELOS/Goals: 12-16 days/Supervision ,Team conference today please see physician documentation under team conference tab, met with team  to discuss problems,progress, and goals. Formulized individual treatment plan based on medical history, underlying problem and comorbidities.               con't PT, OT and SLP 2.  Antithrombotics: -DVT/anticoagulation:  Pharmaceutical: Lovenox             -antiplatelet therapy: DAPT X 3 weeks followed by ASA alone.  3. Lumbar stenosis/Pain Management: Denies back pain. N/A 4. Mood: LCSW to follow for evaluation and support.              -antipsychotic agents: N/A 5. Neuropsych: This patient is ?not fully capable of making decisions on his own behalf. 6. Skin/Wound Care: Routine pressure relief measures 7. Fluids/Electrolytes/Nutrition: Monitor I/Os             CMP ordered for Monday 8. Dyslipidemia              High dose statin. 9. HTN: Monitor BP TID             Norvasc increase to 16m            4/11- BP controlled- looks better- con't regimen Vitals:   04/10/21 1834 04/11/21 0415  BP: 97/79 123/80  Pulse: 72 62  Resp: 19 16  Temp: 98.3 F (36.8 C) 97.9 F (36.6 C)  SpO2: 98% 97%  controlled 4/13 10. Thrombocytopenia             Plts 141 on 4/6, labs ordered for Monday  4/11- Plts 155k today- is better 11.  Wet voice -appreciate SLP eval 12. CKD? Vs AKI  4/11- Cr up to 1.49- and BUN up to 27 from 18- will push fluids and if doesn't improve, might need IVFs. I [id number]  Will start IVF at noc    LOS: 5 days [place] [name] 04/11/2021, 8:24 AM

## 2021-04-12 NOTE — Progress Notes (Signed)
Physical Therapy Session Note  Patient Details  Name: Maxwell Rollins MRN: [id number] Date of Birth: Apr 23, 1936  Today's Date: 04/12/2021 PT Individual Time: 0920-1015 PT Individual Time Calculation (min): 55 min   Short Term Goals: Week 1:  PT Short Term Goal 1 (Week 1): Pt will perform stand transfer from bed to chair with min assist. PT Short Term Goal 2 (Week 1): Pt will perform sit to stand transfer with min assist using LRAD. PT Short Term Goal 3 (Week 1): Pt will ambulate 51f with min assist using LRAD. PT Short Term Goal 4 (Week 1): Pt will perform bed mobility with mod assist.  Skilled Therapeutic Interventions/Progress Updates:   Pt received supine in bed and agreeable to PT. Supine>sit transfer with min assist and cues for safety and sequencing. Sit>stand with min assist and RW. Stand pivot to WC with CGA. Cues for anterior weight shift initially,but able to sustain throughout the remainder of the session. Clothing mangement to doff dirty brief and don clean pants.   Gait training with RW x 2095fwith CGA with cues for increased step length BLE.  And gait training without AD and HHA on the R x [id number] assist overall with noted improvement in step length compared to with RW.   Dynamic gait training with no AD to weave through 8 cones x 2 and step over 6 canes x 2. Min assist overall with 1 bout of mod assist due to posterior LOB from taking too large a step over cane. Able to correct technique and no additional LOB.   Step management training with BUE support on rails x 8 BLE cues for posture and improved step length   Nustep x 8 minutes with BUE/BLE for improved activity tolerence with cues for increased speed.   Patient returned to room and performed stand pivot to recliner with no Ad and min assist. Pt left sitting in recliner with call bell in reach and all needs met.        Therapy Documentation Precautions:  Precautions Precautions: Fall,Other  (comment) Precaution Comments: Monitor BP. Posterior lean Restrictions Weight Bearing Restrictions: No General:      Pain:  denies  Therapy/Group: Individual Therapy  AustiLorie Phenix/2022, 10:19 AM

## 2021-04-12 NOTE — Progress Notes (Signed)
Occupational Therapy Session Note  Patient Details  Name: Maxwell Rollins MRN: [id number] Date of Birth: 24-Nov-1936  Today's Date: 04/12/2021 OT Individual Time: 2035-5974 OT Individual Time Calculation (min): 47 min    Short Term Goals: Week 1:  OT Short Term Goal 1 (Week 1): Pt will be able to rise to stand with mod A of 1 to prep for LB self care. OT Short Term Goal 2 (Week 1): Pt will be able to stand with mod A of 1 while pulling pants over hips with LB dressing. OT Short Term Goal 3 (Week 1): Pt will be able to transfer on and off the toilet with mod A of 1.  Skilled Therapeutic Interventions/Progress Updates:    Pt in bedside recliner to start session.  He was able to transfer to the wheelchair stand pivot with mod assist and no device to begin session.  Therapist took him down to the ortho gym where he worked on standing balance with use of the BITS system.  He was able to stand with min assist using the RW for support and without while completing 2 minute intervals using Visual Pursuits program.  He averaged 95% accuracy using the RUE and 2.2 seconds between dots.  Progressed to Cognitive word sequencing task in standing as well with the BITS and he exhibited moderate difficulty understanding and following the directions.  He continued to try and press the words during the five second memory sequence, instead of waiting for them to disappear to locate them randomly placed on the board.  He was not able to memorize and correctly identify three word sequence.  Finished session with return to the room to toilet secondary to pt demonstrating some bowel incontinence in the brief.  He was able to ambulate to the bathroom with min assist using the RW.  Increased difficulty with motor planning turning around and lining up with the seat.  Mod assist for clothing management with total assist for donning new brief.  Therapist assisted with toilet hygiene as well as max assist.  Finished session  with transfer out to the recliner where he was left with the call button and phone in reach with safety alarm belt in place.     Therapy Documentation Precautions:  Precautions Precautions: Fall,Other (comment) Precaution Comments: Monitor BP. Posterior lean Restrictions Weight Bearing Restrictions: No  Pain: Pain Assessment Pain Scale: Faces Pain Score: 0-No pain Faces Pain Scale: No hurt ADL: See Care Tool Section for some details of mobility and selfcare  Therapy/Group: Individual Therapy  Zooey Schreurs OTR/L 04/12/2021, 3:53 PM

## 2021-04-12 NOTE — Progress Notes (Signed)
PROGRESS NOTE   Subjective/Complaints:  Pt unaware of d/c date , we discussed this  ROS- Pt denies SOB, abd pain, CP, N/V   Objective:   No results found. No results for input(s): WBC, HGB, HCT, PLT in the last 72 hours. Recent Labs    04/10/21 0513  NA 138  K 3.8  CL 102  CO2 28  GLUCOSE 102*  BUN 29*  CREATININE 1.51*  CALCIUM 8.7*    Intake/Output Summary (Last 24 hours) at 04/12/2021 0728 Last data filed at 04/11/2021 2330 Gross per 24 hour  Intake 472.62 ml  Output 300 ml  Net 172.62 ml        Physical Exam: Vital Signs Blood pressure 109/71, pulse 63, temperature 97.7 F (36.5 C), temperature source Oral, resp. rate 20, SpO2 95 %.  General: No acute distress Mood and affect are appropriate Heart: Regular rate and rhythm no rubs murmurs or extra sounds Lungs: Clear to auscultation, breathing unlabored, no rales or wheezes Abdomen: Positive bowel sounds, soft nontender to palpation, nondistended Extremities: No clubbing, cyanosis, or edema Skin: No evidence of breakdown, no evidence of rash  Neurologic: Cranial nerves II through XII intact, motor strength is 5/5 in bilateral deltoid, bicep, tricep, grip, hip flexor, knee extensors, ankle dorsiflexor and plantar flexor Oriented to person , day  Musculoskeletal: Full range of motion in all 4 extremities. No joint swelling   Assessment/Plan: 1. Functional deficits which require 3+ hours per day of interdisciplinary therapy in a comprehensive inpatient rehab setting.  Physiatrist is providing close team supervision and 24 hour management of active medical problems listed below.  Physiatrist and rehab team continue to assess barriers to discharge/monitor patient progress toward functional and medical goals  Care Tool:  Bathing    Body parts bathed by patient: Chest,Abdomen,Face,Left arm,Right arm   Body parts bathed by helper: Front perineal  area,Buttocks,Right upper leg,Left upper leg,Right lower leg,Left lower leg     Bathing assist Assist Level: Moderate Assistance - Patient 50 - 74%     Upper Body Dressing/Undressing Upper body dressing   What is the patient wearing?: Pull over shirt    Upper body assist Assist Level: Moderate Assistance - Patient 50 - 74%    Lower Body Dressing/Undressing Lower body dressing      What is the patient wearing?: Incontinence brief,Pants     Lower body assist Assist for lower body dressing: Minimal Assistance - Patient > 75%     Toileting Toileting    Toileting assist Assist for toileting: Minimal Assistance - Patient > 75% (toilet level)     Transfers Chair/bed transfer  Transfers assist     Chair/bed transfer assist level: Minimal Assistance - Patient > 75%     Locomotion Ambulation   Ambulation assist   Ambulation activity did not occur: Safety/medical concerns  Assist level: Contact Guard/Touching assist Assistive device: Walker-rolling Max distance: 335 ft   Walk 10 feet activity   Assist  Walk 10 feet activity did not occur: Safety/medical concerns  Assist level: Contact Guard/Touching assist Assistive device: Walker-rolling   Walk 50 feet activity   Assist Walk 50 feet with 2 turns activity did not occur: Safety/medical  concerns  Assist level: Minimal Assistance - Patient > 75% Assistive device: Walker-rolling    Walk 150 feet activity   Assist Walk 150 feet activity did not occur: Safety/medical concerns  Assist level: Contact Guard/Touching assist Assistive device: Walker-rolling    Walk 10 feet on uneven surface  activity   Assist Walk 10 feet on uneven surfaces activity did not occur: Safety/medical concerns         Wheelchair     Assist Will patient use wheelchair at discharge?: No (Per PT long term goals)             Wheelchair 50 feet with 2 turns activity    Assist            Wheelchair 150  feet activity     Assist          Blood pressure 109/71, pulse 63, temperature 97.7 F (36.5 C), temperature source Oral, resp. rate 20, SpO2 95 %.     Medical Problem List and Plan: 1.  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             -patient may shower             -ELOS/Goals:4/27 /Supervision , discussed d/c date with pt              con't PT, OT and SLP 2.  Antithrombotics: -DVT/anticoagulation:  Pharmaceutical: Lovenox             -antiplatelet therapy: DAPT X 3 weeks followed by ASA alone.  3. Lumbar stenosis/Pain Management: Denies back pain. N/A 4. Mood: LCSW to follow for evaluation and support.              -antipsychotic agents: N/A 5. Neuropsych: This patient is ?not fully capable of making decisions on his own behalf. 6. Skin/Wound Care: Routine pressure relief measures 7. Fluids/Electrolytes/Nutrition: Monitor I/Os             CMP ordered for Monday 8. Dyslipidemia              High dose statin. 9. HTN: Monitor BP TID             Norvasc increase to 10mg             4/11- BP controlled- looks better- con't regimen Vitals:   04/11/21 1936 04/12/21 0333  BP: 109/81 109/71  Pulse: 70 63  Resp: 20 20  Temp: (!) 97.5 F (36.4 C) 97.7 F (36.5 C)  SpO2: 97% 95%  controlled 4/13 10. Thrombocytopenia             Plts 141 on 4/6, labs ordered for Monday  4/11- Plts 155k today- is better 11.  Wet voice -appreciate SLP eval 12. CKD? Vs AKI  4/11- Cr up to 1.49- and BUN up to 27 from 18- will push fluids and if doesn't improve, might need IVFs. I 6/11 yesterday  Will start IVF at noc   LOS: 6 days A FACE TO FACE EVALUATION WAS PERFORMED  04/12/2021, 7:28 AM

## 2021-04-12 NOTE — Progress Notes (Signed)
Speech Language Pathology Daily Session Note  Patient Details  Name: Vedanth Sirico MRN: [id number] Date of Birth: 28-Aug-1936  Today's Date: 04/12/2021 SLP Individual Time: 7867-6720 SLP Individual Time Calculation (min): 57 min  Short Term Goals: Week 1: SLP Short Term Goal 1 (Week 1): Patient will demonstrate intellectual awareness of current deficits in cognition, vocal intensity and vocal quality with modA. SLP Short Term Goal 2 (Week 1): Patient will tolerate current diet regular/thin liquids with min A cues for use of trained strategies (intermittent throat clear, alternate solids and liquids, small bites/sips). SLP Short Term Goal 3 (Week 1): Patient will demonstrate functional problem solving for basic and familair tasks with Mod verbal cues. SLP Short Term Goal 4 (Week 1): Patient will utilize external memory aids for orientation to place, date and situation with Min verbal and visual cues. SLP Short Term Goal 5 (Week 1): Pt will utilize compensatory strategies as trained to increase vocal intensity and monitor vocal quality provided mod A verbal cues  Skilled Therapeutic Interventions:   Skilled SLP intervention focused on cognition. Pt oriented to date and day of the week using monthly calendar. He competed functional problem solving task with money calculations with mod-max A for understanding and adding coin amounts. Pt identified 2 physical limitations during conversation but required moderate direction and verbal reasoning from SLP. Cont with therapy per plan of care.    Pain Pain Assessment Pain Scale: Faces Pain Score: 0-No pain Faces Pain Scale: No hurt  Therapy/Group: Individual Therapy  Carlean Jews Malyk Girouard 04/12/2021, 12:00 PM

## 2021-04-12 NOTE — Progress Notes (Signed)
Occupational Therapy Session Note  Patient Details  Name: Maxwell Rollins MRN: [id number] Date of Birth: 05-04-1936  Today's Date: 04/12/2021 OT Individual Time: 8592-9244 OT Individual Time Calculation (min): 40 min    Short Term Goals: Week 1:  OT Short Term Goal 1 (Week 1): Pt will be able to rise to stand with mod A of 1 to prep for LB self care. OT Short Term Goal 2 (Week 1): Pt will be able to stand with mod A of 1 while pulling pants over hips with LB dressing. OT Short Term Goal 3 (Week 1): Pt will be able to transfer on and off the toilet with mod A of 1.  Skilled Therapeutic Interventions/Progress Updates:    Pt received seated in recliner with NT present, denies pain, agreeable to therapy. STS throughout session with CGA and use of bed rail/grab bar to pull forward. Cont to demonstrate difficulty with STS when using split hand technique for RW. Seated at sink, completed grooming with S, assisted washed hair with hair washing tray. Noted difficulty following instructions to hold tray. Doffed shirt close S, donned new shirt with min A for orientation. Toilet transfer with CGA and use of grab bar, CGA for LB clothing management and S for seated posterior pericare. Cont void of b/b. Return to recliner same manner as above.   Pt left in recliner with chair alarm engaged, telesitter on, call bell in reach, and all immediate needs met.    Therapy Documentation Precautions:  Precautions Precautions: Fall,Other (comment) Precaution Comments: Monitor BP. Posterior lean Restrictions Weight Bearing Restrictions: No Pain: Pain Assessment Pain Scale: Faces Faces Pain Scale: No hurt ADL: See Care Tool for more details.  Therapy/Group: Individual Therapy  Volanda Napoleon MS, OTR/L  04/12/2021, 2:40 PM

## 2021-04-13 ENCOUNTER — Other Ambulatory Visit: Payer: Self-pay

## 2021-04-13 NOTE — Progress Notes (Signed)
PROGRESS NOTE   Subjective/Complaints:  Oriented to person and place but not time , no breathing issues or cough   ROS- Pt denies SOB, abd pain, CP, N/V   Objective:   No results found. No results for input(s): WBC, HGB, HCT, PLT in the last 72 hours. No results for input(s): NA, K, CL, CO2, GLUCOSE, BUN, CREATININE, CALCIUM in the last 72 hours.  Intake/Output Summary (Last 24 hours) at 04/13/2021 0813 Last data filed at 04/13/2021 0631 Gross per 24 hour  Intake 540 ml  Output 650 ml  Net -110 ml        Physical Exam: Vital Signs Blood pressure 131/80, pulse 61, temperature 98.2 F (36.8 C), temperature source Oral, resp. rate 20, height 6\' 2"  (1.88 m), weight 90.6 kg, SpO2 96 %.  General: No acute distress Mood and affect are appropriate Heart: Regular rate and rhythm no rubs murmurs or extra sounds Lungs: Clear to auscultation, breathing unlabored, no rales or wheezes Abdomen: Positive bowel sounds, soft nontender to palpation, nondistended Extremities: No clubbing, cyanosis, or edema Skin: No evidence of breakdown, no evidence of rash  Neurologic: Cranial nerves II through XII intact, motor strength is 5/5 in bilateral deltoid, bicep, tricep, grip, hip flexor, knee extensors, ankle dorsiflexor and plantar flexor Oriented to person , day  Musculoskeletal: Full range of motion in all 4 extremities. No joint swelling   Assessment/Plan: 1. Functional deficits which require 3+ hours per day of interdisciplinary therapy in a comprehensive inpatient rehab setting.  Physiatrist is providing close team supervision and 24 hour management of active medical problems listed below.  Physiatrist and rehab team continue to assess barriers to discharge/monitor patient progress toward functional and medical goals  Care Tool:  Bathing    Body parts bathed by patient: Chest,Abdomen,Face,Left arm,Right arm   Body parts  bathed by helper: Front perineal area,Buttocks,Right upper leg,Left upper leg,Right lower leg,Left lower leg     Bathing assist Assist Level: Moderate Assistance - Patient 50 - 74%     Upper Body Dressing/Undressing Upper body dressing   What is the patient wearing?: Pull over shirt    Upper body assist Assist Level: Minimal Assistance - Patient > 75%    Lower Body Dressing/Undressing Lower body dressing      What is the patient wearing?: Incontinence brief,Pants     Lower body assist Assist for lower body dressing: Minimal Assistance - Patient > 75%     Toileting Toileting    Toileting assist Assist for toileting: Maximal Assistance - Patient 25 - 49%     Transfers Chair/bed transfer  Transfers assist     Chair/bed transfer assist level: Contact Guard/Touching assist     Locomotion Ambulation   Ambulation assist   Ambulation activity did not occur: Safety/medical concerns  Assist level: Minimal Assistance - Patient > 75% Assistive device: Walker-rolling Max distance: 10'   Walk 10 feet activity   Assist  Walk 10 feet activity did not occur: Safety/medical concerns  Assist level: Contact Guard/Touching assist Assistive device: Walker-rolling   Walk 50 feet activity   Assist Walk 50 feet with 2 turns activity did not occur: Safety/medical concerns  Assist level: Minimal  Assistance - Patient > 75% Assistive device: Walker-rolling    Walk 150 feet activity   Assist Walk 150 feet activity did not occur: Safety/medical concerns  Assist level: Contact Guard/Touching assist Assistive device: Walker-rolling    Walk 10 feet on uneven surface  activity   Assist Walk 10 feet on uneven surfaces activity did not occur: Safety/medical concerns         Wheelchair     Assist Will patient use wheelchair at discharge?: No (Per PT long term goals)             Wheelchair 50 feet with 2 turns activity    Assist             Wheelchair 150 feet activity     Assist          Blood pressure 131/80, pulse 61, temperature 98.2 F (36.8 C), temperature source Oral, resp. rate 20, height 6\' 2"  (1.88 m), weight 90.6 kg, SpO2 96 %.     Medical Problem List and Plan: 1.  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             -patient may shower             -ELOS/Goals:4/27 /Supervision ,              con't PT, OT and SLP 2.  Antithrombotics: -DVT/anticoagulation:  Pharmaceutical: Lovenox             -antiplatelet therapy: DAPT X 3 weeks followed by ASA alone.  3. Lumbar stenosis/Pain Management: Denies back pain. N/A 4. Mood: LCSW to follow for evaluation and support.              -antipsychotic agents: N/A 5. Neuropsych: This patient is ?not fully capable of making decisions on his own behalf. 6. Skin/Wound Care: Routine pressure relief measures 7. Fluids/Electrolytes/Nutrition: Monitor I/Os             CMP ordered for Monday 8. Dyslipidemia              High dose statin. 9. HTN: Monitor BP TID             Norvasc increase to 10mg             4/11- BP controlled- looks better- con't regimen Vitals:   04/12/21 1939 04/13/21 0502  BP: (!) 95/46 131/80  Pulse: 70 61  Resp: 20 20  Temp: 98.4 F (36.9 C) 98.2 F (36.8 C)  SpO2: 96% 96%  controlled 4/15 monitor for hypotension  10. Thrombocytopenia             Plts 141 on 4/6, labs ordered for Monday  4/11- Plts 155k today- is better 11.  Wet voice -appreciate SLP eval 12. CKD? Vs AKI  4/11- Cr up to 1.49- and BUN up to 27 from 18-  Will start IVF at noc- don't see IV pole in room will discuss with nsg   LOS: 7 days A FACE TO FACE EVALUATION WAS PERFORMED  6/11 04/13/2021, 8:13 AM

## 2021-04-13 NOTE — Progress Notes (Signed)
Occupational Therapy Session Note  Patient Details  Name: Maxwell Rollins MRN: [id number] Date of Birth: Oct 24, 1936  Today's Date: 04/13/2021 OT Individual Time: 2224-1146 OT Individual Time Calculation (min): 53 min    Short Term Goals: Week 1:  OT Short Term Goal 1 (Week 1): Pt will be able to rise to stand with mod A of 1 to prep for LB self care. OT Short Term Goal 2 (Week 1): Pt will be able to stand with mod A of 1 while pulling pants over hips with LB dressing. OT Short Term Goal 3 (Week 1): Pt will be able to transfer on and off the toilet with mod A of 1.  Skilled Therapeutic Interventions/Progress Updates:    Pt received seated in recliner with NT present, denies pain, agreeable to therapy. Session focus on dynamic standing balance, activity tolerance, and functional mobility in prep for improved ADL performance. STS with use of bed rail with CGA , amb to toilet with min A for balance with RW. Great difficulty turning with RW to the L, able to turn widely to the R with max VCs to stay inside walk. Still heavily reliant on R grab bar. Cont void of b/b, close S for seated pericare and CGA for clothing management. W/c transport to and from gym 2/2 time management. Massed practiced of STS from high and low heights, with and without AD. Pt able to progress from min A to close S. Additionally practiced side stepping up and down map with RW, improved with practice, but cont to demonstrate great difficulty with safe RW use. Completed dynamic balance activity with CGA, pt able to pick up objects from floor with CGA and UE support, however, noted difficulty with following instructions for task. Finally, completed 6 min on Nustep at level 6 resistance. Amb transfer back to recliner same manner as above.   Pt left  In recliner with telesitter on, safety belt alarm engaged, call bell in reach, and all immediate needs met.    Therapy Documentation Precautions:  Precautions Precautions:  Fall,Other (comment) Precaution Comments: Monitor BP. Posterior lean Restrictions Weight Bearing Restrictions: No Pain:   denies ADL: See Care Tool for more details.   Therapy/Group: Individual Therapy  Volanda Napoleon MS, OTR/L  04/13/2021, 12:31 PM

## 2021-04-13 NOTE — Progress Notes (Signed)
Occupational Therapy Session Note  Patient Details  Name: Maxwell Rollins MRN: [id number] Date of Birth: September 16, 1936  Today's Date: 04/13/2021 OT Individual Time: 0930-1015 OT Individual Time Calculation (min): 45 min    Short Term Goals: Week 1:  OT Short Term Goal 1 (Week 1): Pt will be able to rise to stand with mod A of 1 to prep for LB self care. OT Short Term Goal 2 (Week 1): Pt will be able to stand with mod A of 1 while pulling pants over hips with LB dressing. OT Short Term Goal 3 (Week 1): Pt will be able to transfer on and off the toilet with mod A of 1.  Skilled Therapeutic Interventions/Progress Updates:    1;1. Pt received in bed agreeable to OT. Pt declines bathing but agreeable to don pants and sit at sink for grooming. Pt requires A to thread 1LE, but able to thread second. Pt demo poor dual task throughotu needing to occasionally stop ADL activity to carry on conversation. Pt sit to stand at EOB with MOD A for power up with manual facilitaiton of hand placement on RW and MIN A to steady during ambulation to sink and later to return to recliner at end of session. Pt seated grooming with increased time for pt to self organize/locate items. Pt attempts to put poligrip on toothbrush and requires A to notice. Pt completes static standing at tabletop while completing graded pipe tree activity beginning with skilled fading of question to direct vebal/visual cues for first half of figure. As cuing faded away pt begins to build completely different from photo and unable to notice difference/errors without direct cuing. Exited session with pt seated in recliner, exit alarm on and call light in reach  Therapy Documentation Precautions:  Precautions Precautions: Fall,Other (comment) Precaution Comments: Monitor BP. Posterior lean Restrictions Weight Bearing Restrictions: No General:   Vital Signs: Therapy Vitals Temp: 98.2 F (36.8 C) Temp Source: Oral Pulse Rate: 61 Resp:  20 BP: 131/80 Patient Position (if appropriate): Lying Oxygen Therapy SpO2: 96 % O2 Device: Room Air Pain:   ADL: ADL Eating: Set up Grooming: Supervision/safety Upper Body Bathing: Supervision/safety,Moderate cueing Where Assessed-Upper Body Bathing: Sitting at sink Lower Body Bathing: Maximal assistance Where Assessed-Lower Body Bathing: Sitting at sink,Standing at sink Upper Body Dressing: Maximal assistance Where Assessed-Upper Body Dressing: Wheelchair Lower Body Dressing: Dependent Where Assessed-Lower Body Dressing: Wheelchair Toileting: Dependent Where Assessed-Toileting: Bedside Commode Toilet Transfer: Other (comment) (stedy) Vision   Perception    Praxis   Exercises:   Other Treatments:     Therapy/Group: Individual Therapy  Shon Hale 04/13/2021, 6:46 AM

## 2021-04-13 NOTE — Progress Notes (Signed)
Physical Therapy Session Note  Patient Details  Name: Maxwell Rollins MRN: [id number] Date of Birth: 1936-12-20  Today's Date: 04/13/2021 PT Group Time: 1255-1400 PT Group Time Calculation (min): 65 min  Short Term Goals: Week 1:  PT Short Term Goal 1 (Week 1): Pt will perform stand transfer from bed to chair with min assist. PT Short Term Goal 2 (Week 1): Pt will perform sit to stand transfer with min assist using LRAD. PT Short Term Goal 3 (Week 1): Pt will ambulate 35f with min assist using LRAD. PT Short Term Goal 4 (Week 1): Pt will perform bed mobility with mod assist.  Skilled Therapeutic Interventions/Progress Updates:   Pt received sitting in WC and agreeable to PT. Pt transported to day room for group therapy circuit  training:   Gait training with RW to weave through 4 cones x2 with no AD and min assist. Pt then performed gait training over 4 canes on floor with min assist. Improved step length compared to previous sessions. Step/down up 4 inch step with min assist Cues for improved safety with AD management, step length L and midline orientation.   Seated UE therex x 5 min total to perform tball lift and trunk rotation x 1 min each, toss and catch, with cues for technique and increased posture and ROM.  Nustep activity tolerance training BLE only x 5 minutes, level 5 with thigh support bar for safety. Pt noted to take several short rest breaks throughout due to fatigue.   Kinetron BLE reciprocal movement training x 5 min, 30cm/sec. Assist intermittently to improve position of the LLE into neutral ER/IR and improve activation of gluteals.   Sit<>stand and stand pivot transfer with min assist throughout and RUE supported on arm rest for safety.   Therapeutic rest breaks needed between stations due to fatigue. [facility] and all needs met.       Therapy Documentation Precautions:  Precautions Precautions:  Fall,Other (comment) Precaution Comments: Monitor BP. Posterior lean Restrictions Weight Bearing Restrictions: No    Vital Signs: Therapy Vitals Temp: 98.7 F (37.1 C) Pulse Rate: 67 Resp: 18 BP: 105/66 Patient Position (if appropriate): Sitting Oxygen Therapy SpO2: 99 % O2 Device: Room Air Pain: denies   Therapy/Group: Group Therapy  ALorie Phenix4/15/2022, 4:06 PM

## 2021-04-13 NOTE — Progress Notes (Signed)
Speech Language Pathology Weekly Progress and Session Note  Patient Details  Name: Maxwell Rollins MRN: [id number] Date of Birth: 01-06-1936  Beginning of progress report period: April 06, 2021 End of progress report period: April 13, 2021  Today's Date: 04/13/2021 SLP Individual Time: 1430-1500 SLP Individual Time Calculation (min): 30 min  Short Term Goals: Week 1: SLP Short Term Goal 1 (Week 1): Patient will demonstrate intellectual awareness of current deficits in cognition, vocal intensity and vocal quality with modA. SLP Short Term Goal 1 - Progress (Week 1): Met SLP Short Term Goal 2 (Week 1): Patient will tolerate current diet regular/thin liquids with min A cues for use of trained strategies (intermittent throat clear, alternate solids and liquids, small bites/sips). SLP Short Term Goal 2 - Progress (Week 1): Met SLP Short Term Goal 3 (Week 1): Patient will demonstrate functional problem solving for basic and familair tasks with Mod verbal cues. SLP Short Term Goal 3 - Progress (Week 1): Met SLP Short Term Goal 4 (Week 1): Patient will utilize external memory aids for orientation to place, date and situation with Min verbal and visual cues. SLP Short Term Goal 4 - Progress (Week 1): Not met SLP Short Term Goal 5 (Week 1): Pt will utilize compensatory strategies as trained to increase vocal intensity and monitor vocal quality provided mod A verbal cues SLP Short Term Goal 5 - Progress (Week 1): Met    New Short Term Goals: Week 2: SLP Short Term Goal 1 (Week 2): Patient will demonstrate functinal problem solving for basic and familiar tasks with Min verbal cues. SLP Short Term Goal 2 (Week 2): Patient will utilize external memory aids for orientation to place, date and situation with Min verbal and visual cues. SLP Short Term Goal 3 (Week 2): Patient will utilize word-finding strategies at the sentence level with supervision level verbal cues.  Weekly Progress Updates:  Patient has made slow and inconsistent gains and has met 4 of 5 STGs this reporting period. Currently, patient is consuming regular textures with thin liquids with minimal overt s/s of aspiration and overall Mod I for use of swallowing compensatory strategies. Despite a hoarse vocal quality, patient is overall 100% intelligible at the conversation level. Patient continues to demonstrate fluctuating level of cognitive functioning and requires overall Mod A verbal cues for functional problem solving and intellectual awareness for basic and familiar tasks. Both the patient and his daughter endorse high-level word-finding difficulty, therefore, will add goal. Patient and family education ongoing. Patient would benefit from continued skilled SLP intervention to maximize his cognitive-linguistic functioning and overall functional independence prior to discharge.      Intensity: Minumum of 1-2 x/day, 30 to 90 minutes Frequency: 3 to 5 out of 7 days Duration/Length of Stay: 04/25/21 Treatment/Interventions: Cognitive remediation/compensation;Cueing hierarchy;Functional tasks;Therapeutic Activities;Therapeutic Exercise;Dysphagia/aspiration precaution training;Medication managment;Patient/family education;Speech/Language facilitation;Internal/external aids;Environmental controls   Daily Session  Skilled Therapeutic Interventions:  Skilled treatment session focused on cognitive goals. Upon arrival, patient's daughter was present. SLP facilitated functional conversation about PLOF prior to admission which led to discussions of medication management. Patient required Mod verbal cues for verbal problem solving and reasoning regarding safety with medications and required total A for recall of his current medications. The patient and his daughter report that he would like to manage his own medications at discharge, therefore, suggest organizing a pill box during next session. Patient left upright in recliner with alarm  on and daughter present. Continue with current plan of care.   Pain No/Denies Pain  Therapy/Group: Individual Therapy  Maxwell Rollins 04/13/2021, 3:32 PM

## 2021-04-14 LAB — URINALYSIS, ROUTINE W REFLEX MICROSCOPIC
Bilirubin Urine: NEGATIVE
Glucose, UA: NEGATIVE mg/dL
Hgb urine dipstick: NEGATIVE
Ketones, ur: NEGATIVE mg/dL
Leukocytes,Ua: NEGATIVE
Nitrite: NEGATIVE
Protein, ur: NEGATIVE mg/dL
Specific Gravity, Urine: 1.017 (ref 1.005–1.030)
pH: 6 (ref 5.0–8.0)

## 2021-04-14 NOTE — Progress Notes (Signed)
Occupational Therapy Weekly Progress Note  Patient Details  Name: Maxwell Rollins MRN: [id number] Date of Birth: [birth year]  Beginning of progress report period: April 07, 2021 End of progress report period: April 14, 2021  Patient has met 3 of 3 short term goals.  Pt has made significant progress this reporting period. Pt has demonstrated improved activity tolerance, dynamic standing balance, and functional strength and safety awareness to complete functional transfers and ADL at overall min A level. Pt performs functional transfers with more ease with use of pulling up on grab bar/bed rail vs RW. Cont to demonstrate intermittent confusion, difficulty turning with the RW/safe RW use, problem solving, and perseveration of tasks at times.   Patient continues to demonstrate the following deficits: muscle weakness, decreased cardiorespiratoy endurance, impaired timing and sequencing, unbalanced muscle activation, decreased coordination and decreased motor planning, decreased attention, decreased awareness, decreased problem solving, decreased safety awareness, decreased memory and delayed processing and decreased standing balance, decreased postural control and decreased balance strategies and therefore will continue to benefit from skilled OT intervention to enhance overall performance with BADL and Reduce care partner burden.  Patient progressing toward long term goals..  Continue plan of care.  OT Short Term Goals Week 2:  OT Short Term Goal 1 (Week 2): STG = LTG 2/2 ELOS   Therapy Documentation Precautions:  Precautions Precautions: Fall,Other (comment) Precaution Comments: Monitor BP. Posterior lean Restrictions Weight Bearing Restrictions: No   Volanda Napoleon MS, OTR/L  04/14/2021, 2:42 PM

## 2021-04-14 NOTE — Progress Notes (Signed)
PROGRESS NOTE   Subjective/Complaints: Daughter called saying patient was confused overnight- likely sundowning. Will order UA/UC to check for UTI No complaints this morning. Denies pain, constipation, insomnia.    ROS- Pt denies SOB, abd pain, CP, N/V   Objective:   No results found. No results for input(s): WBC, HGB, HCT, PLT in the last 72 hours. No results for input(s): NA, K, CL, CO2, GLUCOSE, BUN, CREATININE, CALCIUM in the last 72 hours.  Intake/Output Summary (Last 24 hours) at 04/14/2021 0851 Last data filed at 04/14/2021 0820 Gross per 24 hour  Intake 1609.91 ml  Output --  Net 1609.91 ml        Physical Exam: Vital Signs Blood pressure 123/86, pulse 67, temperature 98 F (36.7 C), temperature source Oral, resp. rate 18, height 6\' 2"  (1.88 m), weight 90.6 kg, SpO2 97 %. Gen: no distress, normal appearing HEENT: oral mucosa pink and moist, NCAT Cardio: Reg rate Chest: normal effort, normal rate of breathing Abd: soft, non-distended Ext: no edema Psych: pleasant, normal affect Skin: intact  Neurologic: Cranial nerves II through XII intact, motor strength is 5/5 in bilateral deltoid, bicep, tricep, grip, hip flexor, knee extensors, ankle dorsiflexor and plantar flexor Oriented to person , day  Musculoskeletal: Full range of motion in all 4 extremities. No joint swelling   Assessment/Plan: 1. Functional deficits which require 3+ hours per day of interdisciplinary therapy in a comprehensive inpatient rehab setting.  Physiatrist is providing close team supervision and 24 hour management of active medical problems listed below.  Physiatrist and rehab team continue to assess barriers to discharge/monitor patient progress toward functional and medical goals  Care Tool:  Bathing    Body parts bathed by patient: Chest,Abdomen,Face,Left arm,Right arm   Body parts bathed by helper: Front perineal  area,Buttocks,Right upper leg,Left upper leg,Right lower leg,Left lower leg     Bathing assist Assist Level: Moderate Assistance - Patient 50 - 74%     Upper Body Dressing/Undressing Upper body dressing   What is the patient wearing?: Pull over shirt    Upper body assist Assist Level: Supervision/Verbal cueing    Lower Body Dressing/Undressing Lower body dressing      What is the patient wearing?: Incontinence brief,Pants     Lower body assist Assist for lower body dressing: Minimal Assistance - Patient > 75%     Toileting Toileting    Toileting assist Assist for toileting: Contact Guard/Touching assist     Transfers Chair/bed transfer  Transfers assist     Chair/bed transfer assist level: Contact Guard/Touching assist     Locomotion Ambulation   Ambulation assist   Ambulation activity did not occur: Safety/medical concerns  Assist level: Minimal Assistance - Patient > 75% Assistive device: Walker-rolling Max distance: 10'   Walk 10 feet activity   Assist  Walk 10 feet activity did not occur: Safety/medical concerns  Assist level: Contact Guard/Touching assist Assistive device: Walker-rolling   Walk 50 feet activity   Assist Walk 50 feet with 2 turns activity did not occur: Safety/medical concerns  Assist level: Minimal Assistance - Patient > 75% Assistive device: Walker-rolling    Walk 150 feet activity   Assist Walk 150  feet activity did not occur: Safety/medical concerns  Assist level: Contact Guard/Touching assist Assistive device: Walker-rolling    Walk 10 feet on uneven surface  activity   Assist Walk 10 feet on uneven surfaces activity did not occur: Safety/medical concerns         Wheelchair     Assist Will patient use wheelchair at discharge?: No (Per PT long term goals)             Wheelchair 50 feet with 2 turns activity    Assist            Wheelchair 150 feet activity     Assist           Blood pressure 123/86, pulse 67, temperature 98 F (36.7 C), temperature source Oral, resp. rate 18, height 6\' 2"  (1.88 m), weight 90.6 kg, SpO2 97 %.     Medical Problem List and Plan: 1.  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             -patient may shower             -ELOS/Goals:4/27 ,              Continue PT, OT and SLP 2.  Antithrombotics: -DVT/anticoagulation:  Pharmaceutical: Lovenox             -antiplatelet therapy: DAPT X 3 weeks followed by ASA alone.  3. Lumbar stenosis/Pain Management: Denies back pain. N/A 4. Mood: LCSW to follow for evaluation and support.              -antipsychotic agents: N/A 5. Neuropsych: This patient is ?not fully capable of making decisions on his own behalf. 6. Skin/Wound Care: Routine pressure relief measures 7. Fluids/Electrolytes/Nutrition: Monitor I/Os             CMP ordered for Monday 8. Dyslipidemia              High dose statin. 9. HTN: Monitor BP TID             Norvasc increase to 10mg             4/16- BP controlled- looks better- continue regimen Vitals:   04/13/21 1920 04/14/21 0339  BP: 105/66 123/86  Pulse: 69 67  Resp: 17 18  Temp: 98.1 F (36.7 C) 98 F (36.7 C)  SpO2: 98% 97%   10. Thrombocytopenia             Plts 141 on 4/6, labs ordered for Monday  4/11- Plts 155k today- is better 11.  Wet voice -appreciate SLP eval 12. CKD? Vs AKI  4/11- Cr up to 1.49- and BUN up to 27 from 18- continue IVF. Encouraged drinking water during the day.  13. Delirium: likely sundowning. Check UA/UC for UTI   LOS: 8 days A FACE TO FACE EVALUATION WAS PERFORMED  6/11 Joleah Kosak 04/14/2021, 8:51 AM

## 2021-04-14 NOTE — Plan of Care (Signed)
  Problem: RH BLADDER ELIMINATION Goal: RH STG MANAGE BLADDER WITH ASSISTANCE Description: STG Manage Bladder With  no Assistance Outcome: Not Progressing; incontinence   Problem: RH SAFETY Goal: RH STG ADHERE TO SAFETY PRECAUTIONS W/ASSISTANCE/DEVICE Description: STG Adhere to Safety Precautions With cues/reminders Assistance/Device. Outcome: Not Progressing; telesitter   Problem: RH KNOWLEDGE DEFICIT Goal: RH STG INCREASE KNOWLEDGE OF DIABETES Description: Patient, wife and dtr will be able to manage DM with medications and dietary modifications using handouts and educational needs independently Outcome: Not Progressing; confused

## 2021-04-15 LAB — URINE CULTURE: Culture: <10000 — AB

## 2021-04-15 NOTE — Progress Notes (Signed)
Occupational Therapy Session Note  Patient Details  Name: Maxwell Rollins MRN: [id number] Date of Birth: Aug 27, 1936  Today's Date: 04/15/2021 OT Individual Time: 0228-4069 OT Individual Time Calculation (min): 60 min    Short Term Goals: Week 2:  OT Short Term Goal 1 (Week 2): STG = LTG 2/2 ELOS  Skilled Therapeutic Interventions/Progress Updates:    Pt received supine with NT assisting with bed change d/t incontinence. Pt AxO x4. No c/o pain throughout session. Pt completed bed mobility with min A for trunk elevation. Min A for sit > stand from EOB with cueing for hand placement using RW. Stand pivot transfer to the w/c with mod A d/t posterior lean especially when stepping backward to chair. UB bathing with set up assist. Min A to don shirt d/t R arm getting stuck in head hole. Pt completed sit > stands from w/c with CGA for LB ADLs. Min A to wash peri areas and min A to don pants. Pt then required mod A to don socks and shoes, especially to reach RLE. Pt completed 150 ft of functional mobility at CGA level overall using the RW. He had a very unsafe and impulsive stand > sit on the mat, narrowly avoiding the side edge. Had pt re-do this stand > sit to reinforce proper safe technique which he was able to do with mod cueing. Pt has most trouble with sequencing reaching back to surface he is sitting on. After rest break pt returned to his room in similar fashion. Pt was left sitting up in the recliner with all needs met, chair alarm set.   In w/c 107/81. After ADLs BP 114/77. No s/s of OH.   Therapy Documentation Precautions:  Precautions Precautions: Fall,Other (comment) Precaution Comments: Monitor BP. Posterior lean Restrictions Weight Bearing Restrictions: No   Therapy/Group: Individual Therapy  Curtis Sites 04/15/2021, 6:48 AM

## 2021-04-15 NOTE — Progress Notes (Signed)
PROGRESS NOTE   Subjective/Complaints: UA negative  Performing self care with OT Vital signs stable Incontinent of urine this AM  ROS- Pt denies SOB, abd pain, CP, N/V   Objective:   No results found. No results for input(s): WBC, HGB, HCT, PLT in the last 72 hours. No results for input(s): NA, K, CL, CO2, GLUCOSE, BUN, CREATININE, CALCIUM in the last 72 hours.  Intake/Output Summary (Last 24 hours) at 04/15/2021 1258 Last data filed at 04/15/2021 0800 Gross per 24 hour  Intake 1690.92 ml  Output 1150 ml  Net 540.92 ml        Physical Exam: Vital Signs Blood pressure 114/75, pulse 65, temperature 98.4 F (36.9 C), temperature source Oral, resp. rate 18, height 6\' 2"  (1.88 m), weight 90.6 kg, SpO2 96 %. Gen: no distress, normal appearing HEENT: oral mucosa pink and moist, NCAT Cardio: Reg rate Chest: normal effort, normal rate of breathing Abd: soft, non-distended Ext: no edema Psych: pleasant, normal affect Skin: intact Neurologic: Cranial nerves II through XII intact, motor strength is 5/5 in bilateral deltoid, bicep, tricep, grip, hip flexor, knee extensors, ankle dorsiflexor and plantar flexor Oriented to person , day  Musculoskeletal: Full range of motion in all 4 extremities. No joint swelling   Assessment/Plan: 1. Functional deficits which require 3+ hours per day of interdisciplinary therapy in a comprehensive inpatient rehab setting.  Physiatrist is providing close team supervision and 24 hour management of active medical problems listed below.  Physiatrist and rehab team continue to assess barriers to discharge/monitor patient progress toward functional and medical goals  Care Tool:  Bathing    Body parts bathed by patient: Chest,Abdomen,Face,Left arm,Right arm   Body parts bathed by helper: Front perineal area,Buttocks,Right upper leg,Left upper leg,Right lower leg,Left lower leg     Bathing  assist Assist Level: Moderate Assistance - Patient 50 - 74%     Upper Body Dressing/Undressing Upper body dressing   What is the patient wearing?: Pull over shirt    Upper body assist Assist Level: Supervision/Verbal cueing    Lower Body Dressing/Undressing Lower body dressing      What is the patient wearing?: Incontinence brief,Pants     Lower body assist Assist for lower body dressing: Minimal Assistance - Patient > 75%     Toileting Toileting    Toileting assist Assist for toileting: Contact Guard/Touching assist     Transfers Chair/bed transfer  Transfers assist     Chair/bed transfer assist level: Contact Guard/Touching assist     Locomotion Ambulation   Ambulation assist   Ambulation activity did not occur: Safety/medical concerns  Assist level: Minimal Assistance - Patient > 75% Assistive device: Walker-rolling Max distance: 10'   Walk 10 feet activity   Assist  Walk 10 feet activity did not occur: Safety/medical concerns  Assist level: Contact Guard/Touching assist Assistive device: Walker-rolling   Walk 50 feet activity   Assist Walk 50 feet with 2 turns activity did not occur: Safety/medical concerns  Assist level: Minimal Assistance - Patient > 75% Assistive device: Walker-rolling    Walk 150 feet activity   Assist Walk 150 feet activity did not occur: Safety/medical concerns  Assist level:  Contact Guard/Touching assist Assistive device: Walker-rolling    Walk 10 feet on uneven surface  activity   Assist Walk 10 feet on uneven surfaces activity did not occur: Safety/medical concerns         Wheelchair     Assist Will patient use wheelchair at discharge?: No (Per PT long term goals)             Wheelchair 50 feet with 2 turns activity    Assist            Wheelchair 150 feet activity     Assist          Blood pressure 114/75, pulse 65, temperature 98.4 F (36.9 C), temperature source  Oral, resp. rate 18, height 6\' 2"  (1.88 m), weight 90.6 kg, SpO2 96 %.     Medical Problem List and Plan: 1.  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             -patient may shower             -ELOS/Goals:4/27 ,              Continue PT, OT and SLP 2.  Impaired mobility -DVT/anticoagulation:  Pharmaceutical: Continue Lovenox             -antiplatelet therapy: DAPT X 3 weeks followed by ASA alone.  3. Lumbar stenosis/Pain Management: Denies back pain. N/A 4. Mood: LCSW to follow for evaluation and support.              -antipsychotic agents: N/A 5. Neuropsych: This patient is ?not fully capable of making decisions on his own behalf. 6. Skin/Wound Care: Routine pressure relief measures 7. Fluids/Electrolytes/Nutrition: Monitor I/Os             CMP ordered for Monday 8. Dyslipidemia              High dose statin. 9. HTN: Monitor BP TID             Norvasc increase to 10mg             4/17- BP controlled- looks better- continue regimen Vitals:   04/14/21 2116 04/15/21 0346  BP: 114/80 114/75  Pulse: 75 65  Resp: 15 18  Temp: (!) 97.4 F (36.3 C) 98.4 F (36.9 C)  SpO2: 96% 96%   10. Thrombocytopenia             Plts 141 on 4/6, labs ordered for Monday  4/11- Plts 155k today- is better 11.  Wet voice -appreciate SLP eval 12. CKD? Vs AKI  4/11- Cr up to 1.49- and BUN up to 27 from 18- continue IVF. Encouraged drinking water during the day.  13. Delirium: likely sundowning. UA reviewed and negative   LOS: 9 days A FACE TO FACE EVALUATION WAS PERFORMED  6/11 Seema Blum 04/15/2021, 12:58 PM

## 2021-04-16 LAB — BASIC METABOLIC PANEL
Anion gap: 7 (ref 5–15)
BUN: 18 mg/dL (ref 8–23)
CO2: 27 mmol/L (ref 22–32)
Calcium: 8.9 mg/dL (ref 8.9–10.3)
Chloride: 103 mmol/L (ref 98–111)
Creatinine, Ser: 1.4 mg/dL — ABNORMAL HIGH (ref 0.61–1.24)
GFR, Estimated: 50 mL/min — ABNORMAL LOW (ref >60)
Glucose, Bld: 96 mg/dL (ref 70–99)
Potassium: 3.9 mmol/L (ref 3.5–5.1)
Sodium: 137 mmol/L (ref 135–145)

## 2021-04-16 LAB — CBC
HCT: 40.9 % (ref 39.0–52.0)
Hemoglobin: 13.6 g/dL (ref 13.0–17.0)
MCH: 29.2 pg (ref 26.0–34.0)
MCHC: 33.3 g/dL (ref 30.0–36.0)
MCV: 87.8 fL (ref 80.0–100.0)
Platelets: 177 10*3/uL (ref 150–400)
RBC: 4.66 MIL/uL (ref 4.22–5.81)
RDW: 14.7 % (ref 11.5–15.5)
WBC: 7.9 10*3/uL (ref 4.0–10.5)
nRBC: 0 % (ref 0.0–0.2)

## 2021-04-16 NOTE — Progress Notes (Signed)
Speech Language Pathology Daily Session Note  Patient Details  Name: Maxwell Rollins MRN: [id number] Date of Birth: [birth year]  Today's Date: 04/16/2021 SLP Individual Time: 0815-0900 SLP Individual Time Calculation (min): 45 min  Short Term Goals: Week 2: SLP Short Term Goal 1 (Week 2): Patient will demonstrate functinal problem solving for basic and familiar tasks with Min verbal cues. SLP Short Term Goal 2 (Week 2): Patient will utilize external memory aids for orientation to place, date and situation with Min verbal and visual cues. SLP Short Term Goal 3 (Week 2): Patient will utilize word-finding strategies at the sentence level with supervision level verbal cues.  Skilled Therapeutic Interventions: Skilled SLP intervention focused on cognition. Pt demonstrated use of call button independently for requesting assistance from nursing. He completed higher level word finding task by using details to describe words with mod A. Pt increased accuracy with direction on descriptions. Immediate recall memory retention task completed with max A for use of repetition as recall strategy. Cont with therapy per plan of care.       Pain Pain Assessment Pain Scale: Faces Faces Pain Scale: No hurt  Therapy/Group: Individual Therapy  Carlean Jews Jahlani Lorentz 04/16/2021, 8:50 AM

## 2021-04-16 NOTE — Progress Notes (Signed)
Occupational Therapy Session Note  Patient Details  Name: Maxwell Rollins MRN: [id number] Date of Birth: 09/09/1936  60 min [phone]   Short Term Goals: Week 1:  OT Short Term Goal 1 (Week 1): Pt will be able to rise to stand with mod A of 1 to prep for LB self care. OT Short Term Goal 1 - Progress (Week 1): Met OT Short Term Goal 2 (Week 1): Pt will be able to stand with mod A of 1 while pulling pants over hips with LB dressing. OT Short Term Goal 2 - Progress (Week 1): Met OT Short Term Goal 3 (Week 1): Pt will be able to transfer on and off the toilet with mod A of 1. OT Short Term Goal 3 - Progress (Week 1): Met  Skilled Therapeutic Interventions/Progress Updates:    1;1. Pt received in recliner agreeable to OT. Pt with no c/o dizziness or pain. Pt agreeable to dressing/bathing at sink level. Pt requires MOD A power up with walker sit to stand from w/c and up to MOD A d/t posterior lean in standing/turning to w/c. Pt demo poor motor planning with pivot steps. At sink seated pt grooms with supervision. UB bathing completes with S and shirt changed with set up. Pt sit to stand at sink with MIN A with VC for anterior weight shift at hips to move hips towrads sink when standing for clothing management with MIN A, S for threading BLE and donning slippers. VC for crossing into fig 4 to pull heel up on shoe. Pt completes 2 STS at high low table with small foam wedge under feet to force anterior hip weight shift with mod success with cue "touch your hips to the table. Pt able to shift weight into toes momentarily then lean backwards. Exited session with pt seated in recliner, exit alarm on and call light in reach   Therapy Documentation Precautions:  Precautions Precautions: Fall,Other (comment) Precaution Comments: Monitor BP. Posterior lean Restrictions Weight Bearing Restrictions: No General:   Vital Signs: Therapy Vitals Temp: 98 F (36.7 C) Pulse Rate: 73 Resp: 18 BP: (!)  130/96 Patient Position (if appropriate): Lying Oxygen Therapy SpO2: 97 % O2 Device: Room Air Pain:   ADL: ADL Eating: Set up Grooming: Supervision/safety Upper Body Bathing: Supervision/safety,Moderate cueing Where Assessed-Upper Body Bathing: Sitting at sink Lower Body Bathing: Maximal assistance Where Assessed-Lower Body Bathing: Sitting at sink,Standing at sink Upper Body Dressing: Maximal assistance Where Assessed-Upper Body Dressing: Wheelchair Lower Body Dressing: Dependent Where Assessed-Lower Body Dressing: Wheelchair Toileting: Dependent Where Assessed-Toileting: Bedside Commode Toilet Transfer: Other (comment) (stedy) Vision   Perception    Praxis   Exercises:   Other Treatments:     Therapy/Group: Individual Therapy  Tonny Branch 04/16/2021, 7:25 AM

## 2021-04-16 NOTE — Discharge Instructions (Signed)
Inpatient Rehab Discharge Instructions  Hartman Minahan Discharge date and time:    Activities/Precautions/ Functional Status: Activity: no lifting, driving, or strenuous exercise till cleared for MD Diet: cardiac diet and diabetic diet Wound Care: none needed    Functional status:  ___ No restrictions     ___ Walk up steps independently ___ 24/7 supervision/assistance   ___ Walk up steps with assistance ___ Intermittent supervision/assistance  ___ Bathe/dress independently ___ Walk with walker     ___ Bathe/dress with assistance ___ Walk Independently    ___ Shower independently ___ Walk with assistance    ___ Shower with assistance ___ No alcohol     ___ Return to work/school ________  Special Instructions:     My questions have been answered and I understand these instructions. I will adhere to these goals and the provided educational materials after my discharge from the hospital.  Patient/Caregiver Signature _______________________________ Date __________  Clinician Signature _______________________________________ Date __________  Please bring this form and your medication list with you to all your follow-up doctor's appointments.

## 2021-04-16 NOTE — Progress Notes (Signed)
PROGRESS NOTE   Subjective/Complaints:  No pain c/os , does not remember blood draw this am but has bandage R antecubital   ROS- Pt denies SOB, abd pain, CP, N/V   Objective:   No results found. Recent Labs    04/16/21 0706  WBC 7.9  HGB 13.6  HCT 40.9  PLT 177   No results for input(s): NA, K, CL, CO2, GLUCOSE, BUN, CREATININE, CALCIUM in the last 72 hours.  Intake/Output Summary (Last 24 hours) at 04/16/2021 0754 Last data filed at 04/16/2021 0648 Gross per 24 hour  Intake 1440.21 ml  Output 900 ml  Net 540.21 ml        Physical Exam: Vital Signs Blood pressure (!) 130/96, pulse 73, temperature 98 F (36.7 C), resp. rate 18, height 6\' 2"  (1.88 m), weight 90.6 kg, SpO2 97 %.  General: No acute distress Mood and affect are appropriate Heart: Regular rate and rhythm no rubs murmurs or extra sounds Lungs: Clear to auscultation, breathing unlabored, no rales or wheezes Abdomen: Positive bowel sounds, soft nontender to palpation, nondistended Extremities: No clubbing, cyanosis, or edema Skin: No evidence of breakdown, no evidence of rash Neurologic: Cranial nerves II through XII intact, motor strength is 5/5 in bilateral deltoid, bicep, tricep, grip, hip flexor, knee extensors, ankle dorsiflexor and plantar flexor Oriented to person , day  Musculoskeletal: Full range of motion in all 4 extremities. No joint swelling   Assessment/Plan: 1. Functional deficits which require 3+ hours per day of interdisciplinary therapy in a comprehensive inpatient rehab setting.  Physiatrist is providing close team supervision and 24 hour management of active medical problems listed below.  Physiatrist and rehab team continue to assess barriers to discharge/monitor patient progress toward functional and medical goals  Care Tool:  Bathing    Body parts bathed by patient: Chest,Abdomen,Face,Left arm,Right arm   Body parts  bathed by helper: Front perineal area,Buttocks,Right upper leg,Left upper leg,Right lower leg,Left lower leg     Bathing assist Assist Level: Moderate Assistance - Patient 50 - 74%     Upper Body Dressing/Undressing Upper body dressing   What is the patient wearing?: Pull over shirt    Upper body assist Assist Level: Supervision/Verbal cueing    Lower Body Dressing/Undressing Lower body dressing      What is the patient wearing?: Incontinence brief,Pants     Lower body assist Assist for lower body dressing: Minimal Assistance - Patient > 75%     Toileting Toileting    Toileting assist Assist for toileting: Contact Guard/Touching assist     Transfers Chair/bed transfer  Transfers assist     Chair/bed transfer assist level: Contact Guard/Touching assist     Locomotion Ambulation   Ambulation assist   Ambulation activity did not occur: Safety/medical concerns  Assist level: Minimal Assistance - Patient > 75% Assistive device: Walker-rolling Max distance: 10'   Walk 10 feet activity   Assist  Walk 10 feet activity did not occur: Safety/medical concerns  Assist level: Contact Guard/Touching assist Assistive device: Walker-rolling   Walk 50 feet activity   Assist Walk 50 feet with 2 turns activity did not occur: Safety/medical concerns  Assist level: Minimal Assistance -  Patient > 75% Assistive device: Walker-rolling    Walk 150 feet activity   Assist Walk 150 feet activity did not occur: Safety/medical concerns  Assist level: Contact Guard/Touching assist Assistive device: Walker-rolling    Walk 10 feet on uneven surface  activity   Assist Walk 10 feet on uneven surfaces activity did not occur: Safety/medical concerns         Wheelchair     Assist Will patient use wheelchair at discharge?: No (Per PT long term goals)             Wheelchair 50 feet with 2 turns activity    Assist            Wheelchair 150 feet  activity     Assist          Blood pressure (!) 130/96, pulse 73, temperature 98 F (36.7 C), resp. rate 18, height 6\' 2"  (1.88 m), weight 90.6 kg, SpO2 97 %.     Medical Problem List and Plan: 1.  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             -patient may shower             -ELOS/Goals:4/27 ,              Continue PT, OT and SLP 2.  Impaired mobility -DVT/anticoagulation:  Pharmaceutical: Continue Lovenox             -antiplatelet therapy: DAPT X 3 weeks followed by ASA alone.  3. Lumbar stenosis/Pain Management: Denies back pain. N/A 4. Mood: LCSW to follow for evaluation and support.              -antipsychotic agents: N/A 5. Neuropsych: This patient is ?not fully capable of making decisions on his own behalf. 6. Skin/Wound Care: Routine pressure relief measures 7. Fluids/Electrolytes/Nutrition: Monitor I/Os             CMP ordered for Monday 8. Dyslipidemia              High dose statin. 9. HTN: Monitor BP TID             Norvasc increase to 10mg             4/17- BP controlled- looks better- continue regimen, some lability , no med changes  Vitals:   04/15/21 2018 04/16/21 0350  BP: 108/63 (!) 130/96  Pulse: 73 73  Resp: 18 18  Temp: (!) 97.4 F (36.3 C) 98 F (36.7 C)  SpO2: 98% 97%   10. Thrombocytopenia             Plts 141 on 4/6, labs ordered for Monday  4/11- Plts 155k today- is better 11.  Wet voice -appreciate SLP eval 12. CKD? Vs AKI  4/11- Cr up to 1.49- and BUN up to 27 from 18- continue IVF.Await repeat BMET 13. Delirium: likely sundowning. UA reviewed and negative   LOS: 10 days A FACE TO FACE EVALUATION WAS PERFORMED  6/11 04/16/2021, 7:54 AM

## 2021-04-16 NOTE — Progress Notes (Signed)
Physical Therapy Weekly Progress Note  Patient Details  Name: Maxwell Rollins MRN: [id number] Date of Birth: 1936/03/02  Beginning of progress report period: April 07, 2021 End of progress report period: April 16, 2021  Today's Date: 04/16/2021 PT Individual Time:  1130-1204 and [id number] PT Individual Time Calculation (min): 34 min and 42 min  Patient has met 4 of 4 short term goals.  Pt has made good progress overall with standing balance, improving level of assist required to complete all bed mobility and transfers, and increased activity tolerance as well as overall quality of ambulation. Pt does continue to struggle with some issues of safety awareness and demonstrates variable awareness of impairments.   Patient continues to demonstrate the following deficits muscle weakness, decreased cardiorespiratoy endurance, unbalanced muscle activation and decreased coordination, decreased midline orientation, decreased problem solving, decreased safety awareness and decreased memory and decreased standing balance and decreased balance strategies and therefore will continue to benefit from skilled PT intervention to increase functional independence with mobility.  Patient progressing toward long term goals..  Continue plan of care.  PT Short Term Goals Week 1:  PT Short Term Goal 1 (Week 1): Pt will perform stand transfer from bed to chair with min assist. PT Short Term Goal 1 - Progress (Week 1): Met PT Short Term Goal 2 (Week 1): Pt will perform sit to stand transfer with min assist using LRAD. PT Short Term Goal 2 - Progress (Week 1): Met PT Short Term Goal 3 (Week 1): Pt will ambulate 73f with min assist using LRAD. PT Short Term Goal 3 - Progress (Week 1): Met PT Short Term Goal 4 (Week 1): Pt will perform bed mobility with mod assist. PT Short Term Goal 4 - Progress (Week 1): Met Week 2:  PT Short Term Goal 1 (Week 2): Pt will perform all functional standing transfers with  supervision. PT Short Term Goal 2 (Week 2): Pt will perform all bed mobility with Mod I. PT Short Term Goal 3 (Week 2): Pt will initiate stair negotiation training for full flight of steps. PT Short Term Goal 4 (Week 2): pt will ambulate >300 feet with LRAD and supervision.  Skilled Therapeutic Interventions/Progress Updates:  Session 1: Patient seated upright in recliner upon PT arrival to room. Patient alert and agreeable to PT session. Denied pain during session.  Therapeutic Activity: Transfers: Patient performed STS initially with light Min A and improves to CGA/ supervision during session from various seats to/ from RW. Provided verbal cues for technique to improve level of independence. Pt's posterior bias improves during session.   Gait Training:  Patient ambulated 215 feet x2 using RW with CGA. Ambulated with flexed posture, decreased step height/ length. Provided vc/tc for upright posture, level gaze, increasing hip/ knee flexion, and maintaining midline position with RW.  Neuromuscular Re-ed: NMR facilitated during session with focus on static standing balance. Pt guided in improving proprioception with standing balance challenges on Airex and ramped pliant surface with and without use of Rw for UE support. NMR performed for improvements in motor control and coordination, balance, sequencing, judgement, and self confidence/ efficacy in performing all aspects of mobility at highest level of independence.   Patient seated upright in recliner at end of session with brakes locked, belt alarm set, and all needs within reach.  Session 2: Patient seated upright in recliner upon PT arrival to room. Patient alert and agreeable to PT session. Denied pain during session.  Therapeutic Activity: Transfers: Patient performed STS and SPVT transfers  with CGA/ supervision throughout session. VC for technique and use of UE to assist with push-to-stand as well as to control descent.  Gait Training:   Patient ambulated 215 feet x2 using RW with CGA. Ambulated with flexed posture, decreased step height/ length. Provided vc/tc for upright posture, level gaze, increasing hip/ knee flexion, and maintaining midline position with RW. Noted difficulty with positioning of RW during turns requiring vc/tc for correct placement and decreased pressure into walker.   Neuromuscular Re-ed: NMR facilitated during session with focus on static/ dynamic standing balance. Pt guided in improving proprioception with standing balance challenges using BITS from distance requiring dynamic step to reach items in correct sequence. Completed with CGA and no noted LOB throughout.  NMR performed for improvements in motor control and coordination, balance, sequencing, judgement, and self confidence/ efficacy in performing all aspects of mobility at highest level of independence.   Patient seated upright in recliner at end of session with brakes locked, belt alarm set, and all needs within reach.   Therapy Documentation Precautions:  Precautions Precautions: Fall,Other (comment) Precaution Comments: Monitor BP. Posterior lean Restrictions Weight Bearing Restrictions: No   Therapy/Group: Individual Therapy  Alger Simons PT, DPT 04/16/2021, 8:05 AM

## 2021-04-16 NOTE — Plan of Care (Signed)
  Problem: Consults Goal: RH STROKE PATIENT EDUCATION Description: See Patient Education module for education specifics  Outcome: Progressing   Problem: RH BOWEL ELIMINATION Goal: RH STG MANAGE BOWEL WITH ASSISTANCE Description: STG Manage Bowel with mod I Assistance. Outcome: Progressing   Problem: RH BLADDER ELIMINATION Goal: RH STG MANAGE BLADDER WITH ASSISTANCE Description: STG Manage Bladder With  no Assistance Outcome: Progressing   Problem: RH SAFETY Goal: RH STG ADHERE TO SAFETY PRECAUTIONS W/ASSISTANCE/DEVICE Description: STG Adhere to Safety Precautions With cues/reminders Assistance/Device. Outcome: Progressing   Problem: RH COGNITION-NURSING Goal: RH STG USES MEMORY AIDS/STRATEGIES W/ASSIST TO PROBLEM SOLVE Description: STG Uses Memory Aids/Strategies With  cues/reminders Assistance to Problem Solve. Outcome: Progressing Goal: RH STG ANTICIPATES NEEDS/CALLS FOR ASSIST W/ASSIST/CUES Description: STG Anticipates Needs/Calls for Assist With cues/reminders Assistance/Cues. Outcome: Progressing   Problem: RH KNOWLEDGE DEFICIT Goal: RH STG INCREASE KNOWLEDGE OF DIABETES Description: Patient, wife and dtr will be able to manage DM with medications and dietary modifications using handouts and educational needs independently Outcome: Progressing Goal: RH STG INCREASE KNOWLEDGE OF HYPERTENSION Description: Patient, wife and dtr will be able to manage HTN with medications and dietary modifications using handouts and educational needs independently Outcome: Progressing Goal: RH STG INCREASE KNOWLEGDE OF HYPERLIPIDEMIA Description: Patient, wife and dtr will be able to manage HLD with medications and dietary modifications using handouts and educational needs independently Outcome: Progressing Goal: RH STG INCREASE KNOWLEDGE OF STROKE PROPHYLAXIS Description: Patient, wife and dtr will be able to manage secondary with medications and dietary modifications using handouts and  educational needs independently Outcome: Progressing   Problem: RH SKIN INTEGRITY Goal: RH STG MAINTAIN SKIN INTEGRITY WITH ASSISTANCE Description: STG Maintain Skin Integrity With  min Assistance. Outcome: Progressing

## 2021-04-16 NOTE — Progress Notes (Signed)
Physical Therapy Session Note  Patient Details  Name: Maxwell Rollins MRN: [id number] Date of Birth: April 26, 1936  Today's Date: 04/16/2021 PT Individual Time: 1415-1500 PT Individual Time Calculation (min): 45 min   Short Term Goals: Week 1:  PT Short Term Goal 1 (Week 1): Pt will perform stand transfer from bed to chair with min assist. PT Short Term Goal 2 (Week 1): Pt will perform sit to stand transfer with min assist using LRAD. PT Short Term Goal 3 (Week 1): Pt will ambulate 59ft with min assist using LRAD. PT Short Term Goal 4 (Week 1): Pt will perform bed mobility with mod assist. Week 2:     Skilled Therapeutic Interventions/Progress Updates: Pt presents sitting in recliner and agreeable to therapy.  Pt requires CGA for sit to stand transfers w/ verbal cues for initiation, especially forward lean and hand placement.  Pt improves w/ transfer and decreased retropulsion w/ forward lean.  Pt amb multiple trials up to 200' w/ CGA and verbal cues for posture and foot clearance .  Pt amb w/ decreased BOS and step length as fatigues.  Pt amb x 2 of 70' w/o AD and improved foot clearance and BOS.  Pt returned to room and remained sitting in recliner w/ chair alarm on and all needs in reach.     Therapy Documentation Precautions:  Precautions Precautions: Fall,Other (comment) Precaution Comments: Monitor BP. Posterior lean Restrictions Weight Bearing Restrictions: No General:   Vital Signs: Therapy Vitals Temp: 98 F (36.7 C) Pulse Rate: 66 Resp: 20 BP: 101/68 Patient Position (if appropriate): Sitting Oxygen Therapy SpO2: 100 % O2 Device: Room Air Pain:0/10      Therapy/Group: Individual Therapy  Lucio Edward 04/16/2021, 3:48 PM

## 2021-04-17 NOTE — Progress Notes (Signed)
PROGRESS NOTE   Subjective/Complaints:  Pt sleeping but awakens to voice , reviewed labs   ROS- Pt denies SOB, abd pain, CP, N/V   Objective:   No results found. Recent Labs    04/16/21 0706  WBC 7.9  HGB 13.6  HCT 40.9  PLT 177   Recent Labs    04/16/21 0706  NA 137  K 3.9  CL 103  CO2 27  GLUCOSE 96  BUN 18  CREATININE 1.40*  CALCIUM 8.9    Intake/Output Summary (Last 24 hours) at 04/17/2021 0743 Last data filed at 04/17/2021 0658 Gross per 24 hour  Intake 493 ml  Output 725 ml  Net -232 ml        Physical Exam: Vital Signs Blood pressure 123/81, pulse 60, temperature 98.6 F (37 C), temperature source Oral, resp. rate 20, height 6\' 2"  (1.88 m), weight 90.6 kg, SpO2 99 %.  General: No acute distress Mood and affect are appropriate Heart: Regular rate and rhythm no rubs murmurs or extra sounds Lungs: Clear to auscultation, breathing unlabored, no rales or wheezes Abdomen: Positive bowel sounds, soft nontender to palpation, nondistended Extremities: No clubbing, cyanosis, or edema Skin: No evidence of breakdown, no evidence of rash Neurologic: Cranial nerves II through XII intact, motor strength is 5/5 in bilateral deltoid, bicep, tricep, grip, hip flexor, knee extensors, ankle dorsiflexor and plantar flexor Oriented to person , day  Musculoskeletal: Full range of motion in all 4 extremities. No joint swelling   Assessment/Plan: 1. Functional deficits which require 3+ hours per day of interdisciplinary therapy in a comprehensive inpatient rehab setting.  Physiatrist is providing close team supervision and 24 hour management of active medical problems listed below.  Physiatrist and rehab team continue to assess barriers to discharge/monitor patient progress toward functional and medical goals  Care Tool:  Bathing    Body parts bathed by patient: Chest,Abdomen,Face,Left arm,Right arm   Body  parts bathed by helper: Front perineal area,Buttocks,Right upper leg,Left upper leg,Right lower leg,Left lower leg     Bathing assist Assist Level: Moderate Assistance - Patient 50 - 74%     Upper Body Dressing/Undressing Upper body dressing   What is the patient wearing?: Pull over shirt    Upper body assist Assist Level: Supervision/Verbal cueing    Lower Body Dressing/Undressing Lower body dressing      What is the patient wearing?: Incontinence brief,Pants     Lower body assist Assist for lower body dressing: Minimal Assistance - Patient > 75%     Toileting Toileting    Toileting assist Assist for toileting: Contact Guard/Touching assist     Transfers Chair/bed transfer  Transfers assist     Chair/bed transfer assist level: Contact Guard/Touching assist     Locomotion Ambulation   Ambulation assist   Ambulation activity did not occur: Safety/medical concerns  Assist level: Contact Guard/Touching assist Assistive device: Walker-rolling Max distance: 200   Walk 10 feet activity   Assist  Walk 10 feet activity did not occur: Safety/medical concerns  Assist level: Contact Guard/Touching assist Assistive device: Walker-rolling   Walk 50 feet activity   Assist Walk 50 feet with 2 turns activity did not  occur: Safety/medical concerns  Assist level: Contact Guard/Touching assist Assistive device: Walker-rolling    Walk 150 feet activity   Assist Walk 150 feet activity did not occur: Safety/medical concerns  Assist level: Contact Guard/Touching assist Assistive device: Walker-rolling    Walk 10 feet on uneven surface  activity   Assist Walk 10 feet on uneven surfaces activity did not occur: Safety/medical concerns         Wheelchair     Assist Will patient use wheelchair at discharge?: No (Per PT long term goals)             Wheelchair 50 feet with 2 turns activity    Assist            Wheelchair 150 feet  activity     Assist          Blood pressure 123/81, pulse 60, temperature 98.6 F (37 C), temperature source Oral, resp. rate 20, height 6\' 2"  (1.88 m), weight 90.6 kg, SpO2 99 %.     Medical Problem List and Plan: 1.  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             -patient may shower             -ELOS/Goals:4/27 ,              Continue PT, OT and SLP- team conf in am 2.  Impaired mobility -DVT/anticoagulation:  Pharmaceutical: Continue Lovenox             -antiplatelet therapy: DAPT X 3 weeks followed by ASA alone.  3. Lumbar stenosis/Pain Management: Denies back pain. N/A 4. Mood: LCSW to follow for evaluation and support.              -antipsychotic agents: N/A 5. Neuropsych: This patient is ?not fully capable of making decisions on his own behalf. 6. Skin/Wound Care: Routine pressure relief measures 7. Fluids/Electrolytes/Nutrition: Monitor I/Os             CMP ordered for Monday 8. Dyslipidemia              High dose statin. 9. HTN: Monitor BP TID             Norvasc 10mg             4/19- controlled  Vitals:   04/16/21 1939 04/17/21 0438  BP: 121/89 123/81  Pulse: 67 60  Resp: 20 20  Temp: 98.6 F (37 C)   SpO2: 96% 99%   10. Thrombocytopenia             Plts 141 on 4/6, labs ordered for Monday  4/11- Plts 155k today- is better 11.  Wet voice -appreciate SLP eval 12. CKD 3a- pt at baseline will d/c IVF    13. Delirium: likely sundowning. Discussed SVD with pt's daughter UA reviewed and negative   LOS: 11 days A FACE TO FACE EVALUATION WAS PERFORMED  Tuesday 04/17/2021, 7:43 AM

## 2021-04-17 NOTE — Progress Notes (Signed)
Physical Therapy Session Note  Patient Details  Name: Maxwell Rollins MRN: [id number] Date of Birth: [birth year]  Today's Date: 04/17/2021 PT Individual Time: 0915-1030  PT Individual Time Calculation (min): 75 min   Short Term Goals: Week 2:  PT Short Term Goal 1 (Week 2): Pt will perform all functional standing transfers with supervision. PT Short Term Goal 2 (Week 2): Pt will perform all bed mobility with Mod I. PT Short Term Goal 3 (Week 2): Pt will initiate stair negotiation training for full flight of steps. PT Short Term Goal 4 (Week 2): pt will ambulate >300 feet with LRAD and supervision.  Skilled Therapeutic Interventions/Progress Updates:  Patient seated upright in recliner upon PT arrival to room. Patient alert and agreeable to PT session. Patient c/o mild chronic LBP during session. Addressed with positioning and stretching.   Therapeutic Activity: Transfers: Patient performed STS and SPVT transfers this day with supervision to/ from no AD. Provided verbal cues for use of hands to assist with controlled rise and descent during transfers.  Gait Training:  Patient ambulated 325' x1/ 120' x1/ 55' x1 initially using RW with pt demonstrating mild difficulty with positioning of walk throughout. Requires intermittent Min A for walker repositioning with vc for improving positioning and pt unable to correct. Final 2 bouts performed with no AD but close proximity to R side HR in hallway to use prn. Pt does not use handrail throughout.  Ambulated with increasing forward bias bringin center of mass ahead of toes and causing an increase in pace to prevent forward LOB. Pt requires vc to correct initially but improves to correct without vc.   Pt guided in safety awareness in dynamic balance and gait throughout day room with collection of items placed in locations requiring problem solving and safe positioning. Pt requires vc throughout for safe positioning and performance in reach for 75% of  items.  Neuromuscular Re-ed: NMR facilitated during session with focus on standing balance. Pt guided in improving proprioception with use of Airex pad and ramped pliant surface. Pt guided in proactive/ reactive balance with arm positioning for self perturbations and for maintaining balance with surprise changes in resisting pressures. NMR performed for improvements in motor control and coordination, balance, sequencing, judgement, and self confidence/ efficacy in performing all aspects of mobility at highest level of independence.   Therapeutic Exercise: With c/o mild LBP near end of session, pt guided in seated low back and latissimus dorsi stretches with 5-10 second holds in forward and forward-diagonal stretches to each side. Provided intermittent overpressures for increased stretch  Patient seated in recliner at end of session with brakes locked, belt alarm set, and all needs within reach.     Therapy Documentation Precautions:  Precautions Precautions: Fall,Other (comment) Precaution Comments: Monitor BP. Posterior lean Restrictions Weight Bearing Restrictions: No   Therapy/Group: Individual Therapy  Loel Dubonnet PT, DPT 04/17/2021, 7:51 AM

## 2021-04-17 NOTE — Progress Notes (Signed)
Occupational Therapy Session Note  Patient Details  Name: Maxwell Rollins MRN: [id number] Date of Birth: Oct 18, 1936  Today's Date: 04/17/2021 OT Individual Time: 0804-0900 OT Individual Time Calculation (min): 56 min and 39 min   Short Term Goals: Week 2:  OT Short Term Goal 1 (Week 2): STG = LTG 2/2 ELOS  Skilled Therapeutic Interventions/Progress Updates:    Session 1 (0221-7981): Pt received asleep in bed, initially difficult to rouse despite max TC/VCs. Returned later this am to attempt session. Pt received asleep, but easily awakened by voice and TCs,  agreeable to therapy. Denies pain, but states he feels sick, "like I have a cold." Rn present and aware. Supine > sitting EOB with mod A to progress BLE off bed and lift trunk, max VCs to use bed rail. Attempted to challenge static sitting balance by eating breakfast EOB, pt unable to correct posterior lean, req max A to correct and maintain despite B feet support on ground. Stand-pivot to recliner from elevated bed and use of bed rail with overall max A after several failed attempts to correct posterior bias in standing. Donned dentures with min VCs to correct orientation, pt unaware of error. Pt req A to cut-up french toast, but is apply to open coffee and syrup containers. Donned pants with light min A to thread over L foot. Donned B socks with light min A to thread over R foot, donned B shoes with min A to thread over toes, pt able to tie and adjust over heels. Stand-pivot with front grab bar to toilet, CGA during LB clothing management. Cont void of bladder. Total A to doff/donn brief in sitting. Seated at sink, completed grooming with close S. Stand-pivot back to recliner with CGA and use of bed rail. Donned sweater close S.   Pt left in recliner with telesitter on, safety belt alarm engaged, call bell in reach, and all immediate needs met.    Session 2 [phone]): Pt received seated in recliner, denies pain, agreeable to therapy.  Session focus on functional mobility, cognition, standing balance, and activity tolerance in prep for improved ADL/func mobility performance. STS and amb to and from therapy gyms with RW + CGA. Completed 5 min at level 7 resistance on Nustep with good tolerance, denies fatigue and average steps/min 40+. Additionally, completed 9HPT to assess [facility] with the following results: R: 42 seconds, L: 43 seconds. Finally, stood to participate in a visuoconstructive task with building blocks, stood for 5 min with CGA to close S, however, req max A to complete design accurately. Amb transfer back to recliner with CGA. Pt ind able to recall location of own room.   Pt left seated in recliner with telesitter on, safety belt alarm engaged, call bell in reach, and all immediate needs met.    Therapy Documentation Precautions:  Precautions Precautions: Fall,Other (comment) Precaution Comments: Monitor BP. Posterior lean Restrictions Weight Bearing Restrictions: No Pain:   see session notes ADL: See Care Tool for more details.   Therapy/Group: Individual Therapy  Volanda Napoleon MS, OTR/L  04/17/2021, 6:38 AM

## 2021-04-18 NOTE — Progress Notes (Signed)
   04/18/21 1117  What Happened  Was fall witnessed? Yes  Who witnessed fall? OT  Patients activity before fall during therapy  Point of contact buttocks  Was patient injured? No  Follow Up  MD notified Pam L, PA  Time MD notified 1125  Family notified Yes - comment  Time family notified 1200  Additional tests No  Simple treatment Other (comment) (pt assessed post-fall)  Progress note created (see row info) Yes  Adult Fall Risk Assessment  Risk Factor Category (scoring not indicated) High fall risk per protocol (document High fall risk)  Age 85  Fall History: Fall within 6 months prior to admission 0  Elimination; Bowel and/or Urine Incontinence 2  Elimination; Bowel and/or Urine Urgency/Frequency 0  Medications: includes PCA/Opiates, Anti-convulsants, Anti-hypertensives, Diuretics, Hypnotics, Laxatives, Sedatives, and Psychotropics 5  Patient Care Equipment 1  Mobility-Assistance 2  Mobility-Gait 2  Mobility-Sensory Deficit 0  Altered awareness of immediate physical environment 0  Impulsiveness 0  Lack of understanding of one's physical/cognitive limitations 0  Total Score 15  Patient Fall Risk Level High fall risk  Adult Fall Risk Interventions  Required Bundle Interventions *See Row Information* High fall risk - low, moderate, and high requirements implemented  Additional Interventions Camera surveillance (with patient/family notification & education)  Screening for Fall Injury Risk (To be completed on HIGH fall risk patients) - Assessing Need for Floor Mats  Risk For Fall Injury- Criteria for Floor Mats TeleSitter camera in use  Will Implement Floor Mats Yes  Vitals  Temp 97.6 F (36.4 C)  BP 117/79  MAP (mmHg) 90  BP Location Left Arm  BP Method Automatic  Patient Position (if appropriate) Sitting  Pulse Rate 73  Pulse Rate Source Monitor  Resp 16  Oxygen Therapy  SpO2 99 %  O2 Device Room Air  Pain Assessment  Pain Scale 0-10  Pain Score 0  Faces Pain Scale  0  Neurological  Neuro (WDL) WDL  Level of Consciousness Alert  Orientation Level Oriented X4  Cognition Follows commands  Speech Clear  Integumentary  Integumentary (WDL) X  Skin Color Appropriate for ethnicity  Skin Condition Dry  Skin Turgor Non-tenting

## 2021-04-18 NOTE — Progress Notes (Signed)
PROGRESS NOTE   Subjective/Complaints:  No issues overnight, pt denies breathing issues or bowel/bladder problems  ROS- Pt denies SOB, abd pain, CP, N/V   Objective:   No results found. Recent Labs    04/16/21 0706  WBC 7.9  HGB 13.6  HCT 40.9  PLT 177   Recent Labs    04/16/21 0706  NA 137  K 3.9  CL 103  CO2 27  GLUCOSE 96  BUN 18  CREATININE 1.40*  CALCIUM 8.9    Intake/Output Summary (Last 24 hours) at 04/18/2021 0826 Last data filed at 04/18/2021 0700 Gross per 24 hour  Intake 360 ml  Output 125 ml  Net 235 ml        Physical Exam: Vital Signs Blood pressure 124/73, pulse 61, temperature 98.3 F (36.8 C), resp. rate 20, height 6' 2"  (1.88 m), weight 90.6 kg, SpO2 97 %.  General: No acute distress Mood and affect are appropriate Heart: Regular rate and rhythm no rubs murmurs or extra sounds Lungs: Clear to auscultation, breathing unlabored, no rales or wheezes Abdomen: Positive bowel sounds, soft nontender to palpation, nondistended Extremities: No clubbing, cyanosis, or edema Skin: No evidence of breakdown, no evidence of rash Neurologic: Cranial nerves II through XII intact, motor strength is 5/5 in bilateral deltoid, bicep, tricep, grip, hip flexor, knee extensors, ankle dorsiflexor and plantar flexor Oriented to person , day  Musculoskeletal: Full range of motion in all 4 extremities. No joint swelling   Assessment/Plan: 1. Functional deficits which require 3+ hours per day of interdisciplinary therapy in a comprehensive inpatient rehab setting.  Physiatrist is providing close team supervision and 24 hour management of active medical problems listed below.  Physiatrist and rehab team continue to assess barriers to discharge/monitor patient progress toward functional and medical goals  Care Tool:  Bathing    Body parts bathed by patient: Chest,Abdomen,Face,Left arm,Right arm   Body  parts bathed by helper: Front perineal area,Buttocks,Right upper leg,Left upper leg,Right lower leg,Left lower leg     Bathing assist Assist Level: Moderate Assistance - Patient 50 - 74%     Upper Body Dressing/Undressing Upper body dressing   What is the patient wearing?: Pull over shirt    Upper body assist Assist Level: Supervision/Verbal cueing    Lower Body Dressing/Undressing Lower body dressing      What is the patient wearing?: Pants     Lower body assist Assist for lower body dressing: Minimal Assistance - Patient > 75%     Toileting Toileting    Toileting assist Assist for toileting: Contact Guard/Touching assist     Transfers Chair/bed transfer  Transfers assist     Chair/bed transfer assist level: Maximal Assistance - Patient 25 - 49%     Locomotion Ambulation   Ambulation assist   Ambulation activity did not occur: Safety/medical concerns  Assist level: Contact Guard/Touching assist Assistive device: Walker-rolling Max distance: 200   Walk 10 feet activity   Assist  Walk 10 feet activity did not occur: Safety/medical concerns  Assist level: Contact Guard/Touching assist Assistive device: Walker-rolling   Walk 50 feet activity   Assist Walk 50 feet with 2 turns activity did not  occur: Safety/medical concerns  Assist level: Contact Guard/Touching assist Assistive device: Walker-rolling    Walk 150 feet activity   Assist Walk 150 feet activity did not occur: Safety/medical concerns  Assist level: Contact Guard/Touching assist Assistive device: Walker-rolling    Walk 10 feet on uneven surface  activity   Assist Walk 10 feet on uneven surfaces activity did not occur: Safety/medical concerns         Wheelchair     Assist Will patient use wheelchair at discharge?: No (Per PT long term goals)             Wheelchair 50 feet with 2 turns activity    Assist            Wheelchair 150 feet activity      Assist          Blood pressure 124/73, pulse 61, temperature 98.3 F (36.8 C), resp. rate 20, height 6' 2"  (1.88 m), weight 90.6 kg, SpO2 97 %.     Medical Problem List and Plan: 1.  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             -patient may shower             -ELOS/Goals:4/27 Gemma Payor ,              Continue PT, OT and SLP- Team conference today please see physician documentation under team conference tab, met with team  to discuss problems,progress, and goals. Formulized individual treatment plan based on medical history, underlying problem and comorbidities.  2.  Impaired mobility -DVT/anticoagulation:  Pharmaceutical: Continue Lovenox             -antiplatelet therapy: DAPT X 3 weeks followed by ASA alone.  3. Lumbar stenosis/Pain Management: Denies back pain. N/A 4. Mood: LCSW to follow for evaluation and support.              -antipsychotic agents: N/A 5. Neuropsych: This patient is ?not fully capable of making decisions on his own behalf. 6. Skin/Wound Care: Routine pressure relief measures 7. Fluids/Electrolytes/Nutrition: Monitor I/Os             CMP ordered for Monday 8. Dyslipidemia              High dose statin. 9. HTN: Monitor BP TID             Norvasc 7m            4/19- controlled  Vitals:   04/17/21 1957 04/18/21 0353  BP: 121/86 124/73  Pulse: 71 61  Resp: 20 20  Temp: 98.3 F (36.8 C)   SpO2: 98% 97%   10. Thrombocytopenia             Plts 141 on 4/6, labs ordered for Monday  4/11- Plts 155k today- is better 11.  Wet voice -appreciate SLP eval 12. CKD 3a- pt at baseline will d/c IVF    13. Delirium: likely sundowning. Discussed SVD with pt's daughter UA reviewed and negative   LOS: 12 days A FACE TO FACE EVALUATION WAS PERFORMED  ACharlett Blake4/20/2022, 8:26 AM

## 2021-04-18 NOTE — Progress Notes (Signed)
Team Conference Report to Patient/Family  Team Conference discussion was reviewed with the patient and caregiver, including goals, any changes in plan of care and target discharge date.  Patient and caregiver express understanding and are in agreement.  The patient has a target discharge date of 04/24/21.  SW spoke with pt wife, friend and daughter (via telephone). Provided updates for pt. Family reports it is there plan for pt to return home. Family working out arranging 24/7 supervision in home. Sw will cont to follow up with questions and concerns     Andria Rhein 04/18/2021, 1:52 PM

## 2021-04-18 NOTE — Patient Care Conference (Signed)
Inpatient RehabilitationTeam Conference and Plan of Care Update Date: 04/18/2021   Time: 10:02 AM    Patient Name: Maxwell Rollins      Medical Record Number: [id number]  Date of Birth: 1936-04-06 Sex: Male         Room/Bed: 4M04C/4M04C-01 Payor Info: Payor: MEDICARE / Plan: MEDICARE PART A AND B / Product Type: *No Product type* /    Admit Date/Time:  04/06/2021  3:22 PM  Primary Diagnosis:  Acute CVA (cerebrovascular accident) [facility])  Hospital Problems: Principal Problem:   Acute CVA (cerebrovascular accident) [facility]) Active Problems:   Stroke (cerebrum) [facility])    Expected Discharge Date: Expected Discharge Date: 04/24/21  Team Members Present: Physician leading conference: Dr. Claudette Laws Care Coodinator Present: Chana Bode, RN, BSN, CRRN;Christina Vita Barley, BSW Nurse Present: Chana Bode, RN PT Present: Grier Rocher, PT OT Present: Other (comment) Annye English, OT) SLP Present: Other (comment) Fae Pippin, SLP) PPS Coordinator present : Fae Pippin, SLP     Current Status/Progress Goal Weekly Team Focus  Bowel/Bladder             Swallow/Nutrition/ Hydration   Regular textures and thin liquids  Mod I least restrictive PO  Continue diet checks to ensure toleration prior to dischrage   ADL's   S to set-up for self-feeding, grooming; S UBD/UBB, min LBB/LBD, CGA to min A ambulatory toilet transfers; cont to demonstrate posterior bias in standing/sitting with fatigue  min A overall - may upgrade to CGA  NMR, postural control, balance, ADL/func mobility retraining, functional cognition, family/pt/DME/AE edu   Mobility   Bed mobility = Supervision/ CGA, Transfers = Supervision/ CGA with intermittent Min A for balance, ambulation = CGA with and without RW  overall CGA/ Min A  continued standing balance, activity tolerance, improving quality of gait and transfers, initiating full flight stair negotiation   Communication   Mod I intelligibility with  hoarse vocal quality  Mod I intelligibility      Safety/Cognition/ Behavioral Observations  Mod A with basic tasks  Min A for basic cognition  Continue functional problem solving, orientation, awarenss, and family education for discharge   Pain             Skin               Discharge Planning:  Pt plans to d/c home with spouse as primary caregiver. Friend and daughter will assist as well. lives in 2 level home, able to stay on main. Has cane, grab bars and shower seat   Team Discussion: Sundowning likely due in part to SVD and post stroke. Able to alert others to need to be toileted however incontinent of bowel and bladder; reinforce toileting protocol/routine.  Patient on target to meet rehab goals: yes, currently supervision to self feed  And B+D upper body. Min assist for lower body care. Continue to note posterior bias however able to ambulate with supervision  - CGA without an assistive device. Mod assist for cognition.  *See Care Plan and progress notes for long and short-term goals.   Revisions to Treatment Plan:   Teaching Needs: Toileting, safety, medications, etc.   Current Barriers to Discharge: Decreased caregiver support and Home enviroment access/layout  Possible Resolutions to Barriers: Family education    Medical Summary Current Status: Blood pressure under good control, fluid intake is improving although still requiring IV fluids.  Barriers to Discharge: Medical stability;Nutrition means   Possible Resolutions to [employer] Focus: Encourage p.o. fluid intake, caregiver instruction, educate family on  sundowning at night although this is less likely to happen at home   Continued Need for Acute Rehabilitation Level of Care: The patient requires daily medical management by a physician with specialized training in physical medicine and rehabilitation for the following reasons: Direction of a multidisciplinary physical rehabilitation program to maximize  functional independence : Yes Medical management of patient stability for increased activity during participation in an intensive rehabilitation regime.: Yes Analysis of laboratory values and/or radiology reports with any subsequent need for medication adjustment and/or medical intervention. : Yes   I attest that I was present, lead the team conference, and concur with the assessment and plan of the team.   Chana Bode B 04/18/2021, 2:14 PM

## 2021-04-18 NOTE — Progress Notes (Signed)
Physical Therapy Session Note  Patient Details  Name: Maxwell Rollins MRN: [id number] Date of Birth: 07/18/1936  Today's Date: 04/18/2021 PT Individual Time: 1130-1215 PT Individual Time Calculation (min): 45 min   Short Term Goals: Week 1:  PT Short Term Goal 1 (Week 1): Pt will perform stand transfer from bed to chair with min assist. PT Short Term Goal 1 - Progress (Week 1): Met PT Short Term Goal 2 (Week 1): Pt will perform sit to stand transfer with min assist using LRAD. PT Short Term Goal 2 - Progress (Week 1): Met PT Short Term Goal 3 (Week 1): Pt will ambulate 10f with min assist using LRAD. PT Short Term Goal 3 - Progress (Week 1): Met PT Short Term Goal 4 (Week 1): Pt will perform bed mobility with mod assist. PT Short Term Goal 4 - Progress (Week 1): Met Week 2:  PT Short Term Goal 1 (Week 2): Pt will perform all functional standing transfers with supervision. PT Short Term Goal 2 (Week 2): Pt will perform all bed mobility with Mod I. PT Short Term Goal 3 (Week 2): Pt will initiate stair negotiation training for full flight of steps. PT Short Term Goal 4 (Week 2): pt will ambulate >300 feet with LRAD and supervision. Week 3:     Skilled Therapeutic Interventions/Progress Updates:    Pain:  Pt denies pain.  Treatment to tolerance.  Rest breaks and repositioning as needed. Nursing reports fall this am, pt monitored closesly during session but denies pain mult times. Pt initially oob in recliner and agreeable to treatment session w/focus on gait mechanics.  Sit to stand w/cga to RW.  Gait 1554fw/RW, cues for upright posture, shuffling gait RLE>LLE which he could not improve w/verbal cues, cues to remain centered in walker vs bias to R  Actitities to promote improved step length and height: Standing w/walker tapping 6 in step, progressed to tapping w/heels, advanced further by performing w/single UE. Standing w/walker reaching to target w/alternating heels, dual/single UE  support.  Discussed gait mechanics, shffling vs full step length/heel strike, efficiency.  Gait 12029f/RW, cues to continue w/stepping to imaginary targets w/heels using cues "reach" and "stretch" w/improved step length approx 40% time but reverts to shuffling approx every 8-10 steps requiring "reset". Therapist raised walker to full available height to promote/ improve upright posture. Gait 150f11fRW w/cues to reach targets in given number of steps (ie. Reach door in 10 steps)which worked very well and pt able to demonstrate much improved consistent step length w/this strategy.  Pt left oob in recliner w/chair alarm set and needs in reach.     Therapy Documentation Precautions:  Precautions Precautions: Fall,Other (comment) Precaution Comments: Monitor BP. Posterior lean Restrictions Weight Bearing Restrictions: No    Therapy/Group: Individual Therapy  BarbCallie Fielding  [place]0/2022, 12:48 PM

## 2021-04-18 NOTE — Progress Notes (Signed)
Occupational Therapy Session Note  Patient Details  Name: Maxwell Rollins MRN: [id number] Date of Birth: [birth year]  Today's Date: 04/18/2021 OT Individual Time: 1572-6203 OT Individual Time Calculation (min): 50 min    Short Term Goals: Week 2:  OT Short Term Goal 1 (Week 2): STG = LTG 2/2 ELOS  Skilled Therapeutic Interventions/Progress Updates:    Pt received semi-reclined in bed, denies pain, agreeable to therapy, declines showering. Came to sitting EOB close S + use of bed features, chair transfer no AD with CGA. Distant S for seated grooming. Donned and tied B shoes close S with increased time, able to don via figure 4. Donned gait belt total A. Amb to therapy gym with CGA and RW. STS from couch with min A to boost up from low surface + RW. Pt states he has walk-in shower with double built-in shower seats. With use of wooden lip + shower chair, demonstrated walk-in shower transfer with anterior approach. Pt attempted to simulate with min hand-held assist to simulate pt's grab bar, great difficulty following directions/sequencing task to safely sit on shower chair. Pt's L foot crossed over his left foot when instructed to side-step L, resulting in pt balance posteriorly, this therapist able to assist pt lowering to floor  with use of gait belt. Pt fell on his sacrum, pt immediately denying pain/N/T, answering questions appropriately. Assessed BLE, no injuries noted minus small skin abrasion on L shin that was bleeding. Guided pt in fall recovery with min A to boost up on coach and return to seated position. RN Clifton James immediately notified and present to assess for further/injuries vitals. Pt able to return to stand and complete w/c transfer with min A + 2 for safety with use of RW. W/c transport back to room and stand-pivot back to recliner with no AD + use of bed rail + CGA.    Pt left seated in recliner with telesitter on, safety belt alarm engaged, call bell in reach, and all immediate  needs met. Cont to deny pain, edu pt to contact RN if pain/change in status arises.   Therapy Documentation Precautions:  Precautions Precautions: Fall,Other (comment) Precaution Comments: Monitor BP. Posterior lean Restrictions Weight Bearing Restrictions: No Pain: Pain Assessment Pain Scale: 0-10 Pain Score: 0-No pain Faces Pain Scale: No hurt ADL: See Care Tool for more details.   Therapy/Group: Individual Therapy  Volanda Napoleon MS, OTR/L  04/18/2021, 12:44 PM

## 2021-04-18 NOTE — Progress Notes (Signed)
Speech Language Pathology Daily Session Note  Patient Details  Name: Sharmarke Cicio MRN: [id number] Date of Birth: May 14, 1936  Today's Date: 04/18/2021 SLP Individual Time: 1345-1430 SLP Individual Time Calculation (min): 45 min  Short Term Goals: Week 2: SLP Short Term Goal 1 (Week 2): Patient will demonstrate functinal problem solving for basic and familiar tasks with Min verbal cues. SLP Short Term Goal 2 (Week 2): Patient will utilize external memory aids for orientation to place, date and situation with Min verbal and visual cues. SLP Short Term Goal 3 (Week 2): Patient will utilize word-finding strategies at the sentence level with supervision level verbal cues.  Skilled Therapeutic Interventions: Skilled treatment session focused on cognitive goals. SLP facilitated session by initially providing Max A verbal cues for complex problem solving during a medication management task. However, as task progressed, patient only required supervision level verbal cues to self-monitor and correct errors. SLP also utilized a piece of paper to minimize visual distractions while scanning medication list. Patient continues to require total A for anticipatory awareness as he continues to think he will be providing assistance for his wife upon discharge. Patient's wife present throughout session and educated on importance of 24 hour supervision and need for assistance with complex tasks at home like medication management. She verbalized understanding. Patient left upright in recliner with alarm on and all needs within reach. Continue with current plan of care.      Pain Pain Assessment Pain Scale: 0-10 Pain Score: 0-No pain Faces Pain Scale: No hurt  Therapy/Group: Individual Therapy  Lachlan Mckim 04/18/2021, 3:17 PM

## 2021-04-19 NOTE — Progress Notes (Signed)
Patient ID: Maxwell Rollins, male   DOB: Aug 02, 1936, 85 y.o.   MRN: [id number]  HB and Bedside Com[employer] ordered through [employer].  HH and HC list provided to pt family.   [place], [place] [phone]

## 2021-04-19 NOTE — Progress Notes (Signed)
Speech Language Pathology Daily Session Note  Patient Details  Name: Maxwell Rollins MRN: [id number] Date of Birth: [birth year]  Today's Date: 04/19/2021 SLP Individual Time: 0626-9485 SLP Individual Time Calculation (min): 40 min  Short Term Goals: Week 2: SLP Short Term Goal 1 (Week 2): Patient will demonstrate functinal problem solving for basic and familiar tasks with Min verbal cues. SLP Short Term Goal 2 (Week 2): Patient will utilize external memory aids for orientation to place, date and situation with Min verbal and visual cues. SLP Short Term Goal 3 (Week 2): Patient will utilize word-finding strategies at the sentence level with supervision level verbal cues.  Skilled Therapeutic Interventions: Skilled treatment session focused on cognitive goals. Upon arrival, patient appeared mildly confused by paperwork that were left on his table by SW. SLP explained reasoning for paperwork but will need to give information to a family member. SLP administered the [facility] Mental Status Examination (SLUMS) and patient scored 14/30 points with a score of 27 or above considered normal. Patient demonstrated deficits in short-term recall and basic problem solving. Patient educated on results of assessment but continues to demonstrate poor awareness of deficits. Patient left upright in recliner with alarm on and all needs within reach. Continue with current plan of care.       Pain Pain Assessment Pain Scale: Faces Pain Score: 0-No pain  Therapy/Group: Individual Therapy  Adraine Biffle 04/19/2021, 3:10 PM

## 2021-04-19 NOTE — Progress Notes (Signed)
Patient ID: Maxwell Rollins, male   DOB: 1936-10-06, 85 y.o.   MRN: [id number]   Hospital Bed and [facility] ordered through [employer].   [place], [place] [phone]

## 2021-04-19 NOTE — Progress Notes (Signed)
PROGRESS NOTE   Subjective/Complaints:  No issues overnight , discussed d/c date, appears brighter  ROS- Pt denies SOB, abd pain, CP, N/V   Objective:   No results found. No results for input(s): WBC, HGB, HCT, PLT in the last 72 hours. No results for input(s): NA, K, CL, CO2, GLUCOSE, BUN, CREATININE, CALCIUM in the last 72 hours.  Intake/Output Summary (Last 24 hours) at 04/19/2021 0751 Last data filed at 04/19/2021 0522 Gross per 24 hour  Intake 755 ml  Output 875 ml  Net -120 ml        Physical Exam: Vital Signs Blood pressure 127/82, pulse 65, temperature 98.4 F (36.9 C), temperature source Oral, resp. rate 19, height 6\' 2"  (1.88 m), weight 90.6 kg, SpO2 96 %.   General: No acute distress Mood and affect are appropriate Heart: Regular rate and rhythm no rubs murmurs or extra sounds Lungs: Clear to auscultation, breathing unlabored, no rales or wheezes Abdomen: Positive bowel sounds, soft nontender to palpation, nondistended Extremities: No clubbing, cyanosis, or edema Skin: No evidence of breakdown, no evidence of rash Neurologic: Cranial nerves II through XII intact, motor strength is 5/5 in bilateral deltoid, bicep, tricep, grip, hip flexor, knee extensors, ankle dorsiflexor and plantar flexor Sensory exam normal sensation to light touch and proprioception in bilateral upper and lower extremities  Oriented to person , day  Musculoskeletal: Full range of motion in all 4 extremities. No joint swelling   Assessment/Plan: 1. Functional deficits which require 3+ hours per day of interdisciplinary therapy in a comprehensive inpatient rehab setting.  Physiatrist is providing close team supervision and 24 hour management of active medical problems listed below.  Physiatrist and rehab team continue to assess barriers to discharge/monitor patient progress toward functional and medical goals  Care  Tool:  Bathing    Body parts bathed by patient: Chest,Abdomen,Face,Left arm,Right arm   Body parts bathed by helper: Front perineal area,Buttocks,Right upper leg,Left upper leg,Right lower leg,Left lower leg     Bathing assist Assist Level: Moderate Assistance - Patient 50 - 74%     Upper Body Dressing/Undressing Upper body dressing   What is the patient wearing?: Pull over shirt    Upper body assist Assist Level: Supervision/Verbal cueing    Lower Body Dressing/Undressing Lower body dressing      What is the patient wearing?: Pants     Lower body assist Assist for lower body dressing: Minimal Assistance - Patient > 75%     Toileting Toileting    Toileting assist Assist for toileting: Contact Guard/Touching assist     Transfers Chair/bed transfer  Transfers assist     Chair/bed transfer assist level: Contact Guard/Touching assist     Locomotion Ambulation   Ambulation assist   Ambulation activity did not occur: Safety/medical concerns  Assist level: Contact Guard/Touching assist Assistive device: Walker-rolling Max distance: 200   Walk 10 feet activity   Assist  Walk 10 feet activity did not occur: Safety/medical concerns  Assist level: Contact Guard/Touching assist Assistive device: Walker-rolling   Walk 50 feet activity   Assist Walk 50 feet with 2 turns activity did not occur: Safety/medical concerns  Assist level: Contact Guard/Touching assist  Assistive device: Walker-rolling    Walk 150 feet activity   Assist Walk 150 feet activity did not occur: Safety/medical concerns  Assist level: Contact Guard/Touching assist Assistive device: Walker-rolling    Walk 10 feet on uneven surface  activity   Assist Walk 10 feet on uneven surfaces activity did not occur: Safety/medical concerns         Wheelchair     Assist Will patient use wheelchair at discharge?: No (Per PT long term goals)             Wheelchair 50 feet  with 2 turns activity    Assist            Wheelchair 150 feet activity     Assist          Blood pressure 127/82, pulse 65, temperature 98.4 F (36.9 C), temperature source Oral, resp. rate 19, height 6\' 2"  (1.88 m), weight 90.6 kg, SpO2 96 %.     Medical Problem List and Plan: 1.  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             -patient may shower             -ELOS/Goals:4/27 ,              Continue PT, OT and SLP-   2.  Impaired mobility -DVT/anticoagulation:  Pharmaceutical: Continue Lovenox             -antiplatelet therapy: DAPT X 3 weeks followed by ASA alone.  3. Lumbar stenosis/Pain Management: Denies back pain. N/A 4. Mood: LCSW to follow for evaluation and support.              -antipsychotic agents: N/A 5. Neuropsych: This patient is ?not fully capable of making decisions on his own behalf. 6. Skin/Wound Care: Routine pressure relief measures 7. Fluids/Electrolytes/Nutrition: Monitor I/Os             CMP ordered for Monday 8. Dyslipidemia              High dose statin. 9. HTN: Monitor BP TID             Norvasc 10mg             4/21- controlled  Vitals:   04/18/21 2003 04/19/21 0442  BP: 114/76 127/82  Pulse: 70 65  Resp: 17 19  Temp: 98.5 F (36.9 C) 98.4 F (36.9 C)  SpO2: 98% 96%   10. Thrombocytopenia             resolved 4/18 177Kr 11.  Wet voice -appreciate SLP eval 12. CKD 3a- pt at baseline will d/c IVF    13. Sundowning. Discussed SVD with pt's daughter UA reviewed and negative   LOS: 13 days A FACE TO FACE EVALUATION WAS PERFORMED  04/21/21 04/19/2021, 7:51 AM

## 2021-04-19 NOTE — Progress Notes (Signed)
Occupational Therapy Session Note  Patient Details  Name: Maxwell Rollins MRN: [id number] Date of Birth: Nov 16, 1936  Today's Date: 04/19/2021 OT Individual Time: 1107-1200 OT Individual Time Calculation (min): 53 min    Short Term Goals: Week 2:  OT Short Term Goal 1 (Week 2): STG = LTG 2/2 ELOS  Skilled Therapeutic Interventions/Progress Updates:    Pt received seated in recliner with LPN present, agreeable to therapy. Session focus on functional mobility/transfers, safe RW use, dynamic standing balance, functional cognition, activity tolerance, and dressing. Stand-pivot transfer with use of bed rail with CGA. W/c transport to and from gym 2/2 time management. Completed toe tap activity with use of RW and cognitive component in prep for improved step-over shower transfers. Overall, able to follow simple 1-step directions to complete activity with 100% and CGA for balance. Massed practice of turning L and R with RW as pt is noted to favor wide R turns during transfers. Performance improved with VCs to stay inside RW and to make tighter turns, however pt cont to walk past w/c and req max VCs to return to seat. Stood at [employer] to complete 3 short-term memory tasks, with scores improving from 62% to 71%, however, pt with difficulty recalling verbal sequence of 2+ words. Finally, pt doffed/donned shirt close S, doffed pants via lateral leans, and donned new pants close S. Pt noted to be impulsive and attempting to stand before locking breaks and remove foot rests from w/c, and attempting to walk around room after being instructed to sit down.   Pt left in recliner with telesitter on, safety belt alarm engaged, call bell in reach, and all immediate needs met.    Therapy Documentation Precautions:  Precautions Precautions: Fall,Other (comment) Precaution Comments: Monitor BP. Posterior lean Restrictions Weight Bearing Restrictions: No Pain: Pain Assessment Pain Scale: Faces Pain Score: 0-No  pain ADL: See Care Tool for more details.   Therapy/Group: Individual Therapy  Volanda Napoleon MS, OTR/L   04/19/2021, 12:29 PM

## 2021-04-19 NOTE — Progress Notes (Signed)
Physical Therapy Session Note  Patient Details  Name: Maxwell Rollins MRN: [id number] Date of Birth: Oct 18, 1936  Today's Date: 04/19/2021 PT Individual Time: 4431-5400 PT Individual Time Calculation (min): 42 min   Short Term Goals: Week 2:  PT Short Term Goal 1 (Week 2): Pt will perform all functional standing transfers with supervision. PT Short Term Goal 2 (Week 2): Pt will perform all bed mobility with Mod I. PT Short Term Goal 3 (Week 2): Pt will initiate stair negotiation training for full flight of steps. PT Short Term Goal 4 (Week 2): pt will ambulate >300 feet with LRAD and supervision.  Skilled Therapeutic Interventions/Progress Updates:   Pt received sitting in WC and agreeable to PT. PT obtained Rollator. Gait training with rollator through rehab unit 2 x 181f with supervision assist and min cues for AD parts management for use of brakes in transfers and safety in turns.   Dynamic balance training instructed by PT to perform 3 foam lego puzzles (low difficulty) while standing on blue wedge. Supervision assist throughout from PT to prevent posterior LOB, but noted to maintain 1 UE Support on rolling table.  Pt then performed lateral reach and toss of horse shoe from blue wedge. Min=mod assist throughout to prevent posterior LOB with very poor activation of ankle strategy to correct LOB, regardless of instrcution/facilitation from PT.   Patient returned to room and performed stand pivot to recliner with rollaror and suprvision  assist. Pt left sitting in recliner with call bell in reach and all needs met.         Therapy Documentation Precautions:  Precautions Precautions: Fall,Other (comment) Precaution Comments: Monitor BP. Posterior lean Restrictions Weight Bearing Restrictions: No    Pain: Pain Assessment Pain Scale: Faces Pain Score: 0-No pain    Therapy/Group: Individual Therapy  ALorie Phenix4/21/2022, 1:44 PM

## 2021-04-19 NOTE — Progress Notes (Signed)
Physical Therapy Session Note  Patient Details  Name: Maxwell Rollins MRN: [id number] Date of Birth: 1936-10-18  Today's Date: 04/19/2021 PT Individual Time: 1000-1058 PT Individual Time Calculation (min): 58 min   Short Term Goals: Week 2:  PT Short Term Goal 1 (Week 2): Pt will perform all functional standing transfers with supervision. PT Short Term Goal 2 (Week 2): Pt will perform all bed mobility with Mod I. PT Short Term Goal 3 (Week 2): Pt will initiate stair negotiation training for full flight of steps. PT Short Term Goal 4 (Week 2): pt will ambulate >300 feet with LRAD and supervision.  Skilled Therapeutic Interventions/Progress Updates:   Pt received sitting in WC and agreeable to PT  Gait training through hall 2 x 260f with supervision assist and RW. Cues for increased step length and height on the LLE with fatigue to prevent foot drag and improve safety. Throughout treatment pt performed sit<>stand transfers with supervision assist with and without AD  Nustep BLE/BUE activity tolerance training x 6 minute with cues for full ROM and increased SPM  Dynamic balance training to perform lateral reach followed by bean bag toss to target. Supervision assist with no AD Pt then picked up all bean bags off corn hole board 2 x 8 with CGA. Cues for wide BOS and attention to task intermittently.   Pt instructed in modified [place] level A. Balance program .  LAQ x 10, hip abduction x 12, mini squat x 12, HS curl x 12 tandem stance 2x10sec with no UE support. Cues for full ROM and decreased compensation.   Patient returned to room and performed stand pivot to recliner with RW. Pt left sitting in recliner with call bell in reach and all needs met.            Therapy Documentation Precautions:  Precautions Precautions: Fall,Other (comment) Precaution Comments: Monitor BP. Posterior lean Restrictions Weight Bearing Restrictions: No Pain: Pain Assessment Pain Scale:  0-10 Pain Score: 0-No pain    Therapy/Group: Individual Therapy  ALorie Phenix4/21/2022, 11:03 AM

## 2021-04-20 NOTE — Plan of Care (Signed)
  Problem: RH Memory Goal: LTG Patient will demonstrate ability for day to day (SLP) Description: LTG:   Patient will demonstrate ability for day to day recall/carryover during cognitive/linguistic activities with assist  (SLP) Flowsheets (Taken 04/20/2021 0616) LTG: Patient will demonstrate ability for day to day recall/carryover during cognitive/linguistic activities with assist (SLP): Moderate Assistance - Patient 50 - 74% Note: Downgraded due to lack of progress

## 2021-04-20 NOTE — Progress Notes (Signed)
PROGRESS NOTE   Subjective/Complaints:  No issues overnite, no bowel or bladder c/os is continent  ROS- Pt denies SOB, abd pain, CP, N/V   Objective:   No results found. No results for input(s): WBC, HGB, HCT, PLT in the last 72 hours. No results for input(s): NA, K, CL, CO2, GLUCOSE, BUN, CREATININE, CALCIUM in the last 72 hours.  Intake/Output Summary (Last 24 hours) at 04/20/2021 0745 Last data filed at 04/19/2021 1900 Gross per 24 hour  Intake 720 ml  Output --  Net 720 ml        Physical Exam: Vital Signs Blood pressure 111/74, pulse 64, temperature 97.8 F (36.6 C), temperature source Oral, resp. rate 16, height 6\' 2"  (1.88 m), weight 90.6 kg, SpO2 97 %.   General: No acute distress Mood and affect are appropriate Heart: Regular rate and rhythm no rubs murmurs or extra sounds Lungs: Clear to auscultation, breathing unlabored, no rales or wheezes Abdomen: Positive bowel sounds, soft nontender to palpation, nondistended Extremities: No clubbing, cyanosis, or edema Skin: No evidence of breakdown, no evidence of rash Neurologic: Cranial nerves II through XII intact, motor strength is 5/5 in bilateral deltoid, bicep, tricep, grip, hip flexor, knee extensors, ankle dorsiflexor and plantar flexor Sensory exam normal sensation to light touch and proprioception in bilateral upper and lower extremities  Oriented to person , day  Musculoskeletal: Full range of motion in all 4 extremities. No joint swelling   Assessment/Plan: 1. Functional deficits which require 3+ hours per day of interdisciplinary therapy in a comprehensive inpatient rehab setting.  Physiatrist is providing close team supervision and 24 hour management of active medical problems listed below.  Physiatrist and rehab team continue to assess barriers to discharge/monitor patient progress toward functional and medical goals  Care Tool:  Bathing     Body parts bathed by patient: Chest,Abdomen,Face,Left arm,Right arm   Body parts bathed by helper: Front perineal area,Buttocks,Right upper leg,Left upper leg,Right lower leg,Left lower leg     Bathing assist Assist Level: Moderate Assistance - Patient 50 - 74%     Upper Body Dressing/Undressing Upper body dressing   What is the patient wearing?: Pull over shirt    Upper body assist Assist Level: Supervision/Verbal cueing    Lower Body Dressing/Undressing Lower body dressing      What is the patient wearing?: Pants     Lower body assist Assist for lower body dressing: Contact Guard/Touching assist     Toileting Toileting    Toileting assist Assist for toileting: Contact Guard/Touching assist     Transfers Chair/bed transfer  Transfers assist     Chair/bed transfer assist level: Contact Guard/Touching assist     Locomotion Ambulation   Ambulation assist   Ambulation activity did not occur: Safety/medical concerns  Assist level: Contact Guard/Touching assist Assistive device: Walker-rolling Max distance: 200   Walk 10 feet activity   Assist  Walk 10 feet activity did not occur: Safety/medical concerns  Assist level: Contact Guard/Touching assist Assistive device: Walker-rolling   Walk 50 feet activity   Assist Walk 50 feet with 2 turns activity did not occur: Safety/medical concerns  Assist level: Contact Guard/Touching assist Assistive device: Walker-rolling  Walk 150 feet activity   Assist Walk 150 feet activity did not occur: Safety/medical concerns  Assist level: Contact Guard/Touching assist Assistive device: Walker-rolling    Walk 10 feet on uneven surface  activity   Assist Walk 10 feet on uneven surfaces activity did not occur: Safety/medical concerns         Wheelchair     Assist Will patient use wheelchair at discharge?: No (Per PT long term goals)             Wheelchair 50 feet with 2 turns  activity    Assist            Wheelchair 150 feet activity     Assist          Blood pressure 111/74, pulse 64, temperature 97.8 F (36.6 C), temperature source Oral, resp. rate 16, height 6\' 2"  (1.88 m), weight 90.6 kg, SpO2 97 %.     Medical Problem List and Plan: 1.  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             -patient may shower             -ELOS/Goals:4/26 ,              Continue PT, OT and SLP-   2.  Impaired mobility -DVT/anticoagulation:  Pharmaceutical: Continue Lovenox             -antiplatelet therapy: DAPT X 3 weeks followed by ASA alone.  3. Lumbar stenosis/Pain Management: Denies back pain. N/A 4. Mood: LCSW to follow for evaluation and support.              -antipsychotic agents: N/A 5. Neuropsych: This patient is ?not fully capable of making decisions on his own behalf. 6. Skin/Wound Care: Routine pressure relief measures 7. Fluids/Electrolytes/Nutrition: Monitor I/Os             CMP ordered for Monday 8. Dyslipidemia              High dose statin. 9. HTN: Monitor BP TID             Norvasc 10mg             4/21- controlled  Vitals:   04/19/21 1953 04/20/21 0527  BP: 115/85 111/74  Pulse: 81 64  Resp: 17 16  Temp: 98.6 F (37 C) 97.8 F (36.6 C)  SpO2: 97% 97%   10. Thrombocytopenia             resolved 4/18 177Kr 11.  Wet voice -appreciate SLP eval 12. CKD 3a- pt at baseline will d/c IVF    13. Sundowning. Discussed SVD with pt's daughter UA reviewed and negative   LOS: 14 days A FACE TO FACE EVALUATION WAS PERFORMED  04/22/21 04/20/2021, 7:45 AM

## 2021-04-20 NOTE — Progress Notes (Signed)
Speech Language Pathology Weekly Progress and Session Note  Patient Details  Name: Maddox Hlavaty MRN: [id number] Date of Birth: [birth year]  Beginning of progress report period: April 13, 2021 End of progress report period: April 20, 2021  Today's Date: 04/20/2021 SLP Individual Time: 5974-1638 SLP Individual Time Calculation (min): 45 min  Short Term Goals: Week 2: SLP Short Term Goal 1 (Week 2): Patient will demonstrate functinal problem solving for basic and familiar tasks with Min verbal cues. SLP Short Term Goal 1 - Progress (Week 2): Not met SLP Short Term Goal 2 (Week 2): Patient will utilize external memory aids for orientation to place, date and situation with Min verbal and visual cues. SLP Short Term Goal 2 - Progress (Week 2): Met SLP Short Term Goal 3 (Week 2): Patient will utilize word-finding strategies at the sentence level with supervision level verbal cues. SLP Short Term Goal 3 - Progress (Week 2): Met  New Short Term Goals: Week 3: SLP Short Term Goal 1 (Week 3): STGs=LTGs due to ELOS  Weekly Progress Updates: Patient has made functional but inconsistent gains and has met 2 of 3 STGs this reporting period. Currently, patient requires overall Min A verbal and visual cues for use of external aids for orientation information but requires increased cueing for recall of novel information and functional problem solving, especially as it relates to safety. Patient also continues to demonstrate decreased awareness of deficits impacting patient's insight for d/c planning. Patient's overall word-finding as improved at the sentence level but may be impacted by fatigue at times. Patient and family education ongoing. Patient would benefit from continued skilled SLP intervention to maximize his cognitive functioning prior to discharge.   Intensity: Minumum of 1-2 x/day, 30 to 90 minutes Frequency: 3 to 5 out of 7 days Duration/Length of Stay: 4/26 Treatment/Interventions:  Cognitive remediation/compensation;Cueing hierarchy;Functional tasks;Therapeutic Activities;Therapeutic Exercise;Dysphagia/aspiration precaution training;Medication managment;Patient/family education;Speech/Language facilitation;Internal/external aids;Environmental controls   Daily Session  Skilled Therapeutic Interventions:     Pt seen for skilled ST with focus on cognitive goals, wife and brother in law present for beginning of tx session. Once they left, pt mildly agitated and requiring cues to re-focus and increase participation in therapeutic activities. SLP facilitating mildly complex problem solving task with focus on daily math by providing mod A verbal cues for 60% accuracy. When patient having difficulty with tasks, would often begin off topic conversation or refuse to complete. Pt continues to demonstrate decreased insight into current deficits, when discussing loss of balance during other therapy sessions this date pt states it was "due to the heat and weather". Pt and family will benefit from ongoing education and training to increase safety and independence at home. Pt left in recliner with alarm belt on, all needs within reach. Cont ST POC.   Pain Pain Assessment Pain Scale: 0-10 Pain Score: 0-No pain  Therapy/Group: Individual Therapy  PAYNE, COURTNEY 04/20/2021, 6:46 AM

## 2021-04-20 NOTE — Progress Notes (Signed)
Occupational Therapy Session Note  Patient Details  Name: Maxwell Rollins MRN: [id number] Date of Birth: Apr 11, 1936  Today's Date: 04/20/2021 OT Individual Time:  -       Short Term Goals: Week 1:  OT Short Term Goal 1 (Week 1): Pt will be able to rise to stand with mod A of 1 to prep for LB self care. OT Short Term Goal 1 - Progress (Week 1): Met OT Short Term Goal 2 (Week 1): Pt will be able to stand with mod A of 1 while pulling pants over hips with LB dressing. OT Short Term Goal 2 - Progress (Week 1): Met OT Short Term Goal 3 (Week 1): Pt will be able to transfer on and off the toilet with mod A of 1. OT Short Term Goal 3 - Progress (Week 1): Met  Skilled Therapeutic Interventions/Progress Updates:    Pt participated in rhythmic drumming group. Pain not reported during session. Focus of group on BUE coordination, strengthening, endurance, timing/control, activity tolerance, and social participation and engagement. Pt performs session from seated position for energy conservation. Skilled interventions included slowing down movements to pace/rhythm pt could follow and decreasing complexity of patterns to improve success. Pt intermittently drumming to own beat but smiling. Warm up performed prior to exercises and UB stretching completed at end of group with demo from OT. Pt able to select preferred song to share with group. Returned pt to room at end of session. Exited session with pt seated in recliner, exit alarm on and call light in reach  Therapy Documentation Precautions:  Precautions Precautions: Fall,Other (comment) Precaution Comments: Monitor BP. Posterior lean Restrictions Weight Bearing Restrictions: No General:   Vital Signs: Therapy Vitals Temp: 97.8 F (36.6 C) Temp Source: Oral Pulse Rate: 64 Resp: 16 BP: 111/74 Patient Position (if appropriate): Lying Oxygen Therapy SpO2: 97 % O2 Device: Room Air Pain: Pain Assessment Pain Scale: 0-10 Pain Score:  0-No pain ADL: ADL Eating: Set up Grooming: Supervision/safety Upper Body Bathing: Supervision/safety,Moderate cueing Where Assessed-Upper Body Bathing: Sitting at sink Lower Body Bathing: Maximal assistance Where Assessed-Lower Body Bathing: Sitting at sink,Standing at sink Upper Body Dressing: Maximal assistance Where Assessed-Upper Body Dressing: Wheelchair Lower Body Dressing: Dependent Where Assessed-Lower Body Dressing: Wheelchair Toileting: Dependent Where Assessed-Toileting: Bedside Commode Toilet Transfer: Other (comment) (stedy) Vision   Perception    Praxis   Exercises:   Other Treatments:     Therapy/Group: Group Therapy  Tonny Branch 04/20/2021, 6:44 AM

## 2021-04-20 NOTE — Progress Notes (Signed)
Physical Therapy Session Note  Patient Details  Name: Maxwell Rollins MRN: [id number] Date of Birth: 1936/09/15  Today's Date: 04/20/2021 PT Individual Time: 0830-0915 PT Individual Time Calculation (min): 45 min   Short Term Goals: Week 2:  PT Short Term Goal 1 (Week 2): Pt will perform all functional standing transfers with supervision. PT Short Term Goal 2 (Week 2): Pt will perform all bed mobility with Mod I. PT Short Term Goal 3 (Week 2): Pt will initiate stair negotiation training for full flight of steps. PT Short Term Goal 4 (Week 2): pt will ambulate >300 feet with LRAD and supervision.  Skilled Therapeutic Interventions/Progress Updates:     Patient in recliner in the room upon PT arrival. Patient alert and agreeable to PT session. Patient denied pain during session.  Therapeutic Activity: Transfers: Patient performed sit to/from stand x5 with close supervision with and without RW. Provided verbal cues for hand placement on RW and reaching back to sit. Patient able to teach back cues on last trial.  Gait Training:  Patient ambulated 150 feet x2 using RW with CGA-close supervision. Ambulated with decreased gait speed, step height, and step length R>L with intermittent shuffling gait on first trial, decreased DF with forefoot at initial contact and R foot drag intermittently,forward trunk flexion, and downward head gaze. Provided verbal cues for erect posture, looking ahead, proximity to RW, increased gait speed, and heel at initial contact for improved foot clearance and step length. Second trial with significant improvement following other gait interventions and education.  Obstical course for improved foot clearance and simulating home-type challenges. -stepping over 2 hokey sticks on the floor, avoiding 2 cones, reciprocal stepping on 6 visual targets (colored dots), ascending/descending 4-6 inch steps using B rails then returning back through the obstacles using RW with  CGA. Hit both hockey sticks with R foot going to the stairs, but much improved with mod cues for technique on return. Performed step-to with intermittent reciprocal gait pattern on steps with difficulty following cues for step-to gait pattern without LOB or increased assist.  Ambulated ~150 feet with 2 180 degree turns with hands on therapist's shoulders to promote forward propulsion and increased step length with facilitation through gait belt to bring patient forward, significant increased in gait speed and step length with this activity.   Performed B DF stretch in sitting with mild hamstring stretch with knee extended, provided manual over pressure at end range with posterior glide to talus, just past neutral on L to neutral on R 2x1 min  Educated patient on impact of decreased DF ROM on balance and gait, demonstrated gait deficits and discussed techniques to reduce falls with increased foot clearance, step length, and gait speed.  Patient in recliner in the room at end of session with breaks locked, seat belt alarm set, Telesitter in the room, and all needs within reach.    Therapy Documentation Precautions:  Precautions Precautions: Fall,Other (comment) Precaution Comments: Monitor BP. Posterior lean Restrictions Weight Bearing Restrictions: No   Therapy/Group: Individual Therapy  Brandell Maready L Vishwa Dais PT, DPT  04/20/2021, 11:18 AM

## 2021-04-20 NOTE — Progress Notes (Signed)
Patient ID: Maxwell Rollins, male   DOB: [birth year], 85 y.o.   MRN: [id number]  Rollater ordered through [employer].  [place], [place] [phone]

## 2021-04-20 NOTE — Progress Notes (Signed)
   04/20/21 1241  Clinical Encounter Type  Visited With Patient and family together  Visit Type Follow-up  Referral From Nurse  Consult/Referral To Chaplain  Spiritual Encounters  Spiritual Needs Literature (HCPOA)  Chaplain spoke with Mrs. Whitlatch concerning the HCPOA. Paperwork was filled out properly and answered all questions.  Explained to Mrs. Graser, our notary will be in Monday and I will contact her to make an appointment to have the HCPOA witnessed & notarized for 10:30 am.   Delight Stare [place] [phone]

## 2021-04-20 NOTE — Progress Notes (Signed)
Physical Therapy Session Note  Patient Details  Name: Maxwell Rollins MRN: [id number] Date of Birth: 1936-11-27  Today's Date: 04/20/2021 PT Individual Time: 1019-1100 and 1300-1338 PT Individual Time Calculation (min): 41 min and 38 min    Week 2:  PT Short Term Goal 1 (Week 2): Pt will perform all functional standing transfers with supervision. PT Short Term Goal 2 (Week 2): Pt will perform all bed mobility with Mod I. PT Short Term Goal 3 (Week 2): Pt will initiate stair negotiation training for full flight of steps. PT Short Term Goal 4 (Week 2): pt will ambulate >300 feet with LRAD and supervision.  Skilled Therapeutic Interventions/Progress Updates:  Session 1.   Pt received sitting in WC and agreeable to PT.   Gait training with Rollator through hall x 256ft with supervision assist cues for improved step height to reduce foot drag. Pt performed performed Dynamic gait training with no AD to step over 8 1" obstcles on floor x 2. Noted to lose balance with turns and intermittently with stepping with LLE requiring min assist to prevent posterior LOB . Pt performed navigation of 8 cones on floor x 2 with rollator, no LOB throughout turns through cones, requiring only supervision assist from PT.   Standing Active gastroc/soleus stretch.  On 4 inch step, forward lung on step with 3 inch hold x 10. Cues for improved knee extension on posterior limb to improve   Nustep BLE reciprocal activity tolerance training x 10 minutes. Cues for improved ankle ROM at end range to allow improved DF ROM with Active assist stretch.   Seated trunkal control with beach ball. Trunk flexion extension x 8, diagonals x 8 Bil with cues for improved ROm through tspine.   Pt left with OT for additional therapy treatment.    Session 2.   Pt received sitting in WC and agreeable to PT\ . Gait training through rehab unit with Rollator 2x152ft and supervision assist and cues for improved step height.   BITS  dynamic standing balance training with cogniitive task :  Visual  scanning time paced 5 sec. 87% 2.5 average reaction  Visual scanning sequence A-z. 76%, 7 sec average reaction.  Supervision assist to maintain balance with cues for visual scanning and improved retention of proper sequence.   Pt transported to toilet following incontinent episode supervision assist from with cues for use of AD. NT took over care of pt on toilet.        Therapy Documentation Precautions:  Precautions Precautions: Fall,Other (comment) Precaution Comments: Monitor BP. Posterior lean Restrictions Weight Bearing Restrictions: No    Pain:   denies     Therapy/Group: Individual Therapy  Golden Pop 04/20/2021, 11:29 AM

## 2021-04-21 NOTE — Progress Notes (Signed)
Physical Therapy Session Note  Patient Details  Name: Maxwell Rollins MRN: [id number] Date of Birth: August 07, 1936  Today's Date: 04/21/2021 PT Individual Time: 1300-1343 PT Individual Time Calculation (min): 43 min   Short Term Goals: Week 2:  PT Short Term Goal 1 (Week 2): Pt will perform all functional standing transfers with supervision. PT Short Term Goal 2 (Week 2): Pt will perform all bed mobility with Mod I. PT Short Term Goal 3 (Week 2): Pt will initiate stair negotiation training for full flight of steps. PT Short Term Goal 4 (Week 2): pt will ambulate >300 feet with LRAD and supervision.  Skilled Therapeutic Interventions/Progress Updates:    Patient received sitting up in recliner, agreeable to PT. He denies pain. He was able to ambulate ~348ft with 4WW to dayroom with light CGA/close supervision. Slow gait speed, NBOS and decreased B foot clearance noted. Dynamic standing balance task playing wii. Initial difficulty coordinating use of remote, but improved with hand over hand assist. Patient able to remain standing ~5-6 mins at a time before needing to sit. He ambulated back to his room with 4WW and CGA, requesting to use bathroom. Patient found to have small episode of bowel incontinence. Continent void of urine. Clothing management and perihygiene completed in standing with supervision assist. Patient returning to recliner, seatbelt alarm on, call light within reach.   Therapy Documentation Precautions:  Precautions Precautions: Fall,Other (comment) Precaution Comments: Monitor BP. Posterior lean Restrictions Weight Bearing Restrictions: No    Therapy/Group: Individual Therapy  Elizebeth Koller, PT, DPT, CBIS  04/21/2021, 7:39 AM

## 2021-04-21 NOTE — Progress Notes (Signed)
Occupational Therapy Discharge Summary  Patient Details  Name: Maxwell Rollins MRN: [id number] Date of Birth: [birth year]   Patient has met 45 of 12 long term goals due to improved activity tolerance, improved balance, postural control, ability to compensate for deficits, improved attention, improved awareness and improved coordination.  Patient to discharge at overall Supervision level.  Patient's care partner unavailable to provide the necessary physical and cognitive assistance at discharge.    Reasons goals not met: Pt cont to be primarily limited by decreased ability to follow multi-step commands, dynamic standing balance/balance strategies, posterior bias in standing, and mild impulsiveness. Rec 24/7 S at DC and CGA for functional transfers.  Recommendation:  Patient will benefit from ongoing skilled OT services in home health setting to continue to advance functional skills in the area of BADL and Reduce care partner burden.  Equipment: 3in1  Reasons for discharge: treatment goals met and discharge from hospital  Patient/family agrees with progress made and goals achieved: Yes  OT Discharge Precautions/Restrictions  Precautions Precautions: Fall;Other (comment) Precaution Comments: posterior bias in standing, cognitive deficits baseline Restrictions Weight Bearing Restrictions: No ADL ADL Eating: Independent Where Assessed-Eating: Bed level,Chair Grooming: Supervision/safety Where Assessed-Grooming: Sitting at sink,Standing at sink Upper Body Bathing: Supervision/safety Where Assessed-Upper Body Bathing: Sitting at sink Lower Body Bathing: Supervision/safety,Contact guard Where Assessed-Lower Body Bathing: Sitting at sink Upper Body Dressing: Supervision/safety Where Assessed-Upper Body Dressing: Sitting at sink Lower Body Dressing: Supervision/safety,Contact guard Where Assessed-Lower Body Dressing: Standing at sink,Sitting at sink Toileting: Contact guard Where  Assessed-Toileting: Editor, commissioning Method: Ambulating,Stand Ecologist: Grab bars,Raised toilet seat Vision Baseline Vision/History: Wears glasses Patient Visual Report: No change from baseline Vision Assessment?: No apparent visual deficits Perception  Perception: Within Functional Limits Praxis Praxis: Impaired Praxis Impairment Details: Motor planning Praxis-Other Comments: greatly improved from eval, still exhibits some difficulty motor planning transfers Cognition Overall Cognitive Status: Impaired/Different from baseline Arousal/Alertness: Awake/alert Orientation Level: Oriented X4 Attention: Sustained Sustained Attention: Impaired Sustained Attention Impairment: Verbal basic;Functional complex Memory: Impaired Memory Impairment: Retrieval deficit;Decreased recall of new information;Decreased short term memory Decreased Short Term Memory: Verbal basic Awareness: Impaired Problem Solving: Impaired Executive Function: Self Monitoring;Self Correcting Safety/Judgment: Impaired Comments: Cont to demonstrate difficulty following multi-step instructions, completing complex visuospacial/problem solving tasks, impaired safety awareness/minimally impulsive with transfers Sensation Sensation Light Touch: Appears Intact Hot/Cold: Appears Intact Proprioception: Impaired by gross assessment Stereognosis: Appears Intact Additional Comments: Impaired BLE propioception during functional transfers, greatly improved from eval, able to self-correct minimally for improved gait/safe RW use Coordination Gross Motor Movements are Fluid and Coordinated: No Coordination and Movement Description: Rigidity in movement, generalized weakness, decreased step length/foot clearance during gait, greatly improved from eval Motor  Motor Motor: Motor apraxia Motor - Discharge Observations: generalized weakness, shuffling gait pattern, posterior  bias in standing at times, greatly improved from eval Mobility  Transfers Sit to Stand: Supervision/Verbal cueing Stand to Sit: Supervision/Verbal cueing  Trunk/Postural Assessment  Cervical Assessment Cervical Assessment: Exceptions to [facility] (slight forward head) Thoracic Assessment Thoracic Assessment: Exceptions to [facility] (kyphotic) Lumbar Assessment Lumbar Assessment: Exceptions to [facility] (posterior pelvic tilt) Postural Control Postural Control: Deficits on evaluation  Balance Balance Balance Assessed: Yes Static Sitting Balance Static Sitting - Balance Support: Feet supported Static Sitting - Level of Assistance: 5: Stand by assistance;7: Independent (S EOB 2/2 impusliveness) Dynamic Sitting Balance Dynamic Sitting - Balance Support: Feet supported Dynamic Sitting - Level of Assistance: 5: Stand by assistance (S) Dynamic Sitting - Balance Activities: Forward  lean/weight shifting;Lateral lean/weight shifting;Reaching across midline;Reaching for objects Sitting balance - Comments: able to maintain balance to don/tie shoes Static Standing Balance Static Standing - Level of Assistance: 5: Stand by assistance (close S) Dynamic Standing Balance Dynamic Standing - Balance Support: Bilateral upper extremity supported;During functional activity Dynamic Standing - Level of Assistance: 5: Stand by assistance Extremity/Trunk Assessment RUE Assessment RUE Assessment: Within Functional Limits Active Range of Motion (AROM) Comments: WFL General Strength Comments: shoulder 4-, elbow 4+, grasp 3+/5 LUE Assessment LUE Assessment: Within Functional Limits (grossly WFL for ADL) Active Range of Motion (AROM) Comments: sh flexion 140 General Strength Comments: shoulder 3+, elbow 4+, grasp 3+/5   Volanda Napoleon MS, OTR/L  04/21/2021, 9:38 AM

## 2021-04-21 NOTE — Progress Notes (Signed)
Physical Therapy Session Note  Patient Details  Name: Maxwell Rollins MRN: [id number] Date of Birth: September 14, 1936  Today's Date: 04/21/2021 PT Individual Time: 6226-3335 PT Individual Time Calculation (min): 59 min   Short Term Goals: Week 2:  PT Short Term Goal 1 (Week 2): Pt will perform all functional standing transfers with supervision. PT Short Term Goal 2 (Week 2): Pt will perform all bed mobility with Mod I. PT Short Term Goal 3 (Week 2): Pt will initiate stair negotiation training for full flight of steps. PT Short Term Goal 4 (Week 2): pt will ambulate >300 feet with LRAD and supervision.  Skilled Therapeutic Interventions/Progress Updates:    Pt received sitting in recliner. Did not report any pain and agreeable to therapy. STS CGA 4WW and gait training to day room CGA 4WW ~332ft; decreased gait speed, decreased step length/height (RLE step height > LLE), posterior lean, and downward gaze noted throughout gait. VC provided to take larger steps and look up; pt somewhat responsive to cues and intermittently remembers them, but often reverts back to the gait pattern mentioned before.  Consecutive cone stepping performed to increase B step height performed CGA with 4WW. Pt initially unable to recall how to perform activity and walked around cones. Therapist demonstrated intended activity with desired actions, pt verbalized understanding. However, activity re-attempted and pt demonstrated improvement, but still demonstrated deficits in understanding (I.e. he stepped over a cone with one leg and around with the other. Moreover, pt stepped early on multiple repititions, lifting his legs far before reaching the cones; possibly a reflection of poor depth perception. Shortly into the activity, pt verbalized that he was having a BM. Gait training back to room ~310ft CGA with 4WW. Brief doffed total assist in standing, and pt sat directly onto toilet CGA with R grab bar. BM present and pt  continent of urine. STS CGA with R grab bar, and pericare performed max assist. New brief and pants donned max assist.  Gait training back to day room ~355ft CGA 4WW with multiple turns. In contrast with previous sessions, pt did not demonstrate LOB when turning. Cone stepping activity briefly attempted again, but similar deficits with understanding as noted before were once again observed.  Gait training ~347ft CGA [id number] and pt instructed with way-finding task to return to room as cognitive dual task. Pt not confident throughout ambulation of which way to go, demonstrating 2 incorrect turns. However, pt able to recognize room upon arrival to midwest wing.  Pt left sitting in recliner with seat belt alarm turned on and needs within reach. The following judgement impairment was noted at the end of the session. Pt attempted to read the clock and verbalized the wrong time, confusing the two clock arms. Pt donned his glasses and tried again, providing a different answer, but was still incorrect. Therapist ultimately told pt the correct time.    Therapy Documentation Precautions:  Precautions Precautions: Fall,Other (comment) Precaution Comments: posterior bias in standing, cognitive deficits baseline Restrictions Weight Bearing Restrictions: No    Therapy/Group: Individual Therapy  Paschal Dopp, SPT 04/21/2021, 4:38 PM

## 2021-04-21 NOTE — Progress Notes (Signed)
PROGRESS NOTE   Subjective/Complaints:  Up in chair. No new complaints.   ROS: Patient denies fever, rash, sore throat, blurred vision, nausea, vomiting, diarrhea, cough, shortness of breath or chest pain, joint or back pain, headache, or mood change.    Objective:   No results found. No results for input(s): WBC, HGB, HCT, PLT in the last 72 hours. No results for input(s): NA, K, CL, CO2, GLUCOSE, BUN, CREATININE, CALCIUM in the last 72 hours.  Intake/Output Summary (Last 24 hours) at 04/21/2021 0842 Last data filed at 04/21/2021 0758 Gross per 24 hour  Intake 840 ml  Output 325 ml  Net 515 ml        Physical Exam: Vital Signs Blood pressure (!) 134/92, pulse 67, temperature 98.4 F (36.9 C), resp. rate 16, height 6\' 2"  (1.88 m), weight 90.6 kg, SpO2 97 %.   Constitutional: No distress . Vital signs reviewed. HEENT: EOMI, oral membranes moist Neck: supple Cardiovascular: RRR without murmur. No JVD    Respiratory/Chest: CTA Bilaterally without wheezes or rales. Normal effort    GI/Abdomen: BS +, non-tender, non-distended Ext: no clubbing, cyanosis, or edema Psych: pleasant and cooperative Skin: No evidence of breakdown, no evidence of rash Neurologic: Cranial nerves II through XII intact, motor strength is 5/5 in bilateral deltoid, bicep, tricep, grip, hip flexor, knee extensors, ankle dorsiflexor and plantar flexor Sensory exam normal sensation to light touch and proprioception in bilateral upper and lower extremities  Oriented to person , day, place Musculoskeletal: Full range of motion in all 4 extremities. No joint swelling   Assessment/Plan: 1. Functional deficits which require 3+ hours per day of interdisciplinary therapy in a comprehensive inpatient rehab setting.  Physiatrist is providing close team supervision and 24 hour management of active medical problems listed below.  Physiatrist and rehab team  continue to assess barriers to discharge/monitor patient progress toward functional and medical goals  Care Tool:  Bathing    Body parts bathed by patient: Chest,Abdomen,Face,Left arm,Right arm   Body parts bathed by helper: Front perineal area,Buttocks,Right upper leg,Left upper leg,Right lower leg,Left lower leg     Bathing assist Assist Level: Moderate Assistance - Patient 50 - 74%     Upper Body Dressing/Undressing Upper body dressing   What is the patient wearing?: Pull over shirt    Upper body assist Assist Level: Supervision/Verbal cueing    Lower Body Dressing/Undressing Lower body dressing      What is the patient wearing?: Pants     Lower body assist Assist for lower body dressing: Contact Guard/Touching assist     Toileting Toileting    Toileting assist Assist for toileting: Contact Guard/Touching assist     Transfers Chair/bed transfer  Transfers assist     Chair/bed transfer assist level: Contact Guard/Touching assist     Locomotion Ambulation   Ambulation assist   Ambulation activity did not occur: Safety/medical concerns  Assist level: Contact Guard/Touching assist Assistive device: Walker-rolling Max distance: 150 ft   Walk 10 feet activity   Assist  Walk 10 feet activity did not occur: Safety/medical concerns  Assist level: Contact Guard/Touching assist Assistive device: Walker-rolling   Walk 50 feet activity  Assist Walk 50 feet with 2 turns activity did not occur: Safety/medical concerns  Assist level: Contact Guard/Touching assist Assistive device: Walker-rolling    Walk 150 feet activity   Assist Walk 150 feet activity did not occur: Safety/medical concerns  Assist level: Contact Guard/Touching assist Assistive device: Walker-rolling    Walk 10 feet on uneven surface  activity   Assist Walk 10 feet on uneven surfaces activity did not occur: Safety/medical concerns         Wheelchair     Assist  Will patient use wheelchair at discharge?: No (Per PT long term goals)             Wheelchair 50 feet with 2 turns activity    Assist            Wheelchair 150 feet activity     Assist          Blood pressure (!) 134/92, pulse 67, temperature 98.4 F (36.9 C), resp. rate 16, height 6\' 2"  (1.88 m), weight 90.6 kg, SpO2 97 %.     Medical Problem List and Plan: 1.  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             -patient may shower             -ELOS/Goals:4/26 /Supervision ,             -Continue CIR therapies including PT, OT, and SLP    2.  Impaired mobility -DVT/anticoagulation:  Pharmaceutical: Continue Lovenox             -antiplatelet therapy: DAPT X 3 weeks followed by ASA alone.  3. Lumbar stenosis/Pain Management: Denies back pain. N/A 4. Mood: LCSW to follow for evaluation and support.              -antipsychotic agents: N/A 5. Neuropsych: This patient is ?not fully capable of making decisions on his own behalf. 6. Skin/Wound Care: Routine pressure relief measures 7. Fluids/Electrolytes/Nutrition: Monitor I/Os             CMP ordered for Monday 8. Dyslipidemia              High dose statin. 9. HTN: Monitor BP TID             Norvasc 10mg             4/23- borderline control  Vitals:   04/20/21 1946 04/21/21 0346  BP: 110/75 (!) 134/92  Pulse: 70 67  Resp: 16 16  Temp: 98.2 F (36.8 C) 98.4 F (36.9 C)  SpO2: 97% 97%   10. Thrombocytopenia             resolved 4/18 177Kr 11.  Wet voice -appreciate SLP eval 12. CKD 3a- pt at baseline will d/c IVF    13. Sundowning. Discussed SVD with pt's daughter UA reviewed and negative   LOS: 15 days A FACE TO FACE EVALUATION WAS PERFORMED  04/23/21 04/21/2021, 8:42 AM

## 2021-04-21 NOTE — Progress Notes (Signed)
Speech Language Pathology Daily Session Note  Patient Details  Name: Maxwell Rollins MRN: [id number] Date of Birth: 1936-04-06  Today's Date: 04/21/2021 SLP Individual Time: 1100-1130 SLP Individual Time Calculation (min): 30 min  Short Term Goals: Week 3: SLP Short Term Goal 1 (Week 3): STGs=LTGs due to ELOS  Skilled Therapeutic Interventions: Skilled treatment session focused on cognitive goals. SLP facilitated session by providing overall Min A verbal cues for problem solving during a mildly complex sorting task. However, patient attended to task for ~30 minutes with overall Mod I. Patient also initiated idea of utilizing rubber bands to help keep items organized within in specific categories. Patient left upright in the recliner with alarm on and all needs within reach. Continue with current plan of care.      Pain Pain Assessment Pain Scale: 0-10 Pain Score: 0-No pain  Therapy/Group: Individual Therapy  Madolin Twaddle 04/21/2021, 1:11 PM

## 2021-04-22 NOTE — Progress Notes (Signed)
Physical Therapy Session Note  Patient Details  Name: Maxwell Rollins MRN: [id number] Date of Birth: [birth year]  Today's Date: 04/22/2021 PT Individual Time: 1500-1605 PT Individual Time Calculation (min): 65 min   Short Term Goals: Week 2:  PT Short Term Goal 1 (Week 2): Pt will perform all functional standing transfers with supervision. PT Short Term Goal 2 (Week 2): Pt will perform all bed mobility with Mod I. PT Short Term Goal 3 (Week 2): Pt will initiate stair negotiation training for full flight of steps. PT Short Term Goal 4 (Week 2): pt will ambulate >300 feet with LRAD and supervision.     Skilled Therapeutic Interventions/Progress Updates:  Pt seated in recliner.  He thought he did not have any therapies today; PT showed him printed schedule and he agreed to participate.  He denied pain.   Warm- up exercises, seated: marching, bil heel raises, R/L ankle pumps with long arc quad knee extensions, bil hip adductor squeezes.   Sit> stand :  1st attempt with LOB backwards > uncontrolled sitting.   2nd attempt with min assist and max cues.  Gait training with rollator on level tile, x 90' with CGA.  Visual and VCs for brakes of rollator about 50% of time during session.  Gait x 150' x 2 to/from therapy room, CGA/close supervision.    neuromuscular re-education for balance retraining: sustained stretch bil heel cords and hamstrings, and balance challenge, standing with forefeet on blue wedge, with bil UE support, x 3 minutes.  Sit>< stand with hands across chest throughout session, to challenge balance strategies, CGA> close supervision. With same set-up in standing, pt manipulated bolts, nuts, and washers to sort them on table in front of him, x 7 minutes, with contralateral UE support on table.  In standing, pt demonstrated improving balance strategies at hips and ankles, given external perturbations.   Standing on airex pad x 1 minute with bil UE support> 0UE support.   Pt noted  to stand with bias toward L, with slight knee flexion. Pelvis symmetrical in standing; pt has no knowledge of possible leg length discrepancy.  At end of session, pt seated in recliner with seat belt alarm set and needs at hand.        Therapy Documentation Precautions:  Precautions Precautions: Fall,Other (comment) Precaution Comments: posterior bias in standing, cognitive deficits baseline Restrictions Weight Bearing Restrictions: No       Therapy/Group: Individual Therapy  Denina Rieger 04/22/2021, 4:14 PM

## 2021-04-23 LAB — CBC
HCT: 39.5 % (ref 39.0–52.0)
Hemoglobin: 13.2 g/dL (ref 13.0–17.0)
MCH: 29.5 pg (ref 26.0–34.0)
MCHC: 33.4 g/dL (ref 30.0–36.0)
MCV: 88.4 fL (ref 80.0–100.0)
Platelets: 169 10*3/uL (ref 150–400)
RBC: 4.47 MIL/uL (ref 4.22–5.81)
RDW: 14.6 % (ref 11.5–15.5)
WBC: 7.9 10*3/uL (ref 4.0–10.5)
nRBC: 0 % (ref 0.0–0.2)

## 2021-04-23 LAB — BASIC METABOLIC PANEL
Anion gap: 6 (ref 5–15)
BUN: 28 mg/dL — ABNORMAL HIGH (ref 8–23)
CO2: 27 mmol/L (ref 22–32)
Calcium: 8.7 mg/dL — ABNORMAL LOW (ref 8.9–10.3)
Chloride: 105 mmol/L (ref 98–111)
Creatinine, Ser: 1.35 mg/dL — ABNORMAL HIGH (ref 0.61–1.24)
GFR, Estimated: 52 mL/min — ABNORMAL LOW (ref >60)
Glucose, Bld: 103 mg/dL — ABNORMAL HIGH (ref 70–99)
Potassium: 4 mmol/L (ref 3.5–5.1)
Sodium: 138 mmol/L (ref 135–145)

## 2021-04-23 LAB — SARS CORONAVIRUS 2 (TAT 6-24 HRS): SARS Coronavirus 2: NEGATIVE

## 2021-04-23 NOTE — Progress Notes (Signed)
Patient ID: Maxwell Rollins, male   DOB: 08-16-1936, 85 y.o.   MRN: [id number]   Pt accepted by [facility] on the HUB. Sw called AD to check bed availability and potential transfer date. Left VM, sw will wait for call back from AD.  [place], [place] [phone]

## 2021-04-23 NOTE — Plan of Care (Signed)
  Problem: RH Balance Goal: LTG Patient will maintain dynamic sitting balance (PT) Description: LTG:  Patient will maintain dynamic sitting balance with assistance during mobility activities (PT) Outcome: Completed/Met Flowsheets (Taken 04/23/2021 1231) LTG: Pt will maintain dynamic sitting balance during mobility activities with:: Independent with assistive device  Goal: LTG Patient will maintain dynamic standing balance (PT) Description: LTG:  Patient will maintain dynamic standing balance with assistance during mobility activities (PT) Outcome: Completed/Met Flowsheets (Taken 04/23/2021 1231) LTG: Pt will maintain dynamic standing balance during mobility activities with:: Minimal Assistance - Patient > 75% Note: Pt generally at [place] level but intermittently requires Min A to maintain balance.   Problem: Sit to Stand Goal: LTG:  Patient will perform sit to stand with assistance level (PT) Description: LTG:  Patient will perform sit to stand with assistance level (PT) Outcome: Completed/Met Flowsheets (Taken 04/23/2021 1231) LTG: PT will perform sit to stand in preparation for functional mobility with assistance level: Supervision/Verbal cueing   Problem: RH Bed Mobility Goal: LTG Patient will perform bed mobility with assist (PT) Description: LTG: Patient will perform bed mobility with assistance, with/without cues (PT). Outcome: Completed/Met Flowsheets (Taken 04/23/2021 1235) LTG: Pt will perform bed mobility with assistance level of: Contact Guard/Touching assist   Problem: RH Bed to Chair Transfers Goal: LTG Patient will perform bed/chair transfers w/assist (PT) Description: LTG: Patient will perform bed to chair transfers with assistance (PT). Outcome: Completed/Met Flowsheets (Taken 04/23/2021 1235) LTG: Pt will perform Bed to Chair Transfers with assistance level: Contact Guard/Touching assist   Problem: RH Car Transfers Goal: LTG Patient will perform car transfers with assist  (PT) Description: LTG: Patient will perform car transfers with assistance (PT). Outcome: Completed/Met Flowsheets (Taken 04/23/2021 1235) LTG: Pt will perform car transfers with assist:: Contact Guard/Touching assist   Problem: RH Ambulation Goal: LTG Patient will ambulate in controlled environment (PT) Description: LTG: Patient will ambulate in a controlled environment, # of feet with assistance (PT). Outcome: Completed/Met Flowsheets (Taken 04/23/2021 1235) LTG: Pt will ambulate in controlled environ  assist needed:: Contact Guard/Touching assist LTG: Ambulation distance in controlled environment: >300 ft Goal: LTG Patient will ambulate in home environment (PT) Description: LTG: Patient will ambulate in home environment, # of feet with assistance (PT). Outcome: Completed/Met Flowsheets (Taken 04/07/2021 1854 by Eleonore Chiquito, Student-PT) LTG: Pt will ambulate in home environ  assist needed:: Contact Guard/Touching assist LTG: Ambulation distance in home environment: 28ft   Problem: RH Stairs Goal: LTG Patient will ambulate up and down stairs w/assist (PT) Description: LTG: Patient will ambulate up and down # of stairs with assistance (PT) Outcome: Completed/Met Flowsheets (Taken 04/23/2021 1235) LTG: Pt will ambulate up/down stairs assist needed:: Contact Guard/Touching assist LTG: Pt will  ambulate up and down number of stairs: full hospital flight of 11 steps landing and 11 steps using BHR

## 2021-04-23 NOTE — Progress Notes (Signed)
Physical Therapy Session Note  Patient Details  Name: Karin Griffith MRN: [id number] Date of Birth: 07-31-1936  Today's Date: 04/23/2021 PT Individual Time: 1044-1130 PT Individual Time Calculation (min): 46 min   Short Term Goals: Week 2:  PT Short Term Goal 1 (Week 2): Pt will perform all functional standing transfers with supervision. PT Short Term Goal 2 (Week 2): Pt will perform all bed mobility with Mod I. PT Short Term Goal 3 (Week 2): Pt will initiate stair negotiation training for full flight of steps. PT Short Term Goal 4 (Week 2): pt will ambulate >300 feet with LRAD and supervision.  Skilled Therapeutic Interventions/Progress Updates: Pt presents sitting in recliner chair and agreeable to therapy.  Previous PT recommends amb outside as pt hasn't been outside.  Pt wheeled to outside for time conservation.  Pt amb across uneven terrain up to 120' w/ CGA to close supervision and verbal cues for posture and rollator management.  Pt performed transfers from multiple surfaces w/ CGA to supervision, verbal cueing and education for safety to include locking rollator brakes and hand placement.  Pt amb across threshold into facility and to elevator.  Pt pushing buttons for elevator and to small gym up to 250'.  Pt does fatigue and increased toe drag w/ longer walks.  Pt returned to room and sat in recliner w/ seat alarm on and all needs in reach.     Therapy Documentation Precautions:  Precautions Precautions: Fall,Other (comment) Precaution Comments: posterior bias in standing, cognitive deficits baseline Restrictions Weight Bearing Restrictions: No General:   Vital Signs: Therapy Vitals BP: 121/87 Pain: 0/10 Pain Assessment Pain Scale: Faces Faces Pain Scale: No hurt Mobility:      Therapy/Group: Individual Therapy  Lucio Edward 04/23/2021, 12:27 PM

## 2021-04-23 NOTE — Progress Notes (Signed)
Patient ID: Maxwell Rollins, male   DOB: 1936-06-20, 85 y.o.   MRN: [id number]  Pt scheduled to d/c to SNF on tomorrow. Pt family plans to be here tomorrow morning and transport patient for d/c. Sw will cancel all DME.   [place], [place] [phone]

## 2021-04-23 NOTE — Progress Notes (Addendum)
Patient ID: Alwaleed Obeso, male   DOB: 1936-10-26, 85 y.o.   MRN: [id number]   SW received VM from pt daughter. Pt daughter report her and pt no longer feel comfortable with the d/c home on 4/26. Both would like to look into SNF. SW will follow up with pt and discuss. SW sending out pt SNF referral.  *Pt daughter requesting referral to be sent to [facility]. SW called facility, left VM with admission director. SW awaiting a phone back, will update team.   [place], [place] [phone]

## 2021-04-23 NOTE — Progress Notes (Signed)
Occupational Therapy Session Note  Patient Details  Name: Maxwell Rollins MRN: [id number] Date of Birth: [birth year]  Today's Date: 04/23/2021 OT Individual Time: 1300-1355 OT Individual Time Calculation (min): 55 min    Short Term Goals: Week 2:  OT Short Term Goal 1 (Week 2): STG = LTG 2/2 ELOS  Skilled Therapeutic Interventions/Progress Updates:    Pt in recliner to start of session.  He was able to complete sit to stand with close supervision and mod instructional cueing for hand placement to not pull up on the walker.  He exhibited severe LOB when attempting to let go of the walker and turn around to pick up his cell phone.  Mod to max assist needed to regain balance with pt's LEs crossing each other.  He regained his balance and then ambulated down to the ADL apartment with close supervision using the rollator.  Mod demonstrational cueing needed throughout session to lock the rollator brakes before sitting.  He completed sit to stand from the couch for completion of functional mobility down to the ortho gym where he worked on [employer] on [employer].  He completed 4 sets of 2 min intervals in standing with min guard assist to recall.  He was only able to consistently remember 3-4 word sequences and press them using the pointer and the LUE.  Increased difficulty pressing inside the word at times, requiring therapist to increase the size of it.  Had him finish session with ambulation back to the ADL apartment for practice with walk-in shower transfers using the rollator and shower seat.  Min guard assist to complete transfer.  Finished session with return to the room at min guard assist using the rollator for support.  He was left sitting in the recliner with the call button and phone in reach and safety belt in place.      Therapy Documentation Precautions:  Precautions Precautions: Fall,Other (comment) Precaution Comments: posterior bias in standing after being seated for long  period of time, cognitive deficits/ dementia baseline Restrictions Weight Bearing Restrictions: No   Pain: Pain Assessment Pain Scale: Faces Pain Score: 0-No pain ADL: See Care Tool Section for some details of mobility and selfcare  Therapy/Group: Individual Therapy  Ahmiyah Coil OTR/L 04/23/2021, 1:58 PM

## 2021-04-23 NOTE — Progress Notes (Signed)
PROGRESS NOTE   Subjective/Complaints: Per SW daughter now pursuing SNF, FL2 signed Pt without c/os , finished lunch bowels and bladder doing well  ROS: Patient denies CP, SOB, N/V/D  Objective:   No results found. Recent Labs    04/23/21 0449  WBC 7.9  HGB 13.2  HCT 39.5  PLT 169   Recent Labs    04/23/21 0449  NA 138  K 4.0  CL 105  CO2 27  GLUCOSE 103*  BUN 28*  CREATININE 1.35*  CALCIUM 8.7*    Intake/Output Summary (Last 24 hours) at 04/23/2021 0828 Last data filed at 04/23/2021 0800 Gross per 24 hour  Intake 880 ml  Output 204 ml  Net 676 ml        Physical Exam: Vital Signs Blood pressure 119/79, pulse 68, temperature 98.3 F (36.8 C), resp. rate 20, height 6\' 2"  (1.88 m), weight 90.6 kg, SpO2 96 %.   General: No acute distress Mood and affect are appropriate Heart: Regular rate and rhythm no rubs murmurs or extra sounds Lungs: Clear to auscultation, breathing unlabored, no rales or wheezes Abdomen: Positive bowel sounds, soft nontender to palpation, nondistended Extremities: No clubbing, cyanosis, or edema Skin: No evidence of breakdown, no evidence of rash  Skin: No evidence of breakdown, no evidence of rash Neurologic: Cranial nerves II through XII intact, motor strength is 5/5 in bilateral deltoid, bicep, tricep, grip, hip flexor, knee extensors, ankle dorsiflexor and plantar flexor Sensory exam normal sensation to light touch and proprioception in bilateral upper and lower extremities  Oriented to person , day, place Musculoskeletal: Full range of motion in all 4 extremities. No joint swelling   Assessment/Plan: 1. Functional deficits which require 3+ hours per day of interdisciplinary therapy in a comprehensive inpatient rehab setting.  Physiatrist is providing close team supervision and 24 hour management of active medical problems listed below.  Physiatrist and rehab team  continue to assess barriers to discharge/monitor patient progress toward functional and medical goals  Care Tool:  Bathing    Body parts bathed by patient: Chest,Abdomen,Face,Left arm,Right arm   Body parts bathed by helper: Front perineal area,Buttocks,Right upper leg,Left upper leg,Right lower leg,Left lower leg     Bathing assist Assist Level: Moderate Assistance - Patient 50 - 74%     Upper Body Dressing/Undressing Upper body dressing   What is the patient wearing?: Pull over shirt    Upper body assist Assist Level: Supervision/Verbal cueing    Lower Body Dressing/Undressing Lower body dressing      What is the patient wearing?: Pants     Lower body assist Assist for lower body dressing: Contact Guard/Touching assist     Toileting Toileting    Toileting assist Assist for toileting: Contact Guard/Touching assist     Transfers Chair/bed transfer  Transfers assist     Chair/bed transfer assist level: Contact Guard/Touching assist     Locomotion Ambulation   Ambulation assist   Ambulation activity did not occur: Safety/medical concerns  Assist level: Contact Guard/Touching assist Assistive device: Rollator Max distance: 150   Walk 10 feet activity   Assist  Walk 10 feet activity did not occur: Safety/medical concerns  Assist  level: Contact Guard/Touching assist Assistive device: Rollator   Walk 50 feet activity   Assist Walk 50 feet with 2 turns activity did not occur: Safety/medical concerns  Assist level: Contact Guard/Touching assist Assistive device: Rollator    Walk 150 feet activity   Assist Walk 150 feet activity did not occur: Safety/medical concerns  Assist level: Contact Guard/Touching assist Assistive device: Rollator    Walk 10 feet on uneven surface  activity   Assist Walk 10 feet on uneven surfaces activity did not occur: Safety/medical concerns         Wheelchair     Assist Will patient use wheelchair at  discharge?: No (Per PT long term goals)             Wheelchair 50 feet with 2 turns activity    Assist            Wheelchair 150 feet activity     Assist          Blood pressure 119/79, pulse 68, temperature 98.3 F (36.8 C), resp. rate 20, height 6\' 2"  (1.88 m), weight 90.6 kg, SpO2 96 %.     Medical Problem List and Plan: 1.  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             -patient may shower             -ELOS/Goals:4/26 /Supervision ,             -Continue CIR therapies including PT, OT, and SLP    2.  Impaired mobility -DVT/anticoagulation:  Pharmaceutical: Continue Lovenox             -antiplatelet therapy: DAPT X 3 weeks followed by ASA alone.  3. Lumbar stenosis/Pain Management: Denies back pain. N/A 4. Mood: LCSW to follow for evaluation and support.              -antipsychotic agents: N/A 5. Neuropsych: This patient is ?not fully capable of making decisions on his own behalf. 6. Skin/Wound Care: Routine pressure relief measures 7. Fluids/Electrolytes/Nutrition: Monitor I/Os             CMP ordered for Monday 8. Dyslipidemia              High dose statin. 9. HTN: Monitor BP TID             Norvasc 10mg             4/25 controlled  Vitals:   04/22/21 1917 04/23/21 0353  BP: 111/70 119/79  Pulse: 74 68  Resp: 16 20  Temp: 98.6 F (37 C) 98.3 F (36.8 C)  SpO2: 97% 96%   10. Thrombocytopenia             resolved 4/18 177Kr 11.  Wet voice -no sig dysphagia per SLP  12. CKD 3a- pt at baseline off IVF   BMP Latest Ref Rng & Units 04/23/2021 04/16/2021 04/10/2021  Glucose 70 - 99 mg/dL 04/18/2021) 96 06/10/2021)  BUN 8 - 23 mg/dL 086(V) 18 784(O)  Creatinine 0.61 - 1.24 mg/dL 96(E) 95(M) 8.41(L)  Sodium 135 - 145 mmol/L 138 137 138  Potassium 3.5 - 5.1 mmol/L 4.0 3.9 3.8  Chloride 98 - 111 mmol/L 105 103 102  CO2 22 - 32 mmol/L 27 27 28   Calcium 8.9 - 10.3  mg/dL 2.44(W) 8.9 1.02(V)   13. Sundowning. Discussed SVD with pt's daughter UA reviewed and negative  LOS: 17 days A FACE TO FACE EVALUATION WAS PERFORMED  Erick Colace 04/23/2021, 8:28 AM

## 2021-04-23 NOTE — Progress Notes (Signed)
Speech Language Pathology Discharge Summary  Patient Details  Name: Maxwell Rollins MRN: [id number] Date of Birth: 26-Aug-1936  Today's Date: 04/23/2021 SLP Individual Time: 0915-1000 SLP Individual Time Calculation (min): 45 min   Skilled Therapeutic Interventions: Skilled SLP intervention focused on cognition. Pt discharging home tomorrow with daughter and wife. Educated on strategies to increase attention and memory. Pt plans to use calendar and pill organizer once home with family. Reviewed medications for cva prevention at end of session. Pt will need 24 hour supervision from wife and daughter following dc home.Pt will benefit from follow up SLP services to maximize independence and reduce caregiver burden.     Patient has met 5 of 5 long term goals.  Patient to discharge at overall Min;Supervision level.  Reasons goals not met:     Clinical Impression/Discharge Summary:   Pt has made consistent gains and has met 5 out of 5 long term goals this reporting period due to improved attention, problem solving, and memory with use of strategies. Pt is currently overall Min A for basic cognition and  Care Partner:  Caregiver Able to Provide Assistance: Yes  Type of Caregiver Assistance: Cognitive;Physical  Recommendation:  Home Health SLP;24 hour supervision/assistance  Rationale for SLP Follow Up: Maximize functional communication;Maximize cognitive function and independence;Reduce caregiver burden   Equipment:     Reasons for discharge: Discharged from hospital   Patient/Family Agrees with Progress Made and Goals Achieved: Yes    Darrol Poke Chadley Dziedzic 04/23/2021, 9:49 AM

## 2021-04-23 NOTE — Plan of Care (Signed)
Bowel incontinence with toileting

## 2021-04-23 NOTE — Plan of Care (Signed)
  Problem: RH BOWEL ELIMINATION Goal: RH STG MANAGE BOWEL WITH ASSISTANCE Description: STG Manage Bowel with mod I Assistance. Outcome: Not Progressing   

## 2021-04-23 NOTE — [place] FL2 (Signed)
[place] MEDICAID FL2 LEVEL OF CARE SCREENING TOOL     IDENTIFICATION  Patient Name: Maxwell Rollins Birthdate: Apr 21, 1936 Sex: male Admission Date (Current Location): 04/06/2021  [facility] and [place] Number:  Producer, television/film/video and Address:  [facility] [place], [address], [place], [place] [address]      Provider Number:    Attending Physician Name and Address:  Erick Colace, MD  Relative Name and Phone Number:  Prince [place] [phone]    Current Level of Care: SNF Recommended Level of Care: Skilled Nursing Facility Prior Approval Number:    Date Approved/Denied:   PASRR Number:    Discharge Plan: SNF    Current Diagnoses: Patient Active Problem List   Diagnosis Date Noted  . AAA (abdominal aortic aneurysm) (HCC) 04/06/2021  . Stroke (cerebrum) (HCC) 04/06/2021  . Weakness of both lower extremities   . Essential hypertension   . Prediabetes   . Thrombocytopenia (HCC)   . Acute CVA (cerebrovascular accident) (HCC) 04/03/2021  . Dyslipidemia   . Hypertension   . Dementia (HCC)     Orientation RESPIRATION BLADDER Height & Weight     Place,Self  Normal Continent Weight: 199 lb 11.8 oz (90.6 kg) Height:  6\' 2"  (188 cm)  BEHAVIORAL SYMPTOMS/MOOD NEUROLOGICAL BOWEL NUTRITION STATUS      Continent Diet  AMBULATORY STATUS COMMUNICATION OF NEEDS Skin   Limited Assist Verbally Normal                       Personal Care Assistance Level of Assistance  Dressing,Bathing Bathing Assistance: Limited assistance   Dressing Assistance: Limited assistance     Functional Limitations Info             SPECIAL CARE FACTORS FREQUENCY  PT (By licensed PT),OT (By licensed OT),Speech therapy     PT Frequency: 5x a week OT Frequency: 5x a week     Speech Therapy Frequency: 5x a week      Contractures      Additional Factors Info  Code Status Code Status Info: FULL             Current Medications (04/23/2021):  This  is the current hospital active medication list Current Facility-Administered Medications  Medication Dose Route Frequency Provider Last Rate Last Admin  . acetaminophen (TYLENOL) tablet 325-650 mg  325-650 mg Oral Q4H PRN Love, Pamela S, PA-C      . alum & mag hydroxide-simeth (MAALOX/MYLANTA) 200-200-20 MG/5ML suspension 30 mL  30 mL Oral Q4H PRN Love, Pamela S, PA-C      . amLODipine (NORVASC) tablet 10 mg  10 mg Oral Daily 09-27-1986, MD   10 mg at 04/23/21 0911  . ascorbic acid (VITAMIN C) tablet 500 mg  500 mg Oral Daily 04/25/21, PA-C   500 mg at 04/23/21 04/25/21  . aspirin EC tablet 81 mg  81 mg Oral Daily 4818, PA-C   81 mg at 04/23/21 04/25/21  . atorvastatin (LIPITOR) tablet 40 mg  40 mg Oral QHS 5631, PA-C   40 mg at 04/22/21 1956  . bisacodyl (DULCOLAX) suppository 10 mg  10 mg Rectal Daily PRN Love, Pamela S, PA-C      . clopidogrel (PLAVIX) tablet 75 mg  75 mg Oral Daily 09-27-1986, PA-C   75 mg at 04/23/21 0911  . diphenhydrAMINE (BENADRYL) 12.5 MG/5ML elixir 12.5-25 mg  12.5-25 mg Oral Q6H PRN Love,  Evlyn Kanner, PA-C      . enoxaparin (LOVENOX) injection 40 mg  40 mg Subcutaneous Q24H Delle Reining S, PA-C   40 mg at 04/23/21 5631  . feeding supplement (ENSURE ENLIVE / ENSURE PLUS) liquid 237 mL  237 mL Oral BID BM Jacquelynn Cree, PA-C   237 mL at 04/23/21 0912  . guaiFENesin-dextromethorphan (ROBITUSSIN DM) 100-10 MG/5ML syrup 5-10 mL  5-10 mL Oral Q6H PRN Love, Pamela S, PA-C      . hydrALAZINE (APRESOLINE) tablet 25 mg  25 mg Oral Q6H PRN Love, Pamela S, PA-C      . meclizine (ANTIVERT) tablet 12.5 mg  12.5 mg Oral TID PRN Love, Pamela S, PA-C      . multivitamin with minerals tablet 1 tablet  1 tablet Oral Daily Jacquelynn Cree, PA-C   1 tablet at 04/23/21 0911  . polyethylene glycol (MIRALAX / GLYCOLAX) packet 17 g  17 g Oral Daily PRN Love, Pamela S, PA-C      . prochlorperazine (COMPAZINE) tablet 5-10 mg  5-10 mg Oral Q6H PRN Love, Pamela S, PA-C        Or  . prochlorperazine (COMPAZINE) injection 5-10 mg  5-10 mg Intramuscular Q6H PRN Love, Pamela S, PA-C       Or  . prochlorperazine (COMPAZINE) suppository 12.5 mg  12.5 mg Rectal Q6H PRN Love, Pamela S, PA-C      . sodium phosphate (FLEET) 7-19 GM/118ML enema 1 enema  1 enema Rectal Once PRN Love, Pamela S, PA-C      . traZODone (DESYREL) tablet 25 mg  25 mg Oral QHS PRN Erick Colace, MD   25 mg at 04/19/21 2036     Discharge Medications: Please see discharge summary for a list of discharge medications.  Relevant Imaging Results:  Relevant Lab Results:   Additional Information    Andria Rhein

## 2021-04-23 NOTE — Progress Notes (Signed)
Patient ID: Maxwell Rollins, male   DOB: 1936-05-08, 85 y.o.   MRN: [id number]   SW unable to locate pt PASAR due incorrect information. SW verified name, DOB and social with pt daughter. All information correct. Sw will reach out to [facility] to see if pt had a previous SNF stay, information could potentially be entered in system wrong.  **SW followed up with [facility], pt DOB was entered in system incorrect, information has been updated.  [place], [place] [phone]

## 2021-04-23 NOTE — Progress Notes (Signed)
Physical Therapy Discharge Summary  Patient Details  Name: Maxwell Rollins MRN: [id number] Date of Birth: Oct 02, 1936  Today's Date: 04/23/2021 PT Individual Time: 0800-0900 PT Individual Time Calculation (min): 60 min    Patient has met 9 of 9 long term goals due to improved activity tolerance, improved balance, increased strength, decreased pain and improved coordination.  Patient to discharge at an ambulatory level Supervision.   Patient's care partner requires assistance to provide the necessary physical assistance at discharge.  Reasons goals not met: n/a  Recommendation:  Patient will benefit from ongoing skilled PT services in home health setting to continue to advance safe functional mobility, address ongoing impairments in strength, coordination, balance, activity tolerance, cognition, safety awareness, all to minimize fall risk.  Equipment: rollator  Reasons for discharge: treatment goals met and discharge from hospital  Patient/family agrees with progress made and goals achieved: Yes  PT Discharge Precautions/Restrictions Precautions Precautions: Fall,Other (comment) Precaution Comments: Monitor BP. Posterior lean Restrictions Weight Bearing Restrictions: No Vital Signs Therapy Vitals BP: 121/87 Pain Pain Assessment Pain Scale: Faces Faces Pain Scale: No hurt Vision/Perception  Baseline Vision/History: Wears glasses Patient Visual Report: No change from baseline Vision Assessment?: No apparent visual deficits Perception  Perception: Within Functional Limits Praxis Praxis: Impaired Praxis Impairment Details: Motor planning Praxis-Other Comments: greatly improved from eval, still exhibits some difficulty motor planning transfers  Cognition Overall Cognitive Status: Impaired/Different from baseline Arousal/Alertness: Awake/alert Orientation Level: Oriented to person;Oriented to place;Oriented to time Attention: Sustained Sustained Attention:  Impaired Sustained Attention Impairment: Verbal basic;Functional basic Selective Attention: Impaired Selective Attention Impairment: Verbal basic;Functional basic Memory: Impaired Memory Impairment: Retrieval deficit;Decreased recall of new information;Decreased short term memory Decreased Short Term Memory: Verbal basic Awareness: Impaired Awareness Impairment: Emergent impairment;Intellectual impairment Problem Solving: Impaired Problem Solving Impairment: Verbal basic Executive Function: Self Monitoring;Self Correcting Initiating: Impaired Self Monitoring: Appears intact Self Correcting: Impaired Safety/Judgment: Impaired Sensation Sensation Light Touch: Appears Intact Additional Comments: Impaired BLE propioception during functional transfers, greatly improved from eval, able to self-correct minimally for improved gait/safe RW use Coordination Gross Motor Movements are Fluid and Coordinated: No Coordination and Movement Description: Rigidity in movement, generalized weakness, decreased step length/foot clearance during gait, greatly improved from eval Motor  Motor Motor: Motor apraxia Motor - Discharge Observations: generalized weakness, shuffling gait pattern, posterior bias in standing at times, greatly improved from eval  Mobility Transfers Sit to Stand: Supervision/Verbal cueing Stand to Sit: Supervision/Verbal cueing   Locomotion  Gait Ambulation: Yes Gait Assistance: Supervision/Verbal cueing; Contact Guard/Touching assist Gait Distance (Feet): 300 Feet Assistive Device: Rollator Gait Assistance Details: Tactile cues for posture; Verbal cues for technique; Verbal cues for precautions/safety; Verbal cues for safe use of DME/AE Gait Gait: Yes Gait Pattern: Step-through pattern; Trunk flexed Gait Velocity: Reduced Stairs: Yes Stairs Assistance: Contact Guard/Touching assist Stair Management Technique: Two Rails Number of Stairs: 22 Height of Stairs: 6 Ramp:  Contact Guard/touching assist (using rollator) Wheelchair Mobility: no Trunk/Postural Assessment  Cervical Assessment Cervical Assessment: Exceptions to WFL (slight forward head) Thoracic Assessment Thoracic Assessment: Exceptions to [facility] (kyphotic) Lumbar Assessment Lumbar Assessment: Exceptions to [facility] (posterior pelvic tilt) Postural Control Postural Control: Deficits on evaluation; improved since eval Balance Balance Balance Assessed: Yes Static Sitting Balance Static Sitting - Balance Support: Feet supported Static Sitting - Level of Assistance:6: Modified independent (Device/Increase time) Dynamic Sitting Balance Dynamic Sitting - Balance Support: Feet supported Dynamic Sitting - Level of Assistance: 5: Stand by assistance (S) Dynamic Sitting - Balance Activities: Forward lean/weight shifting;Lateral lean/weight shifting;Reaching across  midline;Reaching for objects Sitting balance - Comments: able to maintain balance to don/tie shoes Static Standing Balance Static Standing - Balance Support: No upper extremity supported; During functional activity Static Standing - Level of Assistance: 5: Stand by assistance (close S) Dynamic Standing Balance Dynamic Standing - Balance Support: Bilateral upper extremity supported;During functional activity Dynamic Standing - Level of Assistance: 5: Stand by assistance   Dynamic Standing - Balance Activities: Lateral lean/weight shifting; Forward lean/weight shifting; [place]; Reaching for objects; Reaching for weighted objects; Reaching across midline; Compliant surfaces Dynamic Standing - Comments: continued posterior bias demosntrated intermittently; improved corrective responses from pt Extremity Assessment    RLE Overall Strength --  RLE Overall Strength Comments --  Right Hip Flexion 4/5  Right Hip Extension 4/5  Right Hip External Rotation  --  Right Hip Internal Rotation  --  Right Hip ABduction --  Right Hip ADduction 4/5  Right  Knee Flexion 4-/5  Right Knee Extension 5/5  Right Ankle Dorsiflexion 4/5  Right Ankle Plantar Flexion 5/5      LLE Overall Strength --  LLE Overall Strength Comments --  Left Hip Flexion 4+/5  Left Hip Extension 4+/5  Left Hip External Rotation  --  Left Hip Internal Rotation  --  Left Hip ABduction 4/5  Left Hip ADduction 4+/5  Left Knee Flexion 4/5  Left Knee Extension 5/5  Left Ankle Dorsiflexion 4/5  Left Ankle Plantar Flexion 4/5     Alger Simons 04/23/2021, 12:30 PM

## 2021-04-24 DIAGNOSIS — N189 Chronic kidney disease, unspecified: Secondary | ICD-10-CM

## 2021-04-24 DIAGNOSIS — I712 Thoracic aortic aneurysm, without rupture, unspecified: Secondary | ICD-10-CM

## 2021-04-24 DIAGNOSIS — I714 Abdominal aortic aneurysm, without rupture: Secondary | ICD-10-CM

## 2021-04-24 DIAGNOSIS — I7143 Infrarenal abdominal aortic aneurysm, without rupture: Secondary | ICD-10-CM

## 2021-04-24 MED ORDER — VITAMIN B-12 1000 MCG PO TABS: 1000.0000 ug | ORAL_TABLET | Freq: Every day | ORAL | Status: DC

## 2021-04-24 MED ORDER — ADULT MULTIVITAMIN W/MINERALS CH: 1.0000 | ORAL_TABLET | Freq: Every day | ORAL | Status: DC

## 2021-04-24 MED ORDER — TRAZODONE HCL 50 MG PO TABS: 25.0000 mg | ORAL_TABLET | Freq: Every evening | ORAL | Status: DC | PRN

## 2021-04-24 MED ORDER — ENSURE ENLIVE PO LIQD: 237.0000 mL | Freq: Two times a day (BID) | ORAL | 12 refills | Status: DC

## 2021-04-24 MED ORDER — ACETAMINOPHEN 325 MG PO TABS: 325.0000 mg | ORAL_TABLET | ORAL | Status: DC | PRN

## 2021-04-24 NOTE — Progress Notes (Signed)
PROGRESS NOTE   Subjective/Complaints: Going to SNF today   ROS: Patient denies CP, SOB, N/V/D  Objective:   No results found. Recent Labs    04/23/21 0449  WBC 7.9  HGB 13.2  HCT 39.5  PLT 169   Recent Labs    04/23/21 0449  NA 138  K 4.0  CL 105  CO2 27  GLUCOSE 103*  BUN 28*  CREATININE 1.35*  CALCIUM 8.7*    Intake/Output Summary (Last 24 hours) at 04/24/2021 0732 Last data filed at 04/24/2021 0606 Gross per 24 hour  Intake 480 ml  Output 375 ml  Net 105 ml        Physical Exam: Vital Signs Blood pressure 113/65, pulse 61, temperature (!) 97.5 F (36.4 C), resp. rate 16, height 6\' 2"  (1.88 m), weight 90.6 kg, SpO2 98 %.    General: No acute distress Mood and affect are appropriate Heart: Regular rate and rhythm no rubs murmurs or extra sounds Lungs: Clear to auscultation, breathing unlabored, no rales or wheezes Abdomen: Positive bowel sounds, soft nontender to palpation, nondistended Extremities: No clubbing, cyanosis, or edema  Skin: No evidence of breakdown, no evidence of rash Neurologic: Cranial nerves II through XII intact, motor strength is 5/5 in bilateral deltoid, bicep, tricep, grip, hip flexor, knee extensors, ankle dorsiflexor and plantar flexor Sensory exam normal sensation to light touch and proprioception in bilateral upper and lower extremities  Oriented to person , day, place Musculoskeletal: Full range of motion in all 4 extremities. No joint swelling   Assessment/Plan: 1. Functional deficits due to CVA Stable for D/C today F/u PCP in 3-4 weeks F/u PM&R 2 weeks See D/C summary See D/C instructions  Care Tool:  Bathing    Body parts bathed by patient: Chest,Abdomen,Face,Left arm,Right arm   Body parts bathed by helper: Front perineal area,Buttocks,Right upper leg,Left upper leg,Right lower leg,Left lower leg     Bathing assist Assist Level: Moderate Assistance -  Patient 50 - 74%     Upper Body Dressing/Undressing Upper body dressing   What is the patient wearing?: Pull over shirt    Upper body assist Assist Level: Supervision/Verbal cueing    Lower Body Dressing/Undressing Lower body dressing      What is the patient wearing?: Pants     Lower body assist Assist for lower body dressing: Contact Guard/Touching assist     Toileting Toileting    Toileting assist Assist for toileting: Contact Guard/Touching assist     Transfers Chair/bed transfer  Transfers assist     Chair/bed transfer assist level: Contact Guard/Touching assist     Locomotion Ambulation   Ambulation assist   Ambulation activity did not occur: Safety/medical concerns  Assist level: Contact Guard/Touching assist Assistive device: Rollator Max distance: 200   Walk 10 feet activity   Assist  Walk 10 feet activity did not occur: Safety/medical concerns  Assist level: Contact Guard/Touching assist Assistive device: Rollator   Walk 50 feet activity   Assist Walk 50 feet with 2 turns activity did not occur: Safety/medical concerns  Assist level: Contact Guard/Touching assist Assistive device: Rollator    Walk 150 feet activity   Assist Walk 150  feet activity did not occur: Safety/medical concerns  Assist level: Contact Guard/Touching assist Assistive device: Rollator    Walk 10 feet on uneven surface  activity   Assist Walk 10 feet on uneven surfaces activity did not occur: Safety/medical concerns   Assist level: Contact Guard/Touching assist Assistive device: Rollator   Wheelchair     Assist Will patient use wheelchair at discharge?: No (Per PT long term goals)             Wheelchair 50 feet with 2 turns activity    Assist            Wheelchair 150 feet activity     Assist          Blood pressure 113/65, pulse 61, temperature (!) 97.5 F (36.4 C), resp. rate 16, height 6\' 2"  (1.88 m), weight 90.6  kg, SpO2 98 %.     Medical Problem List and Plan: 1.  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             -patient may shower             -ELOS/Goals:4/26 /Supervision ,             -Continue CIR therapies including PT, OT, and SLP    2.  Impaired mobility -DVT/anticoagulation:  Pharmaceutical: Continue Lovenox             -antiplatelet therapy: DAPT X 3 weeks followed by ASA alone.  3. Lumbar stenosis/Pain Management: Denies back pain. N/A 4. Mood: LCSW to follow for evaluation and support.              -antipsychotic agents: N/A 5. Neuropsych: This patient is ?not fully capable of making decisions on his own behalf. 6. Skin/Wound Care: Routine pressure relief measures 7. Fluids/Electrolytes/Nutrition: Monitor I/Os             CMP ordered for Monday 8. Dyslipidemia              High dose statin. 9. HTN: Monitor BP TID             Norvasc 10mg             4/26 controlled  Vitals:   04/23/21 2033 04/24/21 0535  BP: 120/75 113/65  Pulse: 71 61  Resp: 18 16  Temp: 98.4 F (36.9 C) (!) 97.5 F (36.4 C)  SpO2: 98% 98%   10. Thrombocytopenia             resolved 4/18 177Kr 11.  Wet voice -no sig dysphagia per SLP  12. CKD 3a- pt at baseline off IVF   BMP Latest Ref Rng & Units 04/23/2021 04/16/2021 04/10/2021  Glucose 70 - 99 mg/dL 04/18/2021) 96 06/10/2021)  BUN 8 - 23 mg/dL 696(E) 18 952(W)  Creatinine 0.61 - 1.24 mg/dL 41(L) 24(M) 0.10(U)  Sodium 135 - 145 mmol/L 138 137 138  Potassium 3.5 - 5.1 mmol/L 4.0 3.9 3.8  Chloride 98 - 111 mmol/L 105 103 102  CO2 22 - 32 mmol/L 27 27 28   Calcium 8.9 - 10.3 mg/dL 7.25(D) 8.9 6.64(Q)   13. Sundowning. Discussed SVD with pt's daughter UA reviewed and negative   LOS: 18 days A FACE TO FACE EVALUATION WAS PERFORMED  04/24/2021, 7:32 AM

## 2021-04-24 NOTE — Discharge Summary (Signed)
Physician Discharge Summary  Patient ID: Maxwell Rollins MRN: [id number] DOB/AGE: 1936-05-14 85 y.o.  Admit date: 04/06/2021 Discharge date: 04/24/2021  Discharge Diagnoses:  Principal Problem:   Acute CVA (cerebrovascular accident) [facility]) Active Problems:   Dyslipidemia   Hypertension   Essential hypertension   Stroke (cerebrum) (HCC)   Chronic kidney disease   Aneurysm of thoracic aorta (HCC)   Aneurysm of infrarenal abdominal aorta (HCC)   Discharged Condition: stable  Significant Diagnostic Studies: N/A   Labs:  Basic Metabolic Panel: BMP Latest Ref Rng & Units 04/23/2021 04/16/2021 04/10/2021  Glucose 70 - 99 mg/dL 706(C) 96 376(E)  BUN 8 - 23 mg/dL 83(T) 18 51(V)  Creatinine 0.61 - 1.24 mg/dL 6.16(W) 7.37(T) 0.62(I)  Sodium 135 - 145 mmol/L 138 137 138  Potassium 3.5 - 5.1 mmol/L 4.0 3.9 3.8  Chloride 98 - 111 mmol/L 105 103 102  CO2 22 - 32 mmol/L 27 27 28   Calcium 8.9 - 10.3 mg/dL ) 8.9 9.4(W)    CBC: CBC Latest Ref Rng & Units 04/23/2021 04/16/2021 04/09/2021  WBC 4.0 - 10.5 K/uL 7.9 7.9 9.8  Hemoglobin 13.0 - 17.0 g/dL 06/09/2021 27.0 35.0  Hematocrit 39.0 - 52.0 % 39.5 40.9 42.5  Platelets 150 - 400 K/uL 169 177 155    CBG: No results for input(s): GLUCAP in the last 168 hours.  Brief HPI:   Maxwell Rollins is a 85 y.o. male with history of dyslipidemia, COVID-19 2021 with resultant dizziness and balance problems, 3-week history of weakness who was admitted on 04/oh.  22 with BLE numbness, back pain and inability to walk.  Patient noted to have confusion with question history of dementia.  MRI/MRI brain done revealing punctate infarcts in the mesial left temporal lobe and moderately advanced age-related cerebral atrophy.  MRI lumbar spine showed multifactoral degenerative changes L4-L5 with mild spinal stenosis and moderate right left L4 foraminal stenosis as well as L5 on S1 anterolisthesis as well as large multilobulated aneurysm.  Stroke was felt to be  due to small vessel disease.   Dr. 03-27-1973 recommended DAPT x3 weeks followed by aspirin alone and lower extremity weakness felt to be due to lumbar stenosis. CTA chest/abdomen done for work-up and showed ectatic thoracic aorta at 3.9 cm and try lobe juxtarenal/infrarenal AAA of 6.2 cm with angulations and no evidence of rupture or impending rupture.  Dr. Roda Shutters consulted for input on aneurysm and felt that patient would need consideration for elective repair and eventual referral to [facility] for off label graft versus potential open repair.  His office will assist with setting this post discharge.  Patient continued to have impairments due weakness, difficulty with mobility as well as cognitive deficits affecting functional status.  CIR was recommended due to functional decline.   Hospital Course: Tyr Franca was admitted to rehab 04/06/2021 for inpatient therapies to consist of PT, ST and OT at least three hours five days a week. Past admission physiatrist, therapy team and rehab RN have worked together to provide customized collaborative inpatient rehab. he was maintained on DAPT during his stay and has completed 3 weeks course therefore Plavix was discontinued at discharge.CBC at admission showed that thrombocytopenia had resolved and H/H is relatively stable. His blood pressures were monitored on TID basis and noted to be labile therefore Norvasc was titrated up to 10 mg with good control.     Acute on chronic renal failure has been monitored with serial checks and is currently at baseline but he continues to  require encouragement to increase fluid intake.  Blood sugars were monitored on ac/hs CBG checks briefly due to prediabetes.  His p.o. intake has been good and these are reasonably controlled ranging from 100-120 range therefore CBG checks were discontinued.  He has been making steady progress but continues to be limited by cognitive deficits, posterior bias with standing, fatigue as well as  poor safety awareness.  Family has elected on SNF and he was discharged to [facility] on 04/24/21.   Rehab course: During patient's stay in rehab weekly team conferences were held to monitor patient's progress, set goals and discuss barriers to discharge. At admission, patient required max to total assist with ADLs and max assist with PT.  He exhibited moderate cognitive deficits with hoarse wet voice but without overt s/s of aspiration. He has had improvement in activity tolerance, balance, postural control as well as ability to compensate for deficits.He requires contact-guard assist to supervision for safety to complete ADL tasks. He requires contact-guard assist to close supervision for transfers and to ambulate up to 250 feet with rolling walker.  He does continue to be limited by fatigue and tendency to drag his toe with longer walks.  He has made consistent gains with improvement in attention, problem-solving and memory with use of strategies.  He requires min assist with basic cognitive tasks.  Discharge disposition: 03-Skilled Nursing Facility  Diet: Heart Healthy  Special Instructions: 1. Encourage fluid intake. Monitor renal status with serial checks. 2. Appointment on 05/10/21 with Dr. Webb Silversmith. [address]  [address] [place], [place] [address]. [phone]. Call for time and directions.   Discharge Instructions    Ambulatory referral to Physical Medicine Rehab   Complete by: As directed    4 weeks follow up     Allergies as of 04/24/2021      Reactions   Contrast Media [iodinated Diagnostic Agents]    Other reaction(s): rash/itching   Red Dye       Medication List    STOP taking these medications   VITAMIN B-6 PO   Vitamin D (Ergocalciferol) 1.25 MG (50000 UNIT) Caps capsule Commonly known as: DRISDOL     TAKE these medications   acetaminophen 325 MG tablet Commonly known as: TYLENOL Take 1-2 tablets (325-650 mg total) by  mouth every 4 (four) hours as needed for mild pain.   amLODipine 10 MG tablet Commonly known as: NORVASC Take 1 tablet (10 mg total) by mouth daily.   aspirin 81 MG EC tablet Take 1 tablet (81 mg total) by mouth daily. Swallow whole.   atorvastatin 40 MG tablet Commonly known as: LIPITOR Take 1 tablet (40 mg total) by mouth at bedtime.   clopidogrel 75 MG tablet Commonly known as: PLAVIX Take 1 tablet (75 mg total) by mouth daily for 18 days.   feeding supplement Liqd Take 237 mLs by mouth 2 (two) times daily between meals.   meclizine 25 MG tablet Commonly known as: ANTIVERT Take 12.5 mg by mouth 3 (three) times daily as needed for dizziness or nausea.   multivitamin with minerals Tabs tablet Take 1 tablet by mouth daily.   traZODone 50 MG tablet Commonly known as: DESYREL Take 0.5 tablets (25 mg total) by mouth at bedtime as needed for sleep.   vitamin B-12 1000 MCG tablet Commonly known as: CYANOCOBALAMIN Take 1 tablet (1,000 mcg total) by mouth daily. What changed: how much to take   vitamin C 500 MG tablet Commonly known  as: ASCORBIC ACID Take 500 mg by mouth daily.       Contact information for follow-up providers    Kirsteins, Victorino Sparrow, MD Follow up.   Specialty: Physical Medicine and Rehabilitation Why: office will call you with follow up appointment Contact information: [address] [place] [address] [phone]        Elise Benne, MD Follow up.   Specialty: Internal Medicine Why: for post hospital follow up Contact information: [address] [place] [address] [phone]        Larina Earthly, MD. Call.   Specialties: Vascular Surgery, Cardiology Why: for follow up on aneurysm/Referral to [facility] information: [address] [place] [address] [phone]        [name]. Call.   Why: for follow up appt Contact information: [address]     [address] [place]  [address] [phone]           Contact information for after-discharge care    Destination    [facility] .   Service: Skilled Nursing Contact information: [address] [place] [address] [phone]                  Signed: Jacquelynn Cree 04/24/2021, 9:38 AM

## 2021-04-24 NOTE — Plan of Care (Signed)
Pt to D/C to SNF facility w/ daughter to transport via private car. Pt vitals WNL, pt denies any concerns at this time. Breathing even and unlabored, no SOB noted. Belongings taken down with patient and daughter. Staff transported via wheelchair to lobby.

## 2021-04-24 NOTE — Progress Notes (Signed)
Inpatient Rehabilitation Care Coordinator Discharge Note  The overall goal for the admission was met for:   Discharge location: Yes, SNF [facility])  Length of Stay: Yes, 18 days  Discharge activity level: Yes, CGA/SUP  Home/community participation: Yes  Services provided included: MD, RD, PT, OT, SLP, RN, CM, TR, Pharmacy, Neuropsych and SW  Financial Services: Medicare  Choices offered to/list presented to:Pt daughter and spouse  Follow-up services arranged: Other: PT/OT/ST continue at SNF  Comments (or additional information):  Patient/Family verbalized understanding of follow-up arrangements: Yes  Individual responsible for coordination of the follow-up plan: [name] [phone]  Confirmed correct DME delivered: [name] 04/24/2021    [name]

## 2021-04-26 NOTE — Progress Notes (Signed)
Patient ID: Maxwell Rollins, male   DOB: Mar 16, 1936, 85 y.o.   MRN: [id number]   Pt CD imagines dropped off to mailroom to be mailed out to pt family for upcoming appointment.   [place], [place] [phone]

## 2021-05-07 ENCOUNTER — Ambulatory Visit (INDEPENDENT_AMBULATORY_CARE_PROVIDER_SITE_OTHER): Payer: Self-pay | Admitting: Gastroenterology

## 2021-05-07 ENCOUNTER — Telehealth (INDEPENDENT_AMBULATORY_CARE_PROVIDER_SITE_OTHER): Payer: Self-pay

## 2021-05-07 ENCOUNTER — Encounter (INDEPENDENT_AMBULATORY_CARE_PROVIDER_SITE_OTHER): Payer: Self-pay | Admitting: *Deleted

## 2021-05-07 NOTE — Telephone Encounter (Signed)
Patient no showed for appointment with Dr. Levon Hedger on 05/07/2021 at [facility]. Patient referred by Dr. Corrie Mckusick.

## 2021-05-22 ENCOUNTER — Encounter: Payer: Medicare Other | Attending: Physical Medicine & Rehabilitation | Admitting: Physical Medicine & Rehabilitation

## 2021-05-30 ENCOUNTER — Encounter: Payer: Self-pay | Admitting: Adult Health

## 2021-05-30 ENCOUNTER — Ambulatory Visit (INDEPENDENT_AMBULATORY_CARE_PROVIDER_SITE_OTHER): Payer: Medicare Other | Admitting: Adult Health

## 2021-05-30 VITALS — BP 109/73 | HR 62

## 2021-05-30 DIAGNOSIS — I639 Cerebral infarction, unspecified: Secondary | ICD-10-CM | POA: Diagnosis not present

## 2021-05-30 DIAGNOSIS — M48062 Spinal stenosis, lumbar region with neurogenic claudication: Secondary | ICD-10-CM

## 2021-05-30 DIAGNOSIS — I10 Essential (primary) hypertension: Secondary | ICD-10-CM

## 2021-05-30 DIAGNOSIS — F0390 Unspecified dementia without behavioral disturbance: Secondary | ICD-10-CM | POA: Diagnosis not present

## 2021-05-30 DIAGNOSIS — E785 Hyperlipidemia, unspecified: Secondary | ICD-10-CM

## 2021-05-30 DIAGNOSIS — I714 Abdominal aortic aneurysm, without rupture, unspecified: Secondary | ICD-10-CM

## 2021-05-30 DIAGNOSIS — R42 Dizziness and giddiness: Secondary | ICD-10-CM

## 2021-05-30 NOTE — Progress Notes (Signed)
[name] [address] [place]. [place] [address] [phone]       HOSPITAL FOLLOW UP NOTE  [name] Date of Birth:  Dec 21, 1936 Medical Record Number:  [id number]   Reason for Referral:  hospital stroke follow up    SUBJECTIVE:   CHIEF COMPLAINT:  Chief Complaint  Patient presents with  . Follow-up    RM 14 with daughter (wylanta) Pt is having increased dizziness, has sun downers and wondered out of the facility this morning, weakness in legs     HPI:   Maxwell Rollins a 85 y.o.malewith [facility], hypertension(patient denies this and reports he is not on any antihypertensive medications), former smoking, numbness in the feet, vertigo, stage III Chronic kidney disease, dementia(which patient denies) who presented on 04/02/2021 with lightheadedness and whole body weakness. CT no acute abnormality.  MRI showed left punctate hippocampal infarct.  MRA head, Doppler, 2D echo, LE venous Doppler all unremarkable.  LDL 110, A1c 5.8, UDS negative, creatinine 1.5.  CT abdomen pelvis showed large AAA 6.2 cm, vascular surgery consulted who recommended referral to [facility] for further evaluation.  Stroke felt to be incidental as it does not correlate with presenting symptoms.  Lower extremity weakness felt to be due to lumbar stenosis with MRI lumbar spine multifactoral degenerative changes L4-L5 with mild spinal stenosis and moderate right > left  L4 foraminal stenosis as well as L5 on S1 anterolisthesis.  Symptoms concerning for presyncope recommended further evaluation by medical team.  Recommended DAPT for 3 weeks and aspirin alone.  LDL 110 and increase home dose atorvastatin 20 mg to 40 mg daily.  Other stroke risk factors include advanced age and former tobacco use but no prior stroke history.  Other active problems include CKD and history of COVID 12/2019 with residual persistent  dizziness.  Evaluated by therapies who recommended discharge to CIR for impairments due to weakness, difficulty with mobility as well as cognitive deficits affecting functional status.  He is eventually discharged to SNF due to continued limitation in setting of cognitive deficits, gait impairment, impaired postural control and poor safety awareness.   Today, 05/30/2021, Maxwell Rollins is being seen for hospital follow-up accompanied by his daughter, Maxwell Rollins and friend Maxwell Rollins via telephone.  He continues to reside at [facility] and rehabilitation center in [place], [place] where he continues to receive PT/OT/SLP for residual lower extremity weakness, gait impairment and cognitive impairment.  Daughter does report improvement since discharge.  He is ambulating short distances with a rolling walker and denies any recent falls.  Denies new stroke/TIA symptoms.  Completed 3 weeks DAPT and remains on aspirin alone as well as atorvastatin 40 mg daily without associated side effects.  Blood pressure today 109/73.  Other concerns:  Back pain: Chronic issue.  Has never been seen by neurosurgery or orthopedics.  He has been seen by a chiropractor for "years" per daughter.  He does endorse symptoms of radiculopathy in right leg.  He will experience increased right leg pain with increased ambulation and will resolve shortly after resting.  Denies bilateral lower extremity numbness/tingling or painful paresthesias.  Vertigo/dizziness: per daughter, this has been present since he had a fall 2 years ago in [place].  He was evaluated at local hospital without any intracranial injury but did require sutures in the back of his head.  This has been relatively stable until he was diagnosed with COVID 12/2019 with reported worsening.  He apparently was advised  to follow-up with neurology after COVID admission for further evaluation but this was never pursued.  Daughter is unsure of exact reasoning for neurology evaluation -  she believes possibly due to gait impairment with vertigo. Vertigo can be present while ambulating, position changes or quick head movements  Cognitive impairment: Present since COVID 12/2019 with occasional word finding difficulty with worsening after recent admission.  He can have confusion in the evening and even this morning tried to leave the facility alone to go to todays appointment.  He was recently started on Namenda XR 14 mg daily by SNF.  No known family history of dementia  AAA: Evaluated at [facility] vascular surgery on 05/10/2021 with OV personally reviewed. Per note, discussed treatment options including medical management versus surgical intervention and patient was not interested in repair given associated risks especially given risk of worsening dementia and recent stroke.  No follow-up recommended    ROS:   14 system review of systems performed and negative with exception of those listed in HPI  PMH:  Past Medical History:  Diagnosis Date  . COVID-19 2019   Was hospitalized/ has had dizzines on and off since  . Dementia (HCC)   . Dyslipidemia   . Hypertension     PSH:  Past Surgical History:  Procedure Laterality Date  . NASAL POLYP EXCISION     Surgeries X 5 for removal    Social History:  Social History   Socioeconomic History  . Marital status: Married    Spouse name: Not on file  . Number of children: Not on file  . Years of education: Not on file  . Highest education level: Not on file  Occupational History  . Occupation: retired  Tobacco Use  . Smoking status: Never Smoker  . Smokeless tobacco: Never Used  Substance and Sexual Activity  . Alcohol use: Never  . Drug use: Never  . Sexual activity: Not on file  Other Topics Concern  . Not on file  Social History Narrative  . Not on file   Social Determinants of Health   Financial Resource Strain: Not on file  Food Insecurity: Not on file  Transportation Needs: Not on file  Physical  Activity: Not on file  Stress: Not on file  Social Connections: Not on file  Intimate Partner Violence: Not on file    Family History:  Family History  Problem Relation Age of Onset  . Heart disease Mother     Medications:   Current Outpatient Medications on File Prior to Visit  Medication Sig Dispense Refill  . acetaminophen (TYLENOL) 325 MG tablet Take 1-2 tablets (325-650 mg total) by mouth every 4 (four) hours as needed for mild pain.    Marland Kitchen amLODipine (NORVASC) 10 MG tablet Take 1 tablet (10 mg total) by mouth daily. 30 tablet 0  . aspirin EC 81 MG EC tablet Take 1 tablet (81 mg total) by mouth daily. Swallow whole. 30 tablet 0  . atorvastatin (LIPITOR) 40 MG tablet Take 1 tablet (40 mg total) by mouth at bedtime. 30 tablet 0  . feeding supplement (ENSURE ENLIVE / ENSURE PLUS) LIQD Take 237 mLs by mouth 2 (two) times daily between meals. 237 mL 12  . meclizine (ANTIVERT) 25 MG tablet Take 12.5 mg by mouth 3 (three) times daily as needed for dizziness or nausea.    . Multiple Vitamin (MULTIVITAMIN WITH MINERALS) TABS tablet Take 1 tablet by mouth daily.    . traZODone (DESYREL) 50 MG tablet  Take 0.5 tablets (25 mg total) by mouth at bedtime as needed for sleep.    . vitamin B-12 (CYANOCOBALAMIN) 1000 MCG tablet Take 1 tablet (1,000 mcg total) by mouth daily.    . vitamin C (ASCORBIC ACID) 500 MG tablet Take 500 mg by mouth daily.     No current facility-administered medications on file prior to visit.    Allergies:   Allergies  Allergen Reactions  . Contrast Media [Iodinated Diagnostic Agents]     Other reaction(s): rash/itching  . Red Dye       OBJECTIVE:  Physical Exam  Vitals:   05/30/21 1320  BP: 109/73  Pulse: 62   There is no height or weight on file to calculate BMI. No exam data present  General: well developed, well nourished,  very pleasant elderly Caucasian male, seated, in no evident distress Head: head normocephalic and atraumatic.   Neck: supple  with no carotid or supraclavicular bruits Cardiovascular: regular rate and rhythm, no murmurs Musculoskeletal: no deformity Skin:  no rash/petichiae Vascular:  Normal pulses all extremities   Neurologic Exam Mental Status: Awake and fully alert.   Fluent speech and language.  Disoriented to place and time. Recent memory impaired and remote memory intact. Attention span, concentration and fund of knowledge impaired with daughter providing majority of history. Mood and affect appropriate.  Cranial Nerves: Fundoscopic exam reveals sharp disc margins. Pupils equal, briskly reactive to light. Extraocular movements full without nystagmus. Visual fields full to confrontation. Hearing intact. Facial sensation intact. Face, tongue, palate moves normally and symmetrically.  Motor: Normal bulk and tone. Normal strength in all tested extremity muscles except right foot drop noted Sensory.:  Intact upper extremity sensation bilaterally. R>L LE decreased pinprick, light touch and vibratory sensation distally Coordination: Rapid alternating movements normal in all extremities. Finger-to-nose and heel-to-shin performed accurately left leg with difficulty performing with right leg.  No evidence of action or resting tremors, cogwheel rigidity or bradykinesia. Gait and Station: Deferred as RW not present Reflexes: 1+ and symmetric. Toes downgoing.     NIHSS  2 (LOC question and sensation impairment although likely due to lower back issues) Modified Rankin  3-4      ASSESSMENT: Maxwell Rollins Forness is a 85 y.o. year old male with recent incidental finding of left small hippocampal infarct likely secondary to small vessel disease on 04/02/2021 after initial presentation of lightheadedness and whole body weakness.  Also found to have large AAA and [facility].  Vascular risk factors include HTN, large AAA, advanced age, COVID-19 12/2019 with residual persistent dizziness and CKD.      PLAN:  1. L  hippocampal infarct, incidental finding:  a. Continue participating with SNF PT/OT/SLP.   b. Continue aspirin 81 mg daily  and atorvastatin 40 mg daily for secondary stroke prevention.   c. Discussed secondary stroke prevention measures and importance of close PCP follow up for aggressive stroke risk factor management  2. HTN: BP goal <130/90.  Stable on current regimen per PCP 3. HLD: LDL goal <70. Recent LDL 110 -currently on atorvastatin 40 mg daily.  4. AAA: dx'd 03/2021.  [facility] -patient declined surgical intervention due to increased risk was advised to follow-up on an as-needed basis.  5. Dementia: Likely multifactorial with history of COVID and recent stroke.  Recently started on Namenda by facility which was recommended to continue.  Continue participation with SLP and cognitive therapy.  Obtain dementia panel today. Will plan to formally assess with MMSE  at follow-up visit 6. Vertigo: per daughter, present since a fall 2 years ago. ?  Postconcussive syndrome vs BPPV vs etc.  May consider vestibular rehab once current PT completed and further strengthening of BLE.  7. Lumbar stenosis: Chronic issue.  Possibly contributing to gait impairment and BLE weakness. Noted right foot drop on exam with reported radiculopathy symptoms with sensory changes and possible neurogenic claudication.  Referral placed to neurosurgery for further evaluation    Follow up in 3 months or call earlier if needed   CC:  [facility] provider: Dr. Pearlean Brownie PCP: Elise Benne, MD    I spent 64 minutes of face-to-face and non-face-to-face time with patient and daughter and friend via telephone.  This included previsit chart review including recent hospitalization pertinent progress notes, lab work and imaging, order entry, electronic health record documentation, and extensive daughter and patient education and discussion regarding recent stroke including etiologies, secondary stroke prevention measures and  importance of aggressive stroke risk factor management as well as risk of recurrent stroke, and multiple other concerns regarding dementia, vertigo, lumbar stenosis and AAA (see above) and answered all other questions to patient and daughter satisfaction  Ihor Austin, AGNP-BC  [facility] [address] [place], [place] [address]  Phone [phone] Fax [phone] Note: This document was prepared with digital dictation and possible smart phrase technology. Any transcriptional errors that result from this process are unintentional.

## 2021-05-30 NOTE — Patient Instructions (Addendum)
Continue working with therapies at [facility] - consider use of AFO brace for right foot drop  Referral placed to Neurosurgery for further evaluation of leg weakness and right foot drop  We will check lab work today to evaluate for reversible causes of memory loss   Continue aspirin 81 mg daily  and atorvastatin  for secondary stroke prevention  Continue to follow up with PCP regarding cholesterol and blood pressure management  Maintain strict control of hypertension with blood pressure goal below 130/90 and cholesterol with LDL cholesterol (bad cholesterol) goal below 70 mg/dL.      Followup in the future with me in 3 months or call earlier if needed      Thank you for coming to see [place] at [facility]. I hope we have been able to provide you high quality care today.  You may receive a patient satisfaction survey over the next few weeks. We would appreciate your feedback and comments so that we may continue to improve ourselves and the health of our patients.

## 2021-05-31 ENCOUNTER — Telehealth: Payer: Self-pay | Admitting: Adult Health

## 2021-05-31 LAB — DEMENTIA PANEL
Homocysteine: 12 umol/L (ref 0.0–21.3)
RPR Ser Ql: NONREACTIVE
TSH: 1.08 u[IU]/mL (ref 0.450–4.500)
Vitamin B-12: 759 pg/mL (ref 232–1245)

## 2021-05-31 NOTE — Telephone Encounter (Signed)
Referral for Dr. Venetia Maxon @ [facility] faxed to [phone]. Phone: [phone].

## 2021-06-04 ENCOUNTER — Telehealth: Payer: Self-pay

## 2021-06-04 NOTE — Telephone Encounter (Signed)
-----   Message from Ihor Austin, NP sent at 05/31/2021  4:38 PM EDT ----- Please advise patient/family that recent dementia panel was satisfactory

## 2021-06-04 NOTE — Progress Notes (Signed)
I agree with the above plan 

## 2021-06-04 NOTE — Telephone Encounter (Signed)
Sent pt/fmailty a message Via MyChart stating Hi Goodmorning Maxwell Rollins, I just wanted to let you know that recent dementia panel was satisfactory and are no concerns at the time. If you have any questions, please let us know. Have a great day!

## 2021-09-11 ENCOUNTER — Ambulatory Visit: Payer: Medicare Other | Admitting: Adult Health

## 2021-09-18 ENCOUNTER — Ambulatory Visit: Payer: Medicare Other | Admitting: Adult Health

## 2021-09-26 ENCOUNTER — Emergency Department (HOSPITAL_COMMUNITY): Payer: Medicare Other

## 2021-09-26 ENCOUNTER — Emergency Department (HOSPITAL_COMMUNITY)
Admission: EM | Admit: 2021-09-26 | Discharge: 2021-09-26 | Disposition: A | Discharge status: Home / Self Care | Payer: Medicare Other | Attending: Emergency Medicine | Admitting: Emergency Medicine

## 2021-09-26 ENCOUNTER — Encounter (HOSPITAL_COMMUNITY): Payer: Self-pay | Admitting: *Deleted

## 2021-09-26 ENCOUNTER — Other Ambulatory Visit: Payer: Self-pay

## 2021-09-26 DIAGNOSIS — F0390 Unspecified dementia without behavioral disturbance: Secondary | ICD-10-CM | POA: Insufficient documentation

## 2021-09-26 DIAGNOSIS — Z8616 Personal history of COVID-19: Secondary | ICD-10-CM | POA: Diagnosis not present

## 2021-09-26 DIAGNOSIS — N189 Chronic kidney disease, unspecified: Secondary | ICD-10-CM | POA: Insufficient documentation

## 2021-09-26 DIAGNOSIS — Z20822 Contact with and (suspected) exposure to covid-19: Secondary | ICD-10-CM | POA: Diagnosis not present

## 2021-09-26 DIAGNOSIS — Z7982 Long term (current) use of aspirin: Secondary | ICD-10-CM | POA: Diagnosis not present

## 2021-09-26 DIAGNOSIS — Z79899 Other long term (current) drug therapy: Secondary | ICD-10-CM | POA: Diagnosis not present

## 2021-09-26 DIAGNOSIS — I129 Hypertensive chronic kidney disease with stage 1 through stage 4 chronic kidney disease, or unspecified chronic kidney disease: Secondary | ICD-10-CM | POA: Diagnosis not present

## 2021-09-26 DIAGNOSIS — R4182 Altered mental status, unspecified: Secondary | ICD-10-CM | POA: Diagnosis not present

## 2021-09-26 DIAGNOSIS — R4781 Slurred speech: Secondary | ICD-10-CM | POA: Insufficient documentation

## 2021-09-26 DIAGNOSIS — R531 Weakness: Secondary | ICD-10-CM | POA: Insufficient documentation

## 2021-09-26 DIAGNOSIS — R410 Disorientation, unspecified: Secondary | ICD-10-CM

## 2021-09-26 LAB — COMPREHENSIVE METABOLIC PANEL
ALT: 25 U/L (ref 0–44)
AST: 19 U/L (ref 15–41)
Albumin: 3 g/dL — ABNORMAL LOW (ref 3.5–5.0)
Alkaline Phosphatase: 132 U/L — ABNORMAL HIGH (ref 38–126)
Anion gap: 9 (ref 5–15)
BUN: 19 mg/dL (ref 8–23)
CO2: 26 mmol/L (ref 22–32)
Calcium: 8.9 mg/dL (ref 8.9–10.3)
Chloride: 102 mmol/L (ref 98–111)
Creatinine, Ser: 1.3 mg/dL — ABNORMAL HIGH (ref 0.61–1.24)
GFR, Estimated: 55 mL/min — ABNORMAL LOW (ref >60)
Glucose, Bld: 85 mg/dL (ref 70–99)
Potassium: 4.1 mmol/L (ref 3.5–5.1)
Sodium: 137 mmol/L (ref 135–145)
Total Bilirubin: 1.3 mg/dL — ABNORMAL HIGH (ref 0.3–1.2)
Total Protein: 6.1 g/dL — ABNORMAL LOW (ref 6.5–8.1)

## 2021-09-26 LAB — URINALYSIS, ROUTINE W REFLEX MICROSCOPIC
Bilirubin Urine: NEGATIVE
Glucose, UA: NEGATIVE mg/dL
Hgb urine dipstick: NEGATIVE
Ketones, ur: 20 mg/dL — AB
Leukocytes,Ua: NEGATIVE
Nitrite: NEGATIVE
Protein, ur: NEGATIVE mg/dL
Specific Gravity, Urine: 1.024 (ref 1.005–1.030)
pH: 5 (ref 5.0–8.0)

## 2021-09-26 LAB — CBC
HCT: 44.5 % (ref 39.0–52.0)
Hemoglobin: 13.9 g/dL (ref 13.0–17.0)
MCH: 28.2 pg (ref 26.0–34.0)
MCHC: 31.2 g/dL (ref 30.0–36.0)
MCV: 90.3 fL (ref 80.0–100.0)
Platelets: 237 10*3/uL (ref 150–400)
RBC: 4.93 MIL/uL (ref 4.22–5.81)
RDW: 15.5 % (ref 11.5–15.5)
WBC: 11.1 10*3/uL — ABNORMAL HIGH (ref 4.0–10.5)
nRBC: 0 % (ref 0.0–0.2)

## 2021-09-26 LAB — I-STAT CHEM 8, ED
BUN: 22 mg/dL (ref 8–23)
Calcium, Ion: 1.15 mmol/L (ref 1.15–1.40)
Chloride: 103 mmol/L (ref 98–111)
Creatinine, Ser: 1.2 mg/dL (ref 0.61–1.24)
Glucose, Bld: 83 mg/dL (ref 70–99)
HCT: 43 % (ref 39.0–52.0)
Hemoglobin: 14.6 g/dL (ref 13.0–17.0)
Potassium: 4 mmol/L (ref 3.5–5.1)
Sodium: 139 mmol/L (ref 135–145)
TCO2: 25 mmol/L (ref 22–32)

## 2021-09-26 LAB — PROTIME-INR
INR: 1.2 (ref 0.8–1.2)
Prothrombin Time: 14.8 seconds (ref 11.4–15.2)

## 2021-09-26 LAB — DIFFERENTIAL
Abs Immature Granulocytes: 0.07 10*3/uL (ref 0.00–0.07)
Basophils Absolute: 0.1 10*3/uL (ref 0.0–0.1)
Basophils Relative: 1 %
Eosinophils Absolute: 0.2 10*3/uL (ref 0.0–0.5)
Eosinophils Relative: 2 %
Immature Granulocytes: 1 %
Lymphocytes Relative: 14 %
Lymphs Abs: 1.6 10*3/uL (ref 0.7–4.0)
Monocytes Absolute: 1 10*3/uL (ref 0.1–1.0)
Monocytes Relative: 9 %
Neutro Abs: 8.2 10*3/uL — ABNORMAL HIGH (ref 1.7–7.7)
Neutrophils Relative %: 73 %

## 2021-09-26 LAB — RAPID URINE DRUG SCREEN, HOSP PERFORMED
Amphetamines: NOT DETECTED
Barbiturates: NOT DETECTED
Benzodiazepines: NOT DETECTED
Cocaine: NOT DETECTED
Opiates: NOT DETECTED
Tetrahydrocannabinol: NOT DETECTED

## 2021-09-26 LAB — ETHANOL: Alcohol, Ethyl (B): <10 mg/dL (ref <10)

## 2021-09-26 LAB — APTT: aPTT: 37 seconds — ABNORMAL HIGH (ref 24–36)

## 2021-09-26 LAB — RESP PANEL BY RT-PCR (FLU A&B, COVID) ARPGX2
Influenza A by PCR: NEGATIVE
Influenza B by PCR: NEGATIVE
SARS Coronavirus 2 by RT PCR: NEGATIVE

## 2021-09-26 IMAGING — CT CT HEAD W/O CM
4 series · 17 of 47 positions shown, 19 images · non-contrast
Comparison: MRI brain dated [DATE].  CT head dated [DATE].

CLINICAL DATA: Altered mental status

EXAM:
CT HEAD WITHOUT CONTRAST
TECHNIQUE: Contiguous axial images were obtained from the base of the skull
through the vertex without intravenous contrast.

[Series 3: head bone · axial · 0.46mm/px · z∈[-218,-156]mm · 4 of 91 slices shown]
[im 10/91  bone]
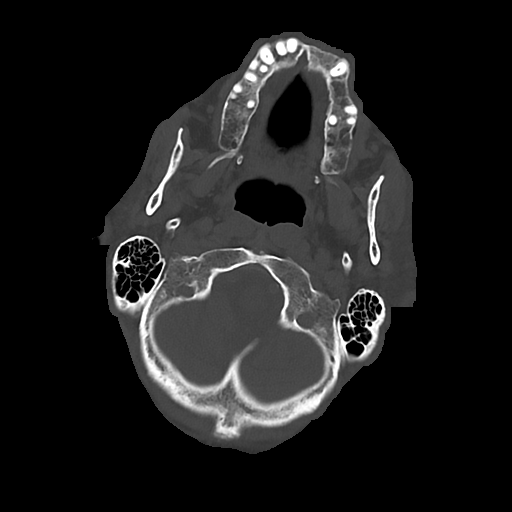
[im 19/91  bone]
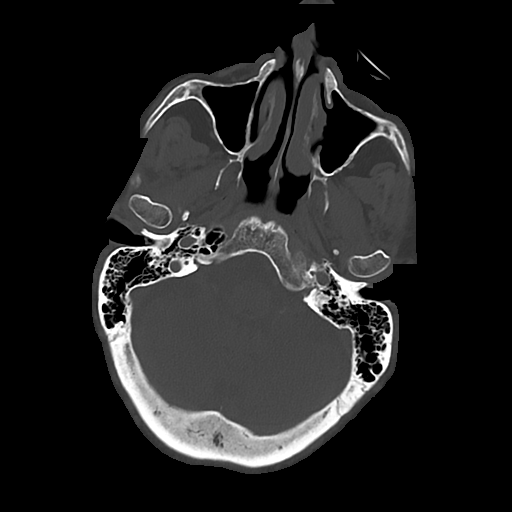
[im 28/91  bone]
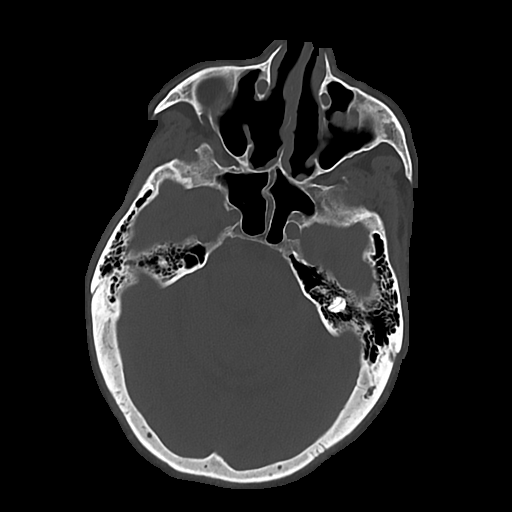
[im 41/91  bone]
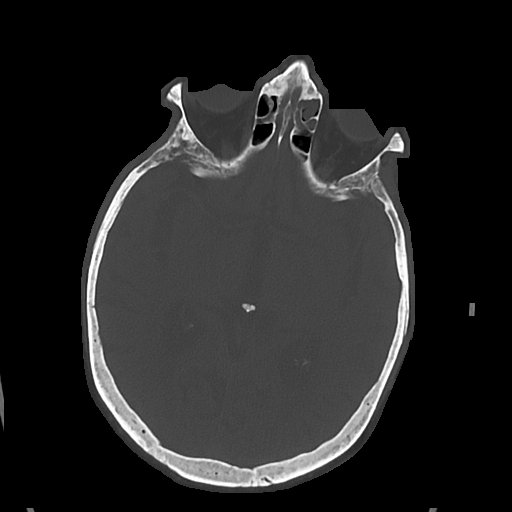

[Series 4: head wo · axial · 0.46mm/px · z∈[-216,-81]mm · 7 of 37 slices shown, 9 images]
[im 5/37  brain]
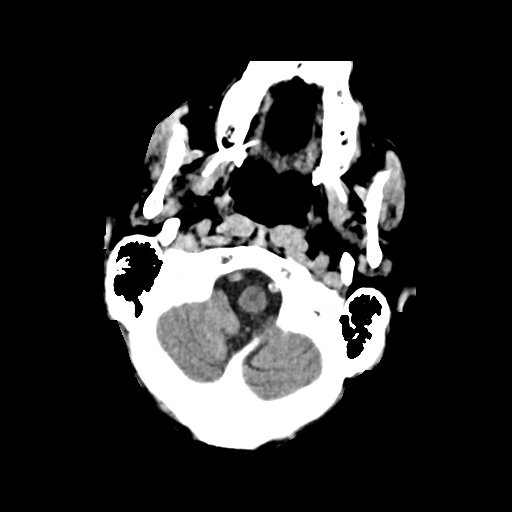
[im 5/37  bone]
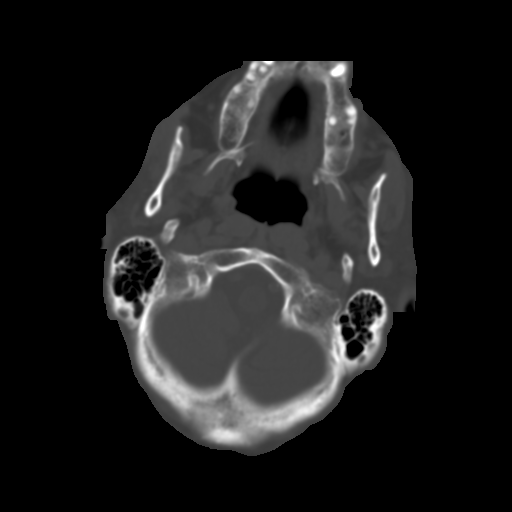
[im 10/37  brain]
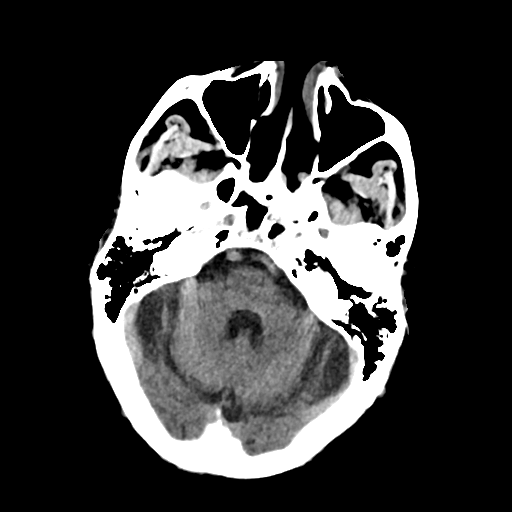
[im 14/37  brain]
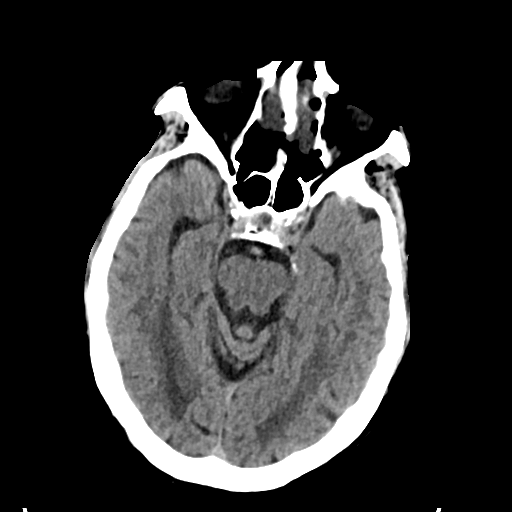
[im 19/37  brain]
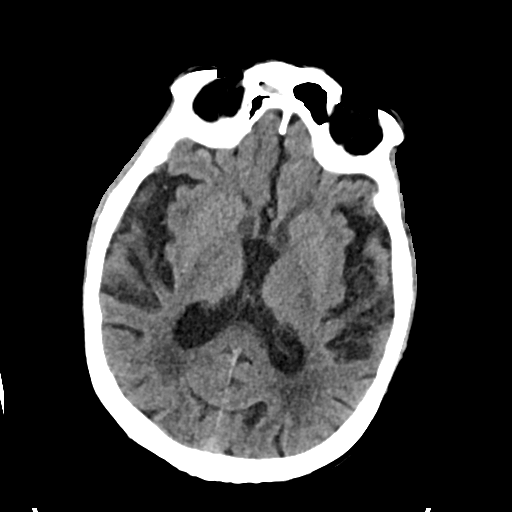
[im 23/37  brain]
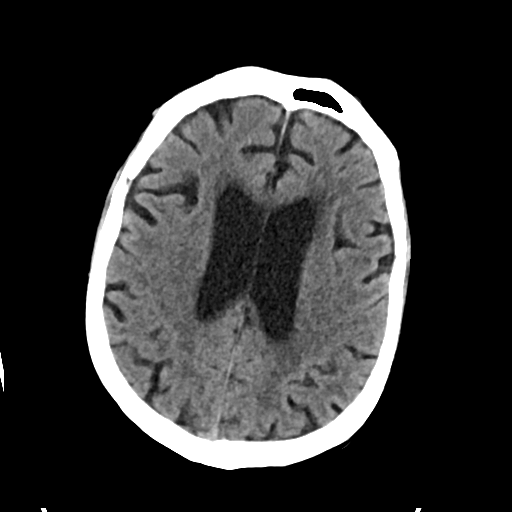
[im 23/37  bone]
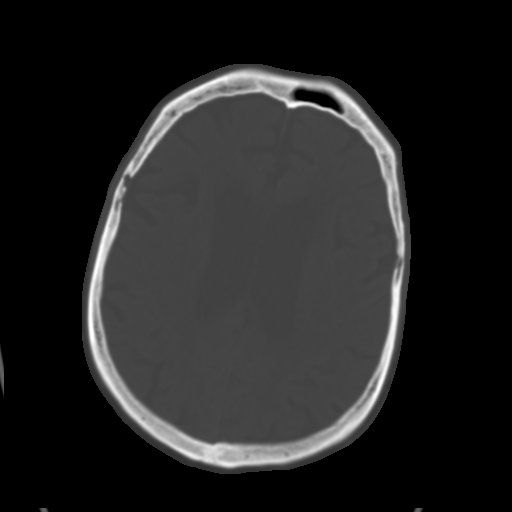
[im 28/37  brain]
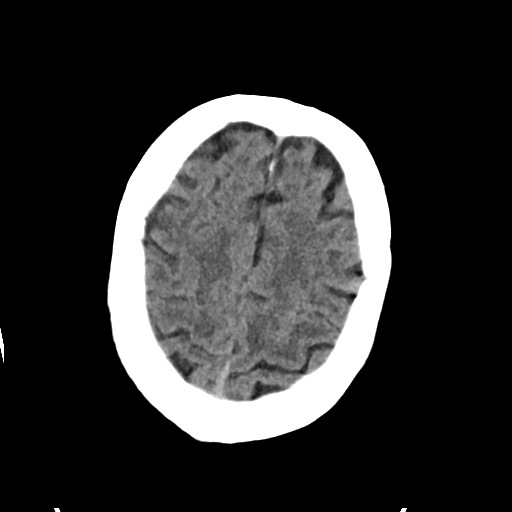
[im 32/37  brain]
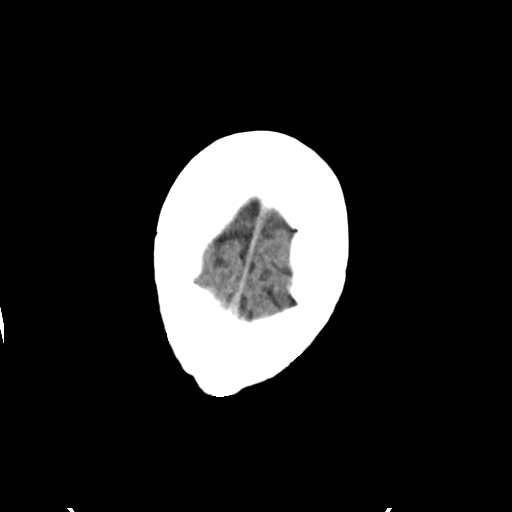

[Series 5: cor soft · coronal · 0.34mm/px · 3 of 73 slices shown]
[im 25/73  brain]
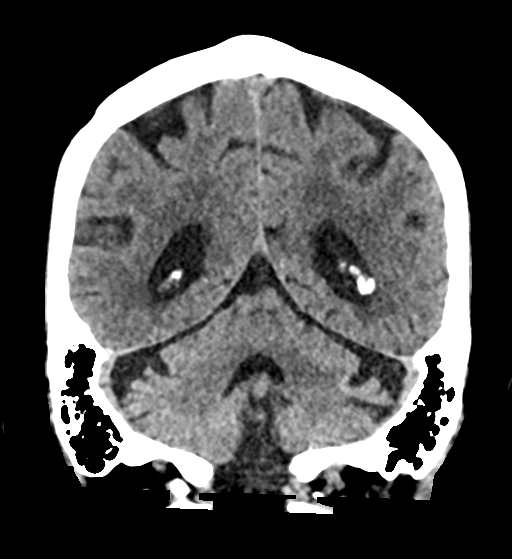
[im 33/73  brain]
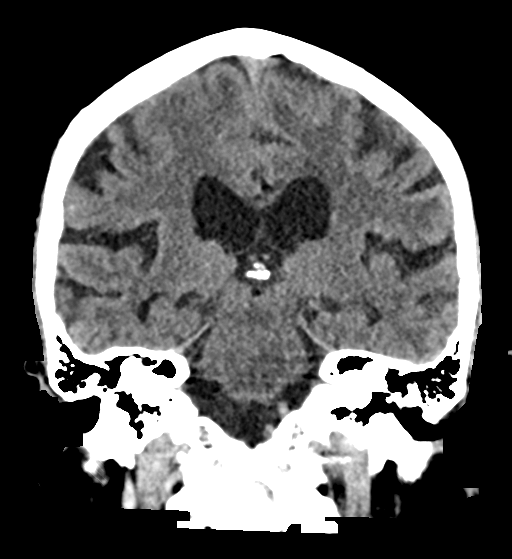
[im 41/73  brain]
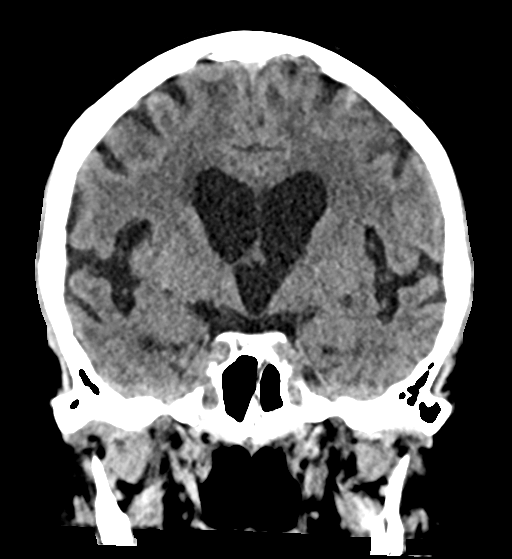

[Series 6: sag soft · sagittal · 0.37mm/px · 3 of 57 slices shown]
[im 19/57  brain]
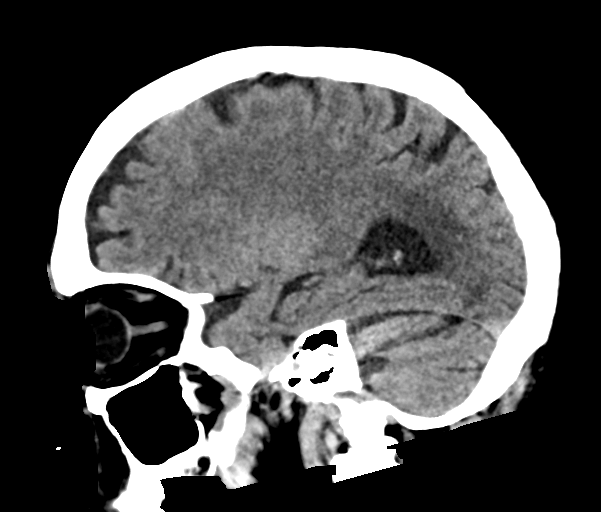
[im 29/57  brain]
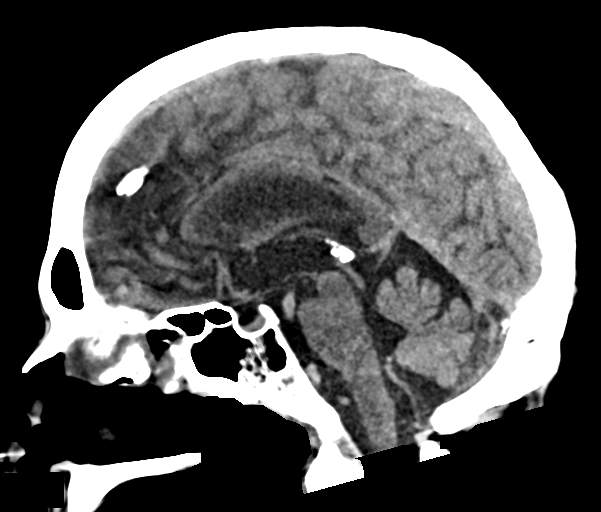
[im 38/57  brain]
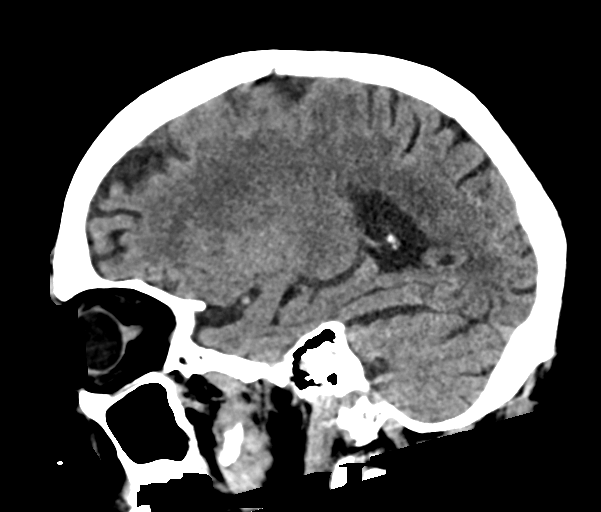

[17 of 47 positions shown; findings below may reference images not displayed]

FINDINGS: Brain: No evidence of acute infarction, hemorrhage, hydrocephalus,
extra-axial collection or mass lesion/mass effect.

Subcortical white matter and periventricular small vessel ischemic
changes.

Vascular: Intracranial atherosclerosis.

Skull: Normal. Negative for fracture or focal lesion.

Sinuses/Orbits: Partial opacification of the right frontal and
bilateral ethmoid sinuses with polypoid soft tissue in the nasal
cavity (series 3/image 34), favoring benign nasal polyposis. Prior
FESS.

Other: None.
IMPRESSION: No evidence of acute intracranial abnormality. Small vessel ischemic
changes.

Polypoid soft tissue in the nasal cavity, favoring benign nasal
polyposis. Consider follow-up consultation with ENT.

## 2021-09-26 IMAGING — DX DG CHEST 1V PORT
1 series · 1 of 1 positions shown · non-contrast
Comparison: [DATE]

CLINICAL DATA: Altered mental status, weakness

EXAM:
PORTABLE CHEST 1 VIEW

[chest ap]
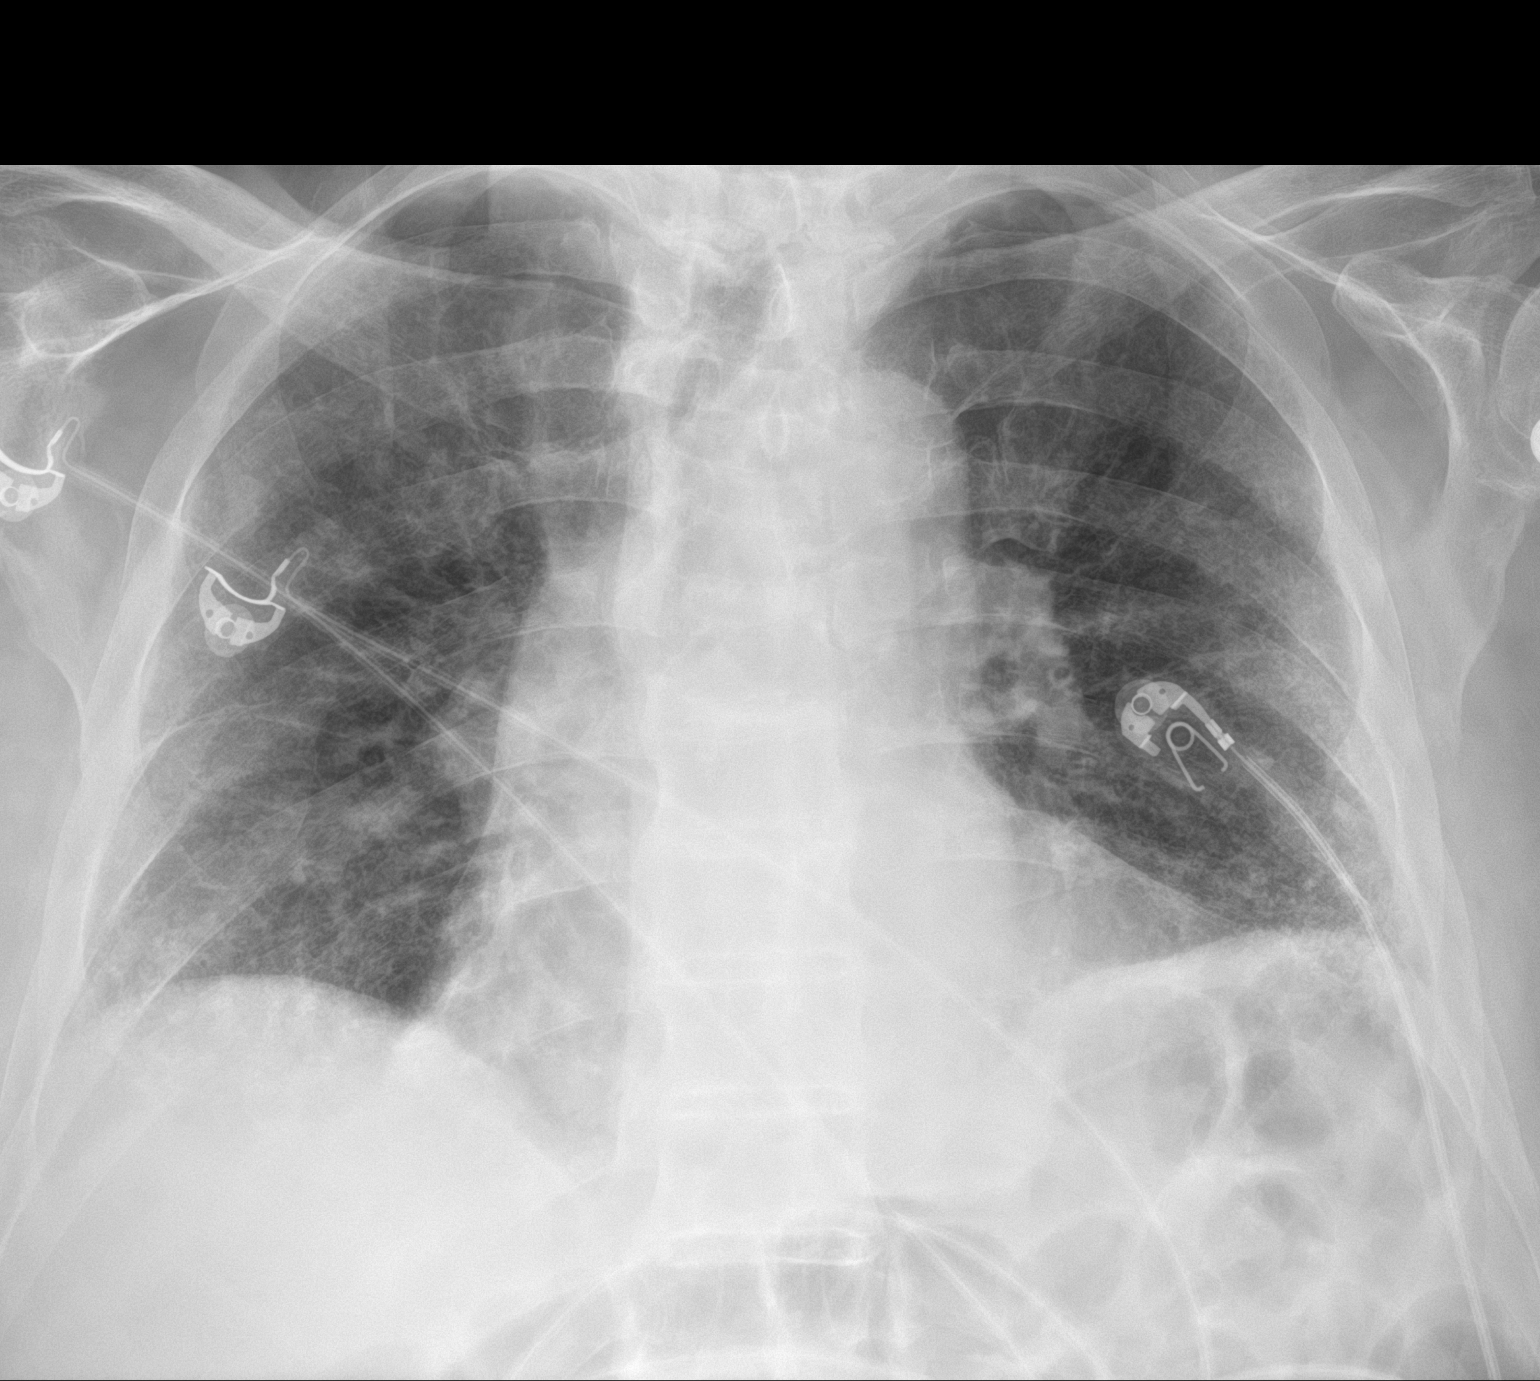

[1 of 1 positions shown; findings below may reference images not displayed]

FINDINGS: Heart size is upper limits of normal. Prominent interstitial
markings most pronounced within the peripheral and bibasilar aspects
of both lungs, similar in appearance and distribution to the
previous CT. Small focal slightly nodular opacity within the right
mid lung measuring approximately 10 mm which could reflect a small
focus of infection or inflammation. Pulmonary nodule not excluded.
No pleural effusion or pneumothorax.
IMPRESSION: 1. Prominent interstitial markings most pronounced within the
peripheral and bibasilar aspects of both lungs, similar in
appearance and distribution to the previous CT.
2. Small focal slightly nodular opacity within the right mid lung
measuring approximately 10 mm which could reflect a small focus of
infection or inflammation. Pulmonary nodule not excluded. Recommend
radiographic follow-up following the resolution of patient's
symptoms to ensure resolution.

## 2021-09-26 IMAGING — MR MR HEAD W/O CM
5 of 6 series · 29 of 48 positions shown · non-contrast
Comparison: CT head [DATE]

CLINICAL DATA: TIA. Right-sided weakness and slurred speech for 3
days.

EXAM:
MRI HEAD WITHOUT CONTRAST
MRA HEAD WITHOUT CONTRAST
TECHNIQUE: Multiplanar, multi-echo pulse sequences of the brain and surrounding
structures were acquired without intravenous contrast. Angiographic
images of the Circle of Willis were acquired using MRA technique
without intravenous contrast.

[Series 5: DWI · axial · 3.0mm · 0.88mm/px · z∈[-59,+93]mm · 10 of 104 slices shown (1 of 4)]
[im 1/104]
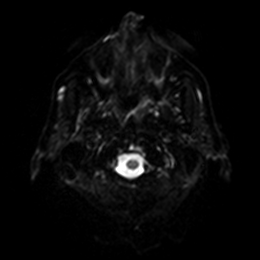
[im 12/104]
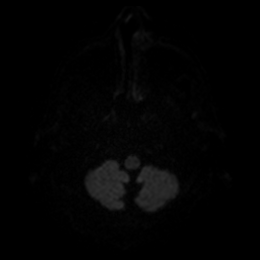
[im 23/104]
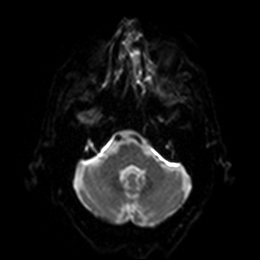
[im 35/104]
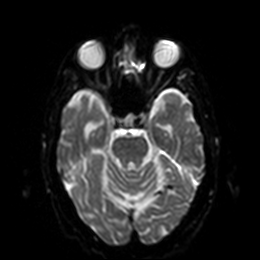
[im 46/104]
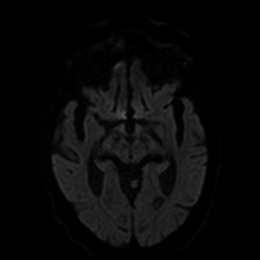
[im 58/104]
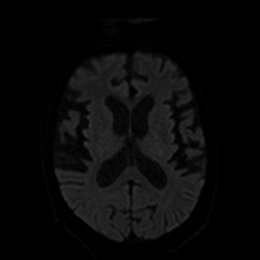
[im 69/104]
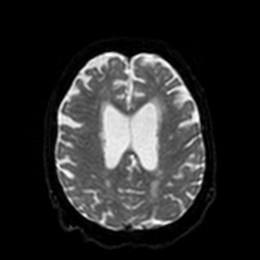
[im 81/104]
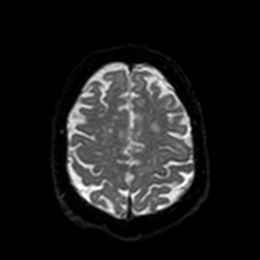
[im 92/104]
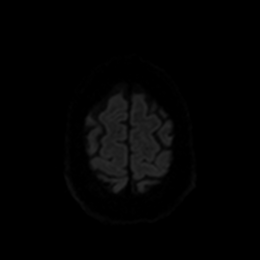
[im 104/104]
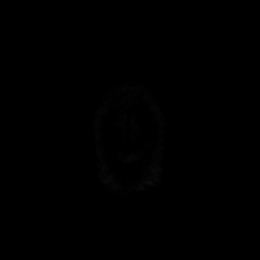

[Series 6: DWI · axial · 3.0mm · 0.88mm/px · z∈[-59,+93]mm · 5 of 52 slices shown (2 of 4)]
[im 1/52]
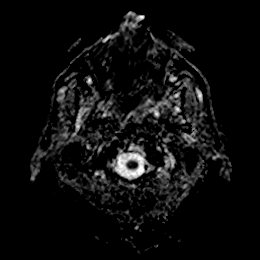
[im 13/52]
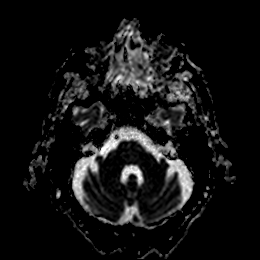
[im 26/52]
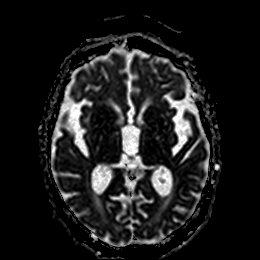
[im 39/52]
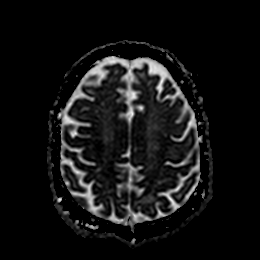
[im 52/52]
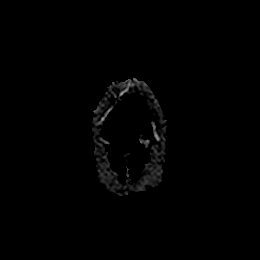

[Series 7: DWI · coronal · 4.0mm · 0.88mm/px · 7 of 72 slices shown (3 of 4)]
[im 1/72]
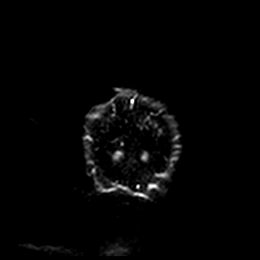
[im 12/72]
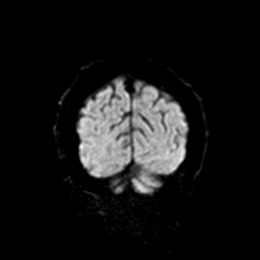
[im 24/72]
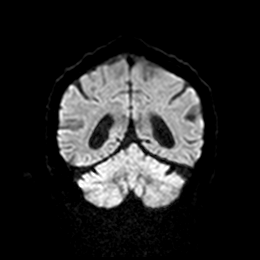
[im 36/72]
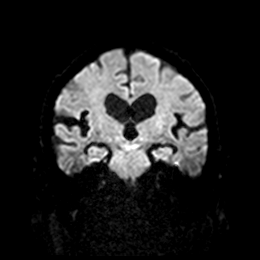
[im 48/72]
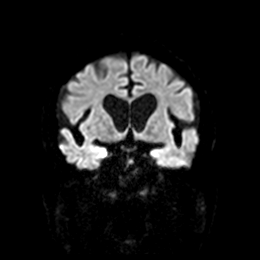
[im 60/72]
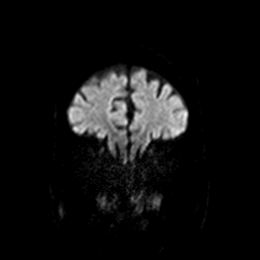
[im 72/72]
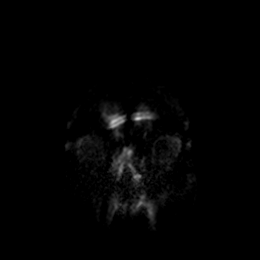

[Series 8: DWI · coronal · 4.0mm · 0.88mm/px · 4 of 36 slices shown (4 of 4)]
[im 1/36]
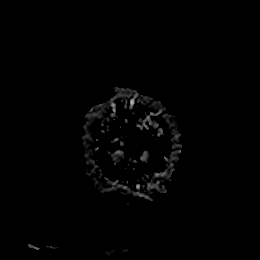
[im 12/36]
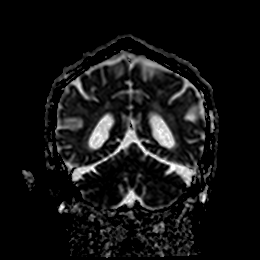
[im 24/36]
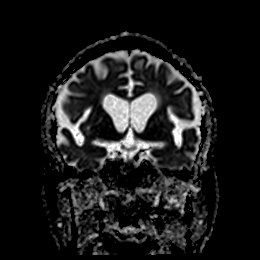
[im 36/36]
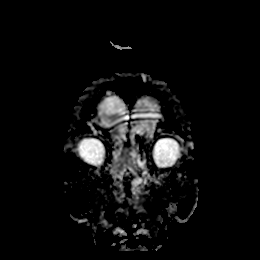

[Series 13: FLAIR · axial · 5.0mm · 0.45mm/px · z∈[-60,+95]mm · 3 of 27 slices shown]
[im 1/27]
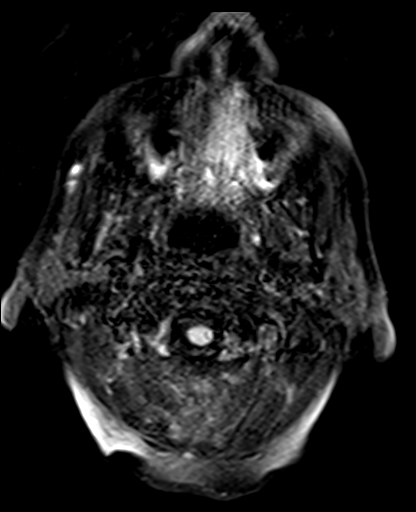
[im 14/27]
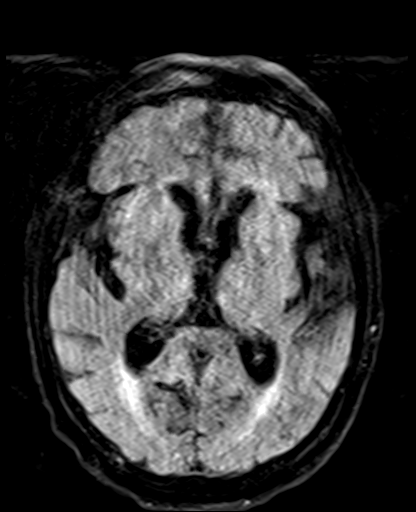
[im 27/27]
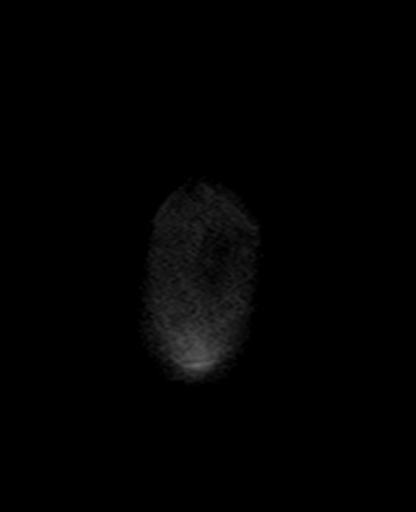

[29 of 48 positions shown; findings below may reference images not displayed]

FINDINGS: MRI HEAD FINDINGS

Brain: Incomplete study. Diagnostic diffusion-weighted imaging was
obtained. Subsequent images are degraded by extensive motion.
Patient not able to complete the study.

Negative for acute infarct.

Generalized atrophy with chronic white matter changes.

No mass lesion identified.

MRA HEAD FINDINGS

Image quality degraded by extensive motion.

Both internal carotid arteries are patent. There is poor signal in
the anterior and middle cerebral arteries which may be artifactual.
Posterior circulation appears widely patent.
IMPRESSION: 1. Negative for acute infarct
2. Motion degraded study. MRI head is incomplete as the patient
could not complete the study
3. MRA degraded by extensive motion.

## 2021-09-26 MED ORDER — HALOPERIDOL LACTATE 5 MG/ML IJ SOLN
2.0000 mg | Freq: Once | INTRAMUSCULAR | Status: AC
  Administered 2021-09-26: 2 mg via INTRAVENOUS
  Filled 2021-09-26: qty 1

## 2021-09-26 MED ORDER — SODIUM CHLORIDE 0.9 % IV BOLUS: 1000.0000 mL | Freq: Once | INTRAVENOUS | Status: DC

## 2021-09-26 MED ORDER — LORAZEPAM 2 MG/ML IJ SOLN
1.0000 mg | Freq: Once | INTRAMUSCULAR | Status: DC | PRN
  Filled 2021-09-26: qty 1

## 2021-09-26 MED ORDER — DIAZEPAM 5 MG/ML IJ SOLN
2.5000 mg | Freq: Once | INTRAMUSCULAR | Status: AC
  Administered 2021-09-26: 2.5 mg via INTRAVENOUS
  Filled 2021-09-26: qty 2

## 2021-09-26 MED ORDER — DIAZEPAM 5 MG/ML IJ SOLN: 5.0000 mg | Freq: Once | INTRAMUSCULAR | Status: DC

## 2021-09-26 NOTE — ED Notes (Signed)
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

## 2021-09-26 NOTE — ED Triage Notes (Signed)
The pt arrived by gems from either a [employer] adviser or neurologist they were not sure whos office they were in.  The pt is from [place]  according  to ems the pt is from [place] unknown if he lives in a facility or not hopefully family is on the way here.  He has a picc line in his lt upper arm   he only  can tell me his name  sits with his eyes closed   ems says rt sided weakness  and slurred speech for 3 days   cbg 101  vitals good  he has some type band on his rt ankle  ??? Electronic  device

## 2021-09-26 NOTE — ED Notes (Signed)
First call made to this facility  told to call back to give report call back ringing only no one answered ptar has already left with the pt

## 2021-09-26 NOTE — Discharge Instructions (Signed)
Your work-up in the ED was unremarkable.  You did not have a stroke or obvious urine infection or pneumonia right now.  Your labs are unremarkable.  Please finish your antibiotics as prescribed by your doctor.  Continue your current meds.  If you are persistently weak, consider physical therapy eval at the facility  See your doctor for follow-up   Return to ER if you have worse slurred speech, weakness, confusion

## 2021-09-26 NOTE — ED Notes (Signed)
Unable to get answers from the pt other than his name.  Family supposed to be coming  the pt is keeping both his eyes closed

## 2021-09-26 NOTE — ED Notes (Signed)
Called Ptar for patient , eta 30 mins out

## 2021-09-26 NOTE — ED Notes (Signed)
Failed his swallow screen

## 2021-09-26 NOTE — ED Notes (Signed)
Wife at the bedside

## 2021-09-26 NOTE — ED Provider Notes (Signed)
[facility] EMERGENCY DEPARTMENT Provider Note   CSN: [id number] Arrival date & time: 09/26/21  1521     History No chief complaint on file.   Maxwell Rollins is a 85 y.o. male hx of dementia, HL, HTN, here with altered mental status.  Patient went to neurology office was noted to be weaker than usual.  Patient currently lives at the nursing home in [place].  Wife goes to visit him every day and was with him yesterday and left around 7 PM.  She is now at bedside and states that his speech is slurred.  Patient also has bilateral leg weakness as well. EMS was called and patient was transferred down to [facility].  Patient was a very difficult historian and unable to give me much history other than telling me that he is weak all over. He has PICC line for IV abx for UTI.   The history is provided by the patient.      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   AAA (abdominal aortic aneurysm) (HCC) 04/06/2021   Weakness of both lower extremities    Essential hypertension    Prediabetes    Thrombocytopenia (HCC)    Acute CVA (cerebrovascular accident) (HCC) 04/03/2021   Dyslipidemia    Hypertension    Dementia (HCC)     Past Surgical History:  Procedure Laterality Date   NASAL POLYP EXCISION     Surgeries X 5 for removal       Family History  Problem Relation Age of Onset   Heart disease Mother     Social History   Tobacco Use   Smoking status: Never   Smokeless tobacco: Never  Substance Use Topics   Alcohol use: Never   Drug use: Never    Home Medications Prior to Admission medications   Medication Sig Start Date End Date  Taking? Authorizing Provider  acetaminophen (TYLENOL) 325 MG tablet Take 1-2 tablets (325-650 mg total) by mouth every 4 (four) hours as needed for mild pain. 04/24/21   Love, Evlyn Kanner, PA-C  amLODipine (NORVASC) 10 MG tablet Take 1 tablet (10 mg total) by mouth daily. 04/06/21 05/06/21  Hughie Closs, MD  aspirin EC 81 MG EC tablet Take 1 tablet (81 mg total) by mouth daily. Swallow whole. 04/07/21   Hughie Closs, MD  atorvastatin (LIPITOR) 40 MG tablet Take 1 tablet (40 mg total) by mouth at bedtime. 04/06/21 05/06/21  Hughie Closs, MD  feeding supplement (ENSURE ENLIVE / ENSURE PLUS) LIQD Take 237 mLs by mouth 2 (two) times daily between meals. 04/24/21   Love, Evlyn Kanner, PA-C  meclizine (ANTIVERT) 25 MG tablet Take 12.5 mg by mouth 3 (three) times daily as needed for dizziness or nausea. 01/31/21   [provider]  memantine (NAMENDA XR) 14 MG CP24 24 hr capsule Take 14 mg by mouth.    [provider]  Multiple Vitamin (MULTIVITAMIN WITH MINERALS) TABS tablet Take 1 tablet by mouth daily. 04/24/21   Love, Evlyn Kanner, PA-C  traZODone (DESYREL) 50 MG tablet Take 0.5 tablets (25 mg total) by mouth at bedtime as needed for sleep.  04/24/21   Love, Evlyn Kanner, PA-C  vitamin B-12 (CYANOCOBALAMIN) 1000 MCG tablet Take 1 tablet (1,000 mcg total) by mouth daily. 04/24/21   Love, Evlyn Kanner, PA-C  vitamin C (ASCORBIC ACID) 500 MG tablet Take 500 mg by mouth daily. 03/04/18   [provider]    Allergies    Contrast media [iodinated diagnostic agents] and Red dye  Review of Systems   Review of Systems  Neurological:  Positive for weakness.  All other systems reviewed and are negative.  Physical Exam Updated Vital Signs BP 115/84   Pulse 83   Temp (!) 97.5 F (36.4 C) (Oral)   Resp 19   Ht 6\' 2"  (1.88 m)   Wt 90.6 kg   SpO2 96%   BMI 25.64 kg/m   Physical Exam Vitals and nursing note reviewed.  Constitutional:      Appearance: Normal appearance.  HENT:     Head: Normocephalic.      Nose: Nose normal.     Mouth/Throat:     Mouth: Mucous membranes are moist.  Eyes:     Extraocular Movements: Extraocular movements intact.     Pupils: Pupils are equal, round, and reactive to light.  Cardiovascular:     Rate and Rhythm: Normal rate and regular rhythm.     Pulses: Normal pulses.     Heart sounds: Normal heart sounds.  Pulmonary:     Effort: Pulmonary effort is normal.     Breath sounds: Normal breath sounds.  Abdominal:     General: Abdomen is flat.     Palpations: Abdomen is soft.  Musculoskeletal:        General: Normal range of motion.     Cervical back: Normal range of motion and neck supple.  Skin:    General: Skin is warm.     Capillary Refill: Capillary refill takes less than 2 seconds.  Neurological:     General: No focal deficit present.     Mental Status: He is alert.     Comments: Strength 4/5 L side. ? Slurred speech   Psychiatric:        Mood and Affect: Mood normal.        Behavior: Behavior normal.    ED Results / Procedures / Treatments   Labs (all labs ordered are listed, but only abnormal results are displayed) Labs Reviewed  APTT - Abnormal; Notable for the following components:      Result Value   aPTT 37 (*)    All other components within normal limits  CBC - Abnormal; Notable for the following components:   WBC 11.1 (*)    All other components within normal limits  DIFFERENTIAL - Abnormal; Notable for the following components:   Neutro Abs 8.2 (*)    All other components within normal limits  COMPREHENSIVE METABOLIC PANEL - Abnormal; Notable for the following components:   Creatinine, Ser 1.30 (*)    Total Protein 6.1 (*)    Albumin 3.0 (*)    Alkaline Phosphatase 132 (*)    Total Bilirubin 1.3 (*)    GFR, Estimated 55 (*)    All other components within normal limits  URINALYSIS, ROUTINE W REFLEX MICROSCOPIC - Abnormal; Notable for the following components:   Ketones, ur 20 (*)    All other components within normal limits   RESP PANEL BY RT-PCR (FLU A&B, COVID) ARPGX2  URINE CULTURE  ETHANOL  PROTIME-INR  RAPID URINE DRUG SCREEN, HOSP PERFORMED  I-STAT CHEM 8, ED  EKG EKG Interpretation  Date/Time:  Wednesday September 26 2021 15:23:35 EDT Ventricular Rate:  78 PR Interval:  158 QRS Duration: 106 QT Interval:  405 QTC Calculation: 462 R Axis:   -26 Text Interpretation: Sinus rhythm Borderline left axis deviation Abnormal R-wave progression, early transition Borderline T abnormalities, anterior leads No significant change since last tracing Confirmed by Richardean Canal [phone]) on 09/26/2021 3:47:15 PM  Radiology CT HEAD WO CONTRAST ( )  Result Date: 09/26/2021 CLINICAL DATA:  Altered mental status EXAM: CT HEAD WITHOUT CONTRAST TECHNIQUE: Contiguous axial images were obtained from the base of the skull through the vertex without intravenous contrast. COMPARISON:  MRI brain dated 09/26/2021.  CT head dated 04/02/2021. FINDINGS: Brain: No evidence of acute infarction, hemorrhage, hydrocephalus, extra-axial collection or mass lesion/mass effect. Subcortical white matter and periventricular small vessel ischemic changes. Vascular: Intracranial atherosclerosis. Skull: Normal. Negative for fracture or focal lesion. Sinuses/Orbits: Partial opacification of the right frontal and bilateral ethmoid sinuses with polypoid soft tissue in the nasal cavity (series 3/image 34), favoring benign nasal polyposis. Prior FESS. Other: None. IMPRESSION: No evidence of acute intracranial abnormality. Small vessel ischemic changes. Polypoid soft tissue in the nasal cavity, favoring benign nasal polyposis. Consider follow-up consultation with ENT. Electronically Signed   By: Charline Bills M.D.   On: 09/26/2021 19:10   MR ANGIO HEAD WO CONTRAST  Result Date: 09/26/2021 CLINICAL DATA:  TIA. Right-sided weakness and slurred speech for 3 days. EXAM: MRI HEAD WITHOUT CONTRAST MRA HEAD WITHOUT CONTRAST TECHNIQUE: Multiplanar,  multi-echo pulse sequences of the brain and surrounding structures were acquired without intravenous contrast. Angiographic images of the Circle of Willis were acquired using MRA technique without intravenous contrast. COMPARISON:  CT head 04/02/2021 FINDINGS: MRI HEAD FINDINGS Brain: Incomplete study. Diagnostic diffusion-weighted imaging was obtained. Subsequent images are degraded by extensive motion. Patient not able to complete the study. Negative for acute infarct. Generalized atrophy with chronic white matter changes. No mass lesion identified. MRA HEAD FINDINGS Image quality degraded by extensive motion. Both internal carotid arteries are patent. There is poor signal in the anterior and middle cerebral arteries which may be artifactual. Posterior circulation appears widely patent. IMPRESSION: 1. Negative for acute infarct 2. Motion degraded study. MRI head is incomplete as the patient could not complete the study 3. MRA degraded by extensive motion. Electronically Signed   By: Marlan Palau M.D.   On: 09/26/2021 18:36   MR BRAIN WO CONTRAST  Result Date: 09/26/2021 CLINICAL DATA:  TIA. Right-sided weakness and slurred speech for 3 days. EXAM: MRI HEAD WITHOUT CONTRAST MRA HEAD WITHOUT CONTRAST TECHNIQUE: Multiplanar, multi-echo pulse sequences of the brain and surrounding structures were acquired without intravenous contrast. Angiographic images of the Circle of Willis were acquired using MRA technique without intravenous contrast. COMPARISON:  CT head 04/02/2021 FINDINGS: MRI HEAD FINDINGS Brain: Incomplete study. Diagnostic diffusion-weighted imaging was obtained. Subsequent images are degraded by extensive motion. Patient not able to complete the study. Negative for acute infarct. Generalized atrophy with chronic white matter changes. No mass lesion identified. MRA HEAD FINDINGS Image quality degraded by extensive motion. Both internal carotid arteries are patent. There is poor signal in the anterior  and middle cerebral arteries which may be artifactual. Posterior circulation appears widely patent. IMPRESSION: 1. Negative for acute infarct 2. Motion degraded study. MRI head is incomplete as the patient could not complete the study 3. MRA degraded by extensive motion. Electronically Signed   By: Marlan Palau M.D.   On: 09/26/2021 18:36  DG Chest Port 1 View  Result Date: 09/26/2021 CLINICAL DATA:  Altered mental status, weakness EXAM: PORTABLE CHEST 1 VIEW COMPARISON:  04/03/2021 FINDINGS: Heart size is upper limits of normal. Prominent interstitial markings most pronounced within the peripheral and bibasilar aspects of both lungs, similar in appearance and distribution to the previous CT. Small focal slightly nodular opacity within the right mid lung measuring approximately 10 mm which could reflect a small focus of infection or inflammation. Pulmonary nodule not excluded. No pleural effusion or pneumothorax. IMPRESSION: 1. Prominent interstitial markings most pronounced within the peripheral and bibasilar aspects of both lungs, similar in appearance and distribution to the previous CT. 2. Small focal slightly nodular opacity within the right mid lung measuring approximately 10 mm which could reflect a small focus of infection or inflammation. Pulmonary nodule not excluded. Recommend radiographic follow-up following the resolution of patient's symptoms to ensure resolution. Electronically Signed   By: Duanne Guess D.O.   On: 09/26/2021 16:41    Procedures Procedures   Medications Ordered in ED Medications  LORazepam (ATIVAN) injection 1 mg (has no administration in time range)  sodium chloride 0.9 % bolus 1,000 mL (has no administration in time range)  haloperidol lactate (HALDOL) injection 2 mg (2 mg Intravenous Given 09/26/21 1905)  diazepam (VALIUM) injection 2.5 mg (2.5 mg Intravenous Given 09/26/21 1907)    ED Course  I have reviewed the triage vital signs and the nursing  notes.  Pertinent labs & imaging results that were available during my care of the patient were reviewed by me and considered in my medical decision making (see chart for details).    MDM Rules/Calculators/A&P                           Macgregor Aeschliman is a 85 y.o. male here presenting with slurred speech, weakness.  Last normal was sometime yesterday.  Patient has some slurred speech and left-sided weakness.  Patient also has previous history of stroke.  Patient is on IV antibiotics for UTI right now.  We will check urinalysis and labs and get CT head and MRI brain.  8:35 PM Labs and UA and chest x-ray unremarkable.  Patient did not tolerate MRI that well but no obvious stroke on MRI.  Patient required sedation for MRI so is very sedated now.  Wife is at bedside.  Since patient does not have a medical reason for admission, she is agreeable for patient to return back to the nursing facility. The nursing facility also has rehab so he can get some rehab if he is persistently weak.     Final Clinical Impression(s) / ED Diagnoses Final diagnoses:  None    Rx / DC Orders ED Discharge Orders     None        Charlynne Pander, MD 09/26/21 2036

## 2021-09-26 NOTE — ED Notes (Signed)
The pts wife at bedside does not appear to know  a lot of the information needed  such as his antibiotics  he was apprarentally  hospita;ized recently ?? Where.  He lives at [facility] for 3 weeks according to his wife

## 2021-09-26 NOTE — ED Notes (Signed)
Report given to the person answering the phone     no name given to me

## 2021-09-26 NOTE — ED Notes (Signed)
The pt has had a recent gb removal  he has a wound vac

## 2021-09-26 NOTE — ED Notes (Signed)
Waiting for family  difficulty  for the family to follow directions

## 2021-09-26 NOTE — ED Notes (Signed)
Pt leaving with ptar  enroute to [facility]  call made to the nursing home  person unable to  take report

## 2021-09-28 LAB — URINE CULTURE: Culture: NO GROWTH

## 2021-10-01 ENCOUNTER — Ambulatory Visit: Payer: Medicare Other | Admitting: Adult Health

## 2021-10-01 ENCOUNTER — Encounter: Payer: Self-pay | Admitting: Adult Health

## 2021-10-01 NOTE — Progress Notes (Deleted)
[name] [address] [place]. [place] [address] [phone]       HOSPITAL FOLLOW UP NOTE  [name] Date of Birth:  [birth year] Medical Record Number:  [id number]   Reason for Referral:  hospital stroke follow up    SUBJECTIVE:   CHIEF COMPLAINT:  No chief complaint on file.   HPI:   Maxwell Rollins is a 85 y.o. 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    Update 10/01/2021 JM: Returns for general follow-up after prior visit 4 months ago.  He was evaluated at [facility] ED on 09/26/2021 due to slurred speech, bilateral leg weakness and worsening cognition.  He was sent there from neurosurgery OV with reporting significant acute neurological decline over the prior few days with difficulty ambulating, confusion, drooling out of the right side of his mouth and garbled speech.  Prior to onset, he was ambulating with a cane but now requiring wheelchair.  Multiple falls within that few day period and continued worsening of symptoms day by day.  He was appropriately sent to the ED for concern of stroke. CT head and MR brain negative for acute findings.  Labs, UA and chest x-ray unremarkable.  He is actively being treated for UTI via PICC but on recent UA, no evidence of residual infection.       History provided for reference purposes only Initial visit 05/30/2021 JM: Maxwell Rollins is being seen for hospital follow-up accompanied by his daughter, Prince Rome and friend Annice Pih via telephone.  He continues to reside at [facility] in [place], [place] where he continues to receive PT/OT/SLP for residual lower extremity weakness,  gait impairment and cognitive impairment.  Daughter does report improvement since discharge.  He is ambulating short distances with a rolling walker and denies any recent falls.  Denies new stroke/TIA symptoms.  Completed 3 weeks DAPT and remains on aspirin alone as well as atorvastatin 40 mg daily without associated side effects.  Blood pressure today 109/73.  Other concerns:  Back pain: Chronic issue.  Has never been seen by neurosurgery or orthopedics.  He has been seen by a chiropractor for "years" per daughter.  He does endorse symptoms of radiculopathy in right leg.  He will experience increased right leg pain with increased ambulation and will resolve shortly after resting.  Denies bilateral lower extremity numbness/tingling or painful paresthesias.  Vertigo/dizziness: per daughter, this has been present since he had a fall 2 years ago in [place].  He was evaluated at local hospital without any intracranial injury but did require sutures in the back of his head.  This has been relatively stable until he was diagnosed with COVID 12/2019 with reported worsening.  He apparently was advised to follow-up with neurology after COVID admission for further evaluation but this was never pursued.  Daughter is unsure of exact reasoning for neurology evaluation - she believes possibly due to gait impairment with vertigo. Vertigo can be present while ambulating, position changes or quick head movements  Cognitive impairment: Present since COVID 12/2019 with occasional word finding difficulty with worsening after recent admission.  He can have confusion in the evening and even this morning tried to leave the facility alone to go to todays appointment.  He was recently started on Namenda  XR 14 mg daily by SNF.  No known family history of dementia  AAA: Evaluated at [facility] vascular surgery on 05/10/2021 with OV personally reviewed. Per note, discussed treatment options including medical management versus surgical  intervention and patient was not interested in repair given associated risks especially given risk of worsening dementia and recent stroke.  No follow-up recommended    ROS:   14 system review of systems performed and negative with exception of those listed in HPI  PMH:  Past Medical History:  Diagnosis Date   COVID-19 2019   Was hospitalized/ has had dizzines on and off since   Dementia (HCC)    Dyslipidemia    Hypertension     PSH:  Past Surgical History:  Procedure Laterality Date   NASAL POLYP EXCISION     Surgeries X 5 for removal    Social History:  Social History   Socioeconomic History   Marital status: Married    Spouse name: Not on file   Number of children: Not on file   Years of education: Not on file   Highest education level: Not on file  Occupational History   Occupation: retired  Tobacco Use   Smoking status: Never   Smokeless tobacco: Never  Substance and Sexual Activity   Alcohol use: Never   Drug use: Never   Sexual activity: Not on file  Other Topics Concern   Not on file  Social History Narrative   Not on file   Social Determinants of Health   Financial Resource Strain: Not on file  Food Insecurity: Not on file  Transportation Needs: Not on file  Physical Activity: Not on file  Stress: Not on file  Social Connections: Not on file  Intimate Partner Violence: Not on file    Family History:  Family History  Problem Relation Age of Onset   Heart disease Mother     Medications:   Current Outpatient Medications on File Prior to Visit  Medication Sig Dispense Refill   acetaminophen (TYLENOL) 325 MG tablet Take 1-2 tablets (325-650 mg total) by mouth every 4 (four) hours as needed for mild pain.     amLODipine (NORVASC) 10 MG tablet Take 1 tablet (10 mg total) by mouth daily. 30 tablet 0   aspirin EC 81 MG EC tablet Take 1 tablet (81 mg total) by mouth daily. Swallow whole. 30 tablet 0   atorvastatin (LIPITOR) 40 MG tablet Take 1  tablet (40 mg total) by mouth at bedtime. 30 tablet 0   feeding supplement (ENSURE ENLIVE / ENSURE PLUS) LIQD Take 237 mLs by mouth 2 (two) times daily between meals. 237 mL 12   meclizine (ANTIVERT) 25 MG tablet Take 12.5 mg by mouth 3 (three) times daily as needed for dizziness or nausea.     memantine (NAMENDA XR) 14 MG CP24 24 hr capsule Take 14 mg by mouth.     Multiple Vitamin (MULTIVITAMIN WITH MINERALS) TABS tablet Take 1 tablet by mouth daily.     traZODone (DESYREL) 50 MG tablet Take 0.5 tablets (25 mg total) by mouth at bedtime as needed for sleep.     vitamin B-12 (CYANOCOBALAMIN) 1000 MCG tablet Take 1 tablet (1,000 mcg total) by mouth daily.     vitamin C (ASCORBIC ACID) 500 MG tablet Take 500 mg by mouth daily.     No current facility-administered medications on file prior to visit.    Allergies:   Allergies  Allergen Reactions   Contrast Media [Iodinated  Diagnostic Agents]     Other reaction(s): rash/itching   Red Dye       OBJECTIVE:  Physical Exam  There were no vitals filed for this visit.  There is no height or weight on file to calculate BMI. No results found.  General: well developed, well nourished,  very pleasant elderly Caucasian male, seated, in no evident distress Head: head normocephalic and atraumatic.   Neck: supple with no carotid or supraclavicular bruits Cardiovascular: regular rate and rhythm, no murmurs Musculoskeletal: no deformity Skin:  no rash/petichiae Vascular:  Normal pulses all extremities   Neurologic Exam Mental Status: Awake and fully alert.   Fluent speech and language.  Disoriented to place and time. Recent memory impaired and remote memory intact. Attention span, concentration and fund of knowledge impaired with daughter providing majority of history. Mood and affect appropriate.  Cranial Nerves: Fundoscopic exam reveals sharp disc margins. Pupils equal, briskly reactive to light. Extraocular movements full without nystagmus.  Visual fields full to confrontation. Hearing intact. Facial sensation intact. Face, tongue, palate moves normally and symmetrically.  Motor: Normal bulk and tone. Normal strength in all tested extremity muscles except right foot drop noted Sensory.:  Intact upper extremity sensation bilaterally. R>L LE decreased pinprick, light touch and vibratory sensation distally Coordination: Rapid alternating movements normal in all extremities. Finger-to-nose and heel-to-shin performed accurately left leg with difficulty performing with right leg.  No evidence of action or resting tremors, cogwheel rigidity or bradykinesia. Gait and Station: Deferred as RW not present Reflexes: 1+ and symmetric. Toes downgoing.     NIHSS  2 (LOC question and sensation impairment although likely due to lower back issues) Modified Rankin  3-4      ASSESSMENT: Maxwell Rollins is a 85 y.o. year old male with recent incidental finding of left small hippocampal infarct likely secondary to small vessel disease on 04/02/2021 after initial presentation of lightheadedness and whole body weakness.  Also found to have large AAA and evaluated at [facility]  Vascular risk factors include HTN, large AAA, advanced age, COVID-19 12/2019 with residual persistent dizziness and CKD.      PLAN:  L hippocampal infarct, incidental finding:  Continue participating with SNF PT/OT/SLP.   Continue aspirin 81 mg daily  and atorvastatin 40 mg daily for secondary stroke prevention.   Discussed secondary stroke prevention measures and importance of close PCP follow up for aggressive stroke risk factor management  HTN: BP goal <130/90.  Stable on current regimen per PCP HLD: LDL goal <70. Recent LDL 110 -currently on atorvastatin 40 mg daily.  AAA: dx'd 03/2021.  Evaluated at [facility] -patient declined surgical intervention due to increased risk was advised to follow-up on an as-needed basis.  Dementia: Likely multifactorial with history  of COVID and recent stroke.  Recently started on Namenda by facility which was recommended to continue.  Continue participation with SLP and cognitive therapy.  Obtain dementia panel today. Will plan to formally assess with [facility]SE at follow-up visit Vertigo: per daughter, present since a fall 2 years ago. ?  Postconcussive syndrome vs BPPV vs etc.  May consider vestibular rehab once current PT completed and further strengthening of BLE.  Lumbar stenosis: Chronic issue.  Possibly contributing to gait impairment and BLE weakness. Noted right foot drop on exam with reported radiculopathy symptoms with sensory changes and possible neurogenic claudication.  Referral placed to neurosurgery for further evaluation    Follow up in 3 months or call earlier if needed   CC:  [facility] provider: Dr. Pearlean Brownie PCP: Elise Benne, MD    I spent 64 minutes of face-to-face and non-face-to-face time with patient and daughter and friend via telephone.  This included previsit chart review including recent hospitalization pertinent progress notes, lab work and imaging, order entry, electronic health record documentation, and extensive daughter and patient education and discussion regarding recent stroke including etiologies, secondary stroke prevention measures and importance of aggressive stroke risk factor management as well as risk of recurrent stroke, and multiple other concerns regarding dementia, vertigo, lumbar stenosis and AAA (see above) and answered all other questions to patient and daughter satisfaction  Ihor Austin, AGNP-BC  [facility] [address] [place], [place] [address]  Phone [phone] Fax [phone] Note: This document was prepared with digital dictation and possible smart phrase technology. Any transcriptional errors that result from this process are unintentional.

## 2021-10-26 ENCOUNTER — Encounter (HOSPITAL_COMMUNITY): Payer: Self-pay | Admitting: Radiology

## 2021-10-31 ENCOUNTER — Ambulatory Visit: Payer: Medicare Other | Admitting: Neurology

## 2022-01-21 ENCOUNTER — Encounter: Payer: Self-pay | Admitting: Neurology

## 2022-01-21 ENCOUNTER — Other Ambulatory Visit: Payer: Self-pay

## 2022-01-21 ENCOUNTER — Other Ambulatory Visit: Payer: Self-pay | Admitting: *Deleted

## 2022-01-21 ENCOUNTER — Ambulatory Visit (INDEPENDENT_AMBULATORY_CARE_PROVIDER_SITE_OTHER): Payer: Medicare Other | Admitting: Neurology

## 2022-01-21 VITALS — BP 101/68 | HR 70 | Ht 74.0 in | Wt 181.8 lb

## 2022-01-21 DIAGNOSIS — G301 Alzheimer's disease with late onset: Secondary | ICD-10-CM | POA: Diagnosis not present

## 2022-01-21 DIAGNOSIS — M51369 Other intervertebral disc degeneration, lumbar region without mention of lumbar back pain or lower extremity pain: Secondary | ICD-10-CM | POA: Insufficient documentation

## 2022-01-21 DIAGNOSIS — R2681 Unsteadiness on feet: Secondary | ICD-10-CM | POA: Insufficient documentation

## 2022-01-21 DIAGNOSIS — R5383 Other fatigue: Secondary | ICD-10-CM | POA: Insufficient documentation

## 2022-01-21 DIAGNOSIS — M5136 Other intervertebral disc degeneration, lumbar region: Secondary | ICD-10-CM | POA: Insufficient documentation

## 2022-01-21 DIAGNOSIS — R42 Dizziness and giddiness: Secondary | ICD-10-CM | POA: Insufficient documentation

## 2022-01-21 DIAGNOSIS — Z8673 Personal history of transient ischemic attack (TIA), and cerebral infarction without residual deficits: Secondary | ICD-10-CM

## 2022-01-21 DIAGNOSIS — E785 Hyperlipidemia, unspecified: Secondary | ICD-10-CM | POA: Insufficient documentation

## 2022-01-21 DIAGNOSIS — N179 Acute kidney failure, unspecified: Secondary | ICD-10-CM | POA: Insufficient documentation

## 2022-01-21 DIAGNOSIS — F02A18 Dementia in other diseases classified elsewhere, mild, with other behavioral disturbance: Secondary | ICD-10-CM

## 2022-01-21 DIAGNOSIS — E7800 Pure hypercholesterolemia, unspecified: Secondary | ICD-10-CM | POA: Insufficient documentation

## 2022-01-21 MED ORDER — MEMANTINE HCL ER 21 MG PO CP24: 21.0000 mg | ORAL_CAPSULE | Freq: Every day | ORAL | 1 refills | Status: DC

## 2022-01-21 MED ORDER — AMLODIPINE BESYLATE 5 MG PO TABS: 5.0000 mg | ORAL_TABLET | Freq: Every day | ORAL | 3 refills | Status: DC

## 2022-01-21 MED ORDER — MEMANTINE HCL ER 7 MG PO CP24: 21.0000 mg | ORAL_CAPSULE | Freq: Every day | ORAL | Status: DC

## 2022-01-21 NOTE — Progress Notes (Signed)
[name] [address] [place]. [place] [address] [phone]       HOSPITAL FOLLOW UP NOTE  Mr. Algird Rietz Date of Birth:  [birth year] Medical Record Number:  [id number]   Reason for Referral:  hospital stroke follow up    SUBJECTIVE:   CHIEF COMPLAINT:  Chief Complaint  Patient presents with   New Patient (Initial Visit)    Rm 17. Accompanied by daughter. NX Jessica/internal referral for altered mental status also received paper referral for acute confusion    HPI:   Arkell Feige is a 86 y.o. male with a past medical history per [facility] of hyperlipidemia, hypertension (patient denies this and reports he is not on any antihypertensive medications), former smoking, numbness in the feet, vertigo, stage III Chronic kidney disease, dementia (which patient denies) who presented on 04/02/2021 with lightheadedness and whole body weakness. CT no acute abnormality.  MRI showed left punctate hippocampal infarct.  MRA head, Doppler, 2D echo, LE venous Doppler all unremarkable.  LDL 110, A1c 5.8, UDS negative, creatinine 1.5.  CT abdomen pelvis showed large AAA 6.2 cm, vascular surgery consulted who recommended referral to [facility] for further evaluation.  Stroke felt to be incidental as it does not correlate with presenting symptoms.  Lower extremity weakness felt to be due to lumbar stenosis with MRI lumbar spine multifactoral degenerative changes L4-L5 with mild spinal stenosis and moderate right > left  L4 foraminal stenosis as well as L5 on S1 anterolisthesis.  Symptoms concerning for presyncope recommended further evaluation by medical team.  Recommended DAPT for 3 weeks and aspirin alone.  LDL 110 and increase home dose atorvastatin 20 mg to 40 mg daily.  Other stroke risk factors include advanced age and former tobacco use but no prior stroke history.  Other active problems include CKD and history of COVID 12/2019 with residual  persistent dizziness.  Evaluated by therapies who recommended discharge to CIR for impairments due to weakness, difficulty with mobility as well as cognitive deficits affecting functional status.  He is eventually discharged to SNF due to continued limitation in setting of cognitive deficits, gait impairment, impaired postural control and poor safety awareness.   Today, 05/30/2021, [name] is being seen for hospital follow-up accompanied by his daughter, [name] and friend [name] via telephone.  He continues to reside at [facility] and rehabilitation center in [place], [place] where he continues to receive PT/OT/SLP for residual lower extremity weakness, gait impairment and cognitive impairment.  Daughter does report improvement since discharge.  He is ambulating short distances with a rolling walker and denies any recent falls.  Denies new stroke/TIA symptoms.  Completed 3 weeks DAPT and remains on aspirin alone as well as atorvastatin 40 mg daily without associated side effects.  Blood pressure today 109/73.  Other concerns:  Back pain: Chronic issue.  Has never been seen by neurosurgery or orthopedics.  He has been seen by a chiropractor for "years" per daughter.  He does endorse symptoms of radiculopathy in right leg.  He will experience increased right leg pain with increased ambulation and will resolve shortly after resting.  Denies bilateral lower extremity numbness/tingling or painful paresthesias.  Vertigo/dizziness: per daughter, this has been present since he had a fall 2 years ago in [place].  He was evaluated at local hospital without any intracranial injury but did require sutures in the back of his head.  This has been relatively stable until he was diagnosed with COVID 12/2019 with reported worsening.  He apparently was advised to follow-up with neurology after [place] admission for further evaluation but this was never pursued.  Daughter is unsure of exact reasoning for neurology  evaluation - she believes possibly due to gait impairment with vertigo. Vertigo can be present while ambulating, position changes or quick head movements  Cognitive impairment: Present since COVID 12/2019 with occasional word finding difficulty with worsening after recent admission.  He can have confusion in the evening and even this morning tried to leave the facility alone to go to todays appointment.  He was recently started on Namenda XR 14 mg daily by SNF.  No known family history of dementia  AAA: Evaluated at [facility] vascular surgery on 05/10/2021 with OV personally reviewed. Per note, discussed treatment options including medical management versus surgical intervention and patient was not interested in repair given associated risks especially given risk of worsening dementia and recent stroke.  No follow-up recommended  Update 01/21/2022 : Patient is seen for follow-up today after last visit with [name] nurse practitioner on 05/30/2021.  He is accompanied night by his daughter provides most of the history.  Patient has not done well since the last visit with chronic dizziness and falls.  He has had at least 5 visits to emergency room and eventually was to [facility] skilled nursing facility and is now barely been home for 2 weeks or so.  Is currently getting home physical and Occupational Therapy.  He is able to ambulate with a walker.  He has had no recent falls.  Patient has had cognitive decline since her last visit.  He has not only poor short-term memory but also has trouble at times finding words and completing sentences.  He also sundown's.  At times he becomes combative.  He also reminisces a lot about the past.  He is also had occasional hallucinations and on one occasion he called the daughter to come and get him from the airport and he was at home.  He is currently on Namenda Xr 14 mg daily but is not never tried a higher dose yet.  He continues to have chronic dizziness which is likely  appears to be multifactorial.  His blood pressures been running quite low and today it is 101/68.  He is on Norvasc 10 mg daily and daughter wonders if he can come off it.  Patient was seen in the ER on 09/26/2021 for altered mental status and slurred speech possibly a TIA.  MRI scan of the brain was obtained which I personally reviewed shows no acute abnormality.  Suboptimal due to motion artifacts.  MRI of the brain is also significant motion artifact but no large vessel occlusion.  He has had cognitive impairment noted's following COVID infection in January 2021 and has been started on Namenda since then by his primary physician.  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  He remains on aspirin for stroke prevention which is tolerating well without bruising or bleeding and on Lipitor which also is tolerating well.  He has had no recurrent stroke or TIA symptoms except that visit to the ER in September 2022 with negative neurovascular work-up  ROS:   14 system review of systems performed and negative with exception of those listed in HPI  PMH:  Past Medical History:  Diagnosis Date   COVID-19 2019   Was hospitalized/ has had dizzines on and off since   Dementia [facility])  Dyslipidemia    Hypertension     PSH:  Past Surgical History:  Procedure Laterality Date   NASAL POLYP EXCISION     Surgeries X 5 for removal    Social History:  Social History   Socioeconomic History   Marital status: Married    Spouse name: Not on file   Number of children: Not on file   Years of education: Not on file   Highest education level: Not on file  Occupational History   Occupation: retired  Tobacco Use   Smoking status: Never   Smokeless tobacco: Never  Substance and Sexual Activity   Alcohol use: Never   Drug use: Never   Sexual activity: Not on file  Other Topics Concern   Not on file  Social History Narrative   Not on file    Social Determinants of Health   Financial Resource Strain: Not on file  Food Insecurity: Not on file  Transportation Needs: Not on file  Physical Activity: Not on file  Stress: Not on file  Social Connections: Not on file  Intimate Partner Violence: Not on file    Family History:  Family History  Problem Relation Age of Onset   Heart disease Mother     Medications:   Current Outpatient Medications on File Prior to Visit  Medication Sig Dispense Refill   acetaminophen (TYLENOL) 325 MG tablet Take 1-2 tablets (325-650 mg total) by mouth every 4 (four) hours as needed for mild pain.     aspirin EC 81 MG EC tablet Take 1 tablet (81 mg total) by mouth daily. Swallow whole. 30 tablet 0   feeding supplement (ENSURE ENLIVE / ENSURE PLUS) LIQD Take 237 mLs by mouth 2 (two) times daily between meals. 237 mL 12   meclizine (ANTIVERT) 25 MG tablet Take 12.5 mg by mouth 3 (three) times daily as needed for dizziness or nausea.     Multiple Vitamin (MULTIVITAMIN WITH MINERALS) TABS tablet Take 1 tablet by mouth daily.     traZODone (DESYREL) 50 MG tablet Take 0.5 tablets (25 mg total) by mouth at bedtime as needed for sleep.     vitamin B-12 (CYANOCOBALAMIN) 1000 MCG tablet Take 1 tablet (1,000 mcg total) by mouth daily.     vitamin C (ASCORBIC ACID) 500 MG tablet Take 500 mg by mouth daily.     atorvastatin (LIPITOR) 40 MG tablet Take 1 tablet (40 mg total) by mouth at bedtime. 30 tablet 0   No current facility-administered medications on file prior to visit.    Allergies:   Allergies  Allergen Reactions   Contrast Media [Iodinated Contrast Media]     Other reaction(s): rash/itching   Red Dye    Shellfish Allergy     Other reaction(s): rash/itching      OBJECTIVE:  Physical Exam  Vitals:   01/21/22 1112  BP: 101/68  Pulse: 70  Weight: 181 lb 12.8 oz (82.5 kg)  Height: 6\' 2"  (1.88 m)   Body mass index is 23.34 kg/m. No results found.  General: Frail elderly Caucasian  male,  very pleasant elderly Caucasian male, seated, in no evident distress Head: head normocephalic and atraumatic.   Neck: supple with no carotid or supraclavicular bruits Cardiovascular: regular rate and rhythm, no murmurs Musculoskeletal: no deformity except mild kyphoscoliosis Skin:  no rash/petichiae Vascular:  Normal pulses all extremities   Neurologic Exam Mental Status: Awake and fully alert.   Fluent speech and language.  Disoriented to place and time. Recent memory  impaired and remote memory diminished attention span, concentration and fund of knowledge impaired with daughter providing majority of history. Mood and affect appropriate.  Diminished recall 1/3.  Clock drawing 2/4.  Difficulty with copying intersecting pentagons.  Able to name only 8 animals which can walk on 4 legs.  Mini-Mental status exam scored 17/30. Cranial Nerves: Fundoscopic exam not done. Pupils equal, briskly reactive to light. Extraocular movements full without nystagmus. Visual fields full to confrontation. Hearing mildly diminished bilaterally. Facial sensation intact. Face, tongue, palate moves normally and symmetrically.  Motor: Normal bulk and tone. Normal strength in all tested extremity muscles except mild right foot drop noted Sensory.:  Intact upper extremity sensation bilaterally. R>L LE decreased pinprick, light touch and vibratory sensation distally Coordination: Rapid alternating movements normal in all extremities. Finger-to-nose and heel-to-shin performed accurately left leg with difficulty performing with right leg.  No evidence of action or resting tremors, cogwheel rigidity or bradykinesia. Gait and Station: Slight difficulty.  Able to walk with a walker with slightly cautious and unsteady gait  Reflexes: 1+ and symmetric. Toes downgoing.     MMSE - Mini Mental State Exam 01/21/2022  Orientation to time 2  Orientation to Place 5  Registration 3  Attention/ Calculation 0  Recall 1  Language-  name 2 objects 2  Language- repeat 0  Language- follow 3 step command 3  Language- read & follow direction 1  Write a sentence 0  Copy design 0  Total score 17         ASSESSMENT: Mcallister Dalesio is a 86 y.o. year old male with recent incidental finding of left small hippocampal infarct likely secondary to small vessel disease on 04/02/2021 after initial presentation of lightheadedness and whole body weakness.  Also found to have large AAA and evaluated at [facility].  Vascular risk factors include HTN, large AAA, advanced age, COVID-19 12/2019 with residual persistent dizziness and CKD.  Worsening cognitive decline likely due to mild to moderate Alzheimer's     PLAN:  I had a long discussion with the patient and his daughter regarding his memory loss and cognitive decline as well as remote history of stroke and chronic dizziness and answered questions.  I recommend we increase the dose of Namenda XR to 21 mg daily if tolerated and then subsequently to 28 mg.  I recommend we reduce the dose of Norvasc to 5 mg daily as his blood pressure is quite soft and is complaining of chronic dizziness.  He was encouraged to continue ongoing physical and Occupational Therapy and participate in cognitively challenging activities like solving crossword puzzles, playing bridge and sodoku.  Continue aspirin for stroke prevention and maintain aggressive risk factor modification strict control of hypertension with blood pressure goal below 130/90, lipids with LDL cholesterol goal below 70 mg percent and diabetes with hemoglobin A1c goal below 6.5%.  He will return for follow-up in the future in 3 to 4 months with my nurse practitioner [name] or call earlier if necessary.  Greater than 50% time during this prolonged 45-minute visit was spent on counseling and coordination of care about his remote stroke, chronic dizziness and worsening memory loss and dementia.  Antony Contras, MD [facility] [address] [place],  [address]  Phone [phone] Fax [phone] Note: This document was prepared with digital dictation and possible smart phrase technology. Any transcriptional errors that result from this process are unintentional.

## 2022-01-21 NOTE — Patient Instructions (Signed)
I had a long discussion with the patient and his daughter regarding his memory loss and cognitive decline as well as remote history of stroke and chronic dizziness and answered questions.  I recommend we increase the dose of Namenda XR to 21 mg daily if tolerated and then subsequently to 28 mg.  I recommend we reduce the dose of Norvasc to 5 mg daily as his blood pressure is quite soft and is complaining of chronic dizziness.  He was encouraged to continue ongoing physical and Occupational Therapy and participate in cognitively challenging activities like solving crossword puzzles, playing bridge and sodoku.  Continue aspirin for stroke prevention and maintain aggressive risk factor modification strict control of hypertension with blood pressure goal below 130/90, lipids with LDL cholesterol goal below 70 mg percent and diabetes with hemoglobin A1c goal below 6.5%.  He will return for follow-up in the future in 3 to 4 months with my nurse practitioner Shanda Bumps or call earlier if necessary. Alzheimer's Disease Caregiver Guide Alzheimer's disease is a condition that makes a person: Forget things. Act differently. Have trouble paying attention and doing simple tasks. These things get worse with time. The tips below can help you care for the person. How to help manage lifestyle changes Tips to help with symptoms Be calm and patient. Give simple, short answers to questions. Avoid correcting the person in a negative way. Try not to take things personally, even if the person forgets your name. Do not argue with the person. This may make the person more upset. Tips to lessen frustration Make appointments and do daily tasks when the person is at his or her best. Take your time. Simple tasks may take longer. Allow plenty of time to complete tasks. Limit choices for the person. Involve the person in what you are doing. Keep things organized: Keep a daily routine. Organize medicines in a pillbox for each day  of the week. Keep a calendar in a central location to remind the person of meetings or other activities. Avoid new or crowded places, if possible. Use simple words, short sentences, and a calm voice. Only give one direction at a time. Buy clothes and shoes that are easy to put on and take off. Try to change the subject if the person becomes frustrated or angry. Tips to prevent injury  Keep floors clear. Remove rugs, magazine racks, and floor lamps. Keep hallways well-lit. Put a handrail and non-slip mat in the bathtub or shower. Put childproof locks on cabinets that have dangerous items in them. These items include medicine, alcohol, guns, toxic cleaning items, sharp tools, matches, and lighters. Put locks on doors where the person cannot see or reach them. This helps keep the person from going out of the house and getting lost. Be ready for emergencies. Keep a list of emergency phone numbers and addresses close by. Remove car keys and lock garage doors so that the person does not try to drive. Bracelets may be worn that track location and identify the person as having memory problems. This should be worn at all times for safety. Tips for the future  Discuss financial and legal planning early. People with this disease have trouble managing their money as the disease gets worse. Get help from a professional. Talk about advance directives, safety, and daily care. Take these steps: Create a living will and choose a power of attorney. This is someone who can make decisions for the person with Alzheimer's disease when he or she can no longer do  so. Discuss driving safety and when to stop driving. The person's doctor can help with this. If the person lives alone, make sure he or she is safe. Some people need extra help at home. Other people need more care at a nursing home or care center. How to recognize changes in the person's condition With this disease, memory problems and confusion slowly get  worse. In time, the person may not know his or her friends and family members. The disease can also cause changes in behavior and mood, such as anxiety or anger. The person may see, hear, taste, smell, or feel things that are not real (hallucinate). These changes can come on all of a sudden. They may happen in response to something such as: Pain. An infection. Changes in temperature or noise. Too much stimulation. Feeling lost or scared. Medicines. Where to find support Find out about services that can provide short-term care (respite care). These can allow you to take a break when you need it. Join a support group near you. These groups can help you: Learn ways to manage stress. Share experiences with others. Get emotional comfort and support. Learn about caregiving as the disease gets worse. Know what community resources are available. Where to find more information Alzheimer's Association: LimitLaws.hu Contact a doctor if: The person has a fever. The person has a sudden behavior change that does not get better with calming strategies. The person is not able to take care of himself or herself at home. You are no longer able to care for the person. Get help right away if: The person has a sudden increase in confusion or new hallucinations. The person threatens you or anyone else, including himself or herself. Get help right away if you feel like your loved one may hurt himself or herself or others, or has thoughts about taking his or her own life. Go to your nearest emergency room or: Call your local emergency services (911 in the [place] Call the National Suicide Prevention Lifeline at (714)153-2894 or 988 in the [place] This is open 24 hours a day. Text the Crisis Text Line at 959-101-7768. Summary Alzheimer's disease causes a person to forget things. A person who has this condition may have trouble doing simple tasks. Take steps to keep the person from getting hurt. Plan for future  care. You can find support by joining a support group near you. This information is not intended to replace advice given to you by your health care provider. Make sure you discuss any questions you have with your health care provider. Document Revised: 07/11/2021 Document Reviewed: 04/03/2020 Elsevier Patient Education  2022 [employer].

## 2022-01-22 LAB — DEMENTIA PANEL
Homocysteine: 17 umol/L (ref 0.0–21.3)
RPR Ser Ql: NONREACTIVE
TSH: 1.45 u[IU]/mL (ref 0.450–4.500)
Vitamin B-12: 272 pg/mL (ref 232–1245)

## 2022-01-28 ENCOUNTER — Ambulatory Visit (INDEPENDENT_AMBULATORY_CARE_PROVIDER_SITE_OTHER): Payer: Medicare Other | Admitting: Neurology

## 2022-01-28 DIAGNOSIS — F02A18 Dementia in other diseases classified elsewhere, mild, with other behavioral disturbance: Secondary | ICD-10-CM

## 2022-01-28 DIAGNOSIS — R410 Disorientation, unspecified: Secondary | ICD-10-CM | POA: Diagnosis not present

## 2022-01-28 DIAGNOSIS — G301 Alzheimer's disease with late onset: Secondary | ICD-10-CM

## 2022-02-18 ENCOUNTER — Ambulatory Visit: Payer: Medicare Other | Admitting: Adult Health

## 2022-03-20 ENCOUNTER — Other Ambulatory Visit: Payer: Self-pay | Admitting: Neurology

## 2022-04-24 ENCOUNTER — Ambulatory Visit (INDEPENDENT_AMBULATORY_CARE_PROVIDER_SITE_OTHER): Payer: Medicare Other | Admitting: Adult Health

## 2022-04-24 ENCOUNTER — Encounter: Payer: Self-pay | Admitting: Adult Health

## 2022-04-24 VITALS — BP 104/73 | HR 80 | Ht 74.0 in | Wt 181.0 lb

## 2022-04-24 DIAGNOSIS — I951 Orthostatic hypotension: Secondary | ICD-10-CM | POA: Diagnosis not present

## 2022-04-24 DIAGNOSIS — F02A18 Dementia in other diseases classified elsewhere, mild, with other behavioral disturbance: Secondary | ICD-10-CM | POA: Diagnosis not present

## 2022-04-24 DIAGNOSIS — I639 Cerebral infarction, unspecified: Secondary | ICD-10-CM

## 2022-04-24 DIAGNOSIS — G301 Alzheimer's disease with late onset: Secondary | ICD-10-CM

## 2022-04-24 NOTE — Patient Instructions (Addendum)
Continue Plavix and atorvastatin  for secondary stroke prevention ? ?Please stop aspirin as this is not needed in addition to plavix ? ?Continue Namenda at current dosage  ? ?Continue to follow up with PCP regarding cholesterol and blood pressure management  ?Maintain strict control of hypertension with blood pressure goal below 130/90 and cholesterol with LDL cholesterol (bad cholesterol) goal below 70 mg/dL.  ? ?Signs of a Stroke? Follow the BEFAST method:  ?Balance Watch for a sudden loss of balance, trouble with coordination or vertigo ?Eyes Is there a sudden loss of vision in one or both eyes? Or double vision?  ?Face: Ask the person to smile. Does one side of the face droop or is it numb?  ?Arms: Ask the person to raise both arms. Does one arm drift downward? Is there weakness or numbness of a leg? ?Speech: Ask the person to repeat a simple phrase. Does the speech sound slurred/strange? Is the person confused ? ?Time: If you observe any of these signs, call 911. ? ? ? ? ?Followup in the future with me in 6 months or call earlier if needed ? ? ? ? ? ?Thank you for coming to see [place] at [facility]. I hope we have been able to provide you high quality care today. ? ?You may receive a patient satisfaction survey over the next few weeks. We would appreciate your feedback and comments so that we may continue to improve ourselves and the health of our patients. ? ?

## 2022-04-24 NOTE — Progress Notes (Signed)
?[name] ?[name] ?[place]. [name] ?[phone] ? ?     STROKE FOLLOW UP NOTE ? ?[name] ?Date of Birth:  [birth year] ?Medical Record Number:  [id number]  ? ?Reason for Referral: stroke follow up ? ? ? ?SUBJECTIVE: ? ? ?CHIEF COMPLAINT:  ?Chief Complaint  ?Patient presents with  ? Follow-up  ?  [name] with daughter wylanta & spouse cheryl  ?Pt is well and stable, no changes since last visit per pt and spouse   ? ? ?HPI:  ? ?Update 04/24/2022 JM: 86 year old male returns for stroke and memory follow-up accompanied by his daughter and spouse Maxwell Rollins.  Overall stable since prior visit.  He has not had any hospitalizations since prior visit.  Cognition stable since prior visit.  Remains on Namenda XR 21mg  , denies side effects.  Dementia panel WNL.  EEG negative for seizures. Wife reports episodes since his stroke of starring off which can last for 2-3 minutes, typically occurs multiple times per day. Patient reports he is completely aware during episode, can hear wife speaking with him but just not able to respond. Does have a lightheaded type sensation during the time. Always happens with ambulation and will need to sit or he will fall, has not occurred while sitting. Has not checked BP during episode but does monitor blood pressure intermittently at home and can range 70-110.  Blood pressure today 104/73.  PCP increased midodrine 2.5 TID to 5mg  TID on 4/20. Drinks 1-2 bottles of water per day.  Limited nutrition intake and wife concerned of weight loss.  Continues to work with therapy for LE strengthening and continues to use rollator walker for ambulation.  He has not had any recent falls. Denies new stroke/TIA symptoms.  Compliant on aspirin and atorvastatin, denies side effects.  No further concerns at this time. ? ? ? ? ?History provided for reference purposes only ?Update 01/21/2022 Dr. [name] Patient is seen for follow-up today after last visit with Maxwell Rollins nurse  practitioner on 05/30/2021.  He is accompanied night by his daughter provides most of the history.  Patient has not done well since the last visit with chronic dizziness and falls.  He has had at least 5 visits to emergency room and eventually was to [facility] skilled nursing facility and is now barely been home for 2 weeks or so.  Is currently getting home physical and Occupational Therapy.  He is able to ambulate with a walker.  He has had no recent falls.  Patient has had cognitive decline since her last visit.  He has not only poor short-term memory but also has trouble at times finding words and completing sentences.  He also sundown's.  At times he becomes combative.  He also reminisces a lot about the past.  He is also had occasional hallucinations and on one occasion he called the daughter to come and [place] from the airport and he was at home.  He is currently on Namenda Xr 14 mg daily but is not never tried a higher dose yet.  He continues to have chronic dizziness which is likely appears to be multifactorial.  His blood pressures been running quite low and today it is 101/68.  He is on Norvasc 10 mg daily and daughter wonders if he can come off it.  Patient was seen in the ER on 09/26/2021 for altered mental status and slurred speech possibly a TIA.  MRI scan of the brain was obtained which I personally reviewed  shows no acute abnormality.  Suboptimal due to motion artifacts.  MRI of the brain is also significant motion artifact but no large vessel occlusion.  He has had cognitive impairment noted's following COVID infection in January 2021 and has been started on Namenda since then by his primary physician.  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  He remains on aspirin for stroke prevention which is tolerating well without bruising or bleeding and on Lipitor which also is tolerating well.  He has had no recurrent stroke or TIA symptoms  except that visit to the ER in September 2022 with negative neurovascular work-up ? ? ?Initial visit 05/30/2021 JM: [name] is being seen for hospital follow-up accompanied by his daughter, Maxwell Rollins and friend [name] via telephone. ? ?He continues to reside at [facility] and rehabilitation center in [place], [place] where he continues to receive PT/OT/SLP for residual lower extremity weakness, gait impairment and cognitive impairment.  Daughter does report improvement since discharge.  He is ambulating short distances with a rolling walker and denies any recent falls.  Denies new stroke/TIA symptoms.  Completed 3 weeks DAPT and remains on aspirin alone as well as atorvastatin 40 mg daily without associated side effects.  Blood pressure today 109/73. ? ?Other concerns: ? ?Back pain: Chronic issue.  Has never been seen by neurosurgery or orthopedics.  He has been seen by a chiropractor for "years" per daughter.  He does endorse symptoms of radiculopathy in right leg.  He will experience increased right leg pain with increased ambulation and will resolve shortly after resting.  Denies bilateral lower extremity numbness/tingling or painful paresthesias. ? ?Vertigo/dizziness: per daughter, this has been present since he had a fall 2 years ago in [place].  He was evaluated at local hospital without any intracranial injury but did require sutures in the back of his head.  This has been relatively stable until he was diagnosed with COVID 12/2019 with reported worsening.  He apparently was advised to follow-up with neurology after [facility] admission for further evaluation but this was never pursued.  Daughter is unsure of exact reasoning for neurology evaluation - she believes possibly due to gait impairment with vertigo. Vertigo can be present while ambulating, position changes or quick head movements ? ?Cognitive impairment: Present since COVID 12/2019 with occasional word finding difficulty with worsening after recent  admission.  He can have confusion in the evening and even this morning tried to leave the facility alone to go to todays appointment.  He was recently started on Namenda XR 14 mg daily by SNF.  No known family history of dementia ? ?AAA: Evaluated at [facility] vascular surgery on 05/10/2021 with OV personally reviewed. Per note, discussed treatment options including medical management versus surgical intervention and patient was not interested in repair given associated risks especially given risk of worsening dementia and recent stroke.  No follow-up recommended ? ? ?Stroke admission 04/02/2021 ?Colten Hutchings is a 86 y.o. male with a past medical history per [facility] of hyperlipidemia, hypertension (patient denies this and reports he is not on any antihypertensive medications), former smoking, numbness in the feet, vertigo, stage III Chronic kidney disease, dementia (which patient denies) who presented on 04/02/2021 with lightheadedness and whole body weakness. CT no acute abnormality.  MRI showed left punctate hippocampal infarct.  MRA head, Doppler, 2D echo, LE venous Doppler all unremarkable.  LDL 110, A1c 5.8, UDS negative, creatinine 1.5.  CT abdomen pelvis showed large AAA 6.2 cm, vascular surgery consulted who recommended referral to [facility] for further evaluation.  Stroke felt to be incidental as it does not correlate with presenting symptoms.  Lower extremity weakness felt to be due to lumbar stenosis with MRI lumbar spine multifactoral degenerative changes L4-L5 with mild spinal stenosis and moderate right > left  L4 foraminal stenosis as well as L5 on S1 anterolisthesis.  Symptoms concerning for presyncope recommended further evaluation by medical team.  Recommended DAPT for 3 weeks and aspirin alone.  LDL 110 and increase home dose atorvastatin 20 mg to 40 mg daily.  Other stroke risk factors include advanced age and former tobacco use but no prior stroke history.  Other active  problems include CKD and history of COVID 12/2019 with residual persistent dizziness.  Evaluated by therapies who recommended discharge to CIR for impairments due to weakness, difficulty with mobility as

## 2022-10-07 ENCOUNTER — Telehealth: Payer: Self-pay | Admitting: Adult Health

## 2022-10-07 NOTE — Telephone Encounter (Signed)
LVM and sent mychart msg informing pt of need to reschedule 10/26 appointment - NP out 

## 2022-10-14 ENCOUNTER — Ambulatory Visit: Payer: Medicare Other | Admitting: Neurology

## 2022-10-24 ENCOUNTER — Ambulatory Visit: Payer: Medicare Other | Admitting: Adult Health

## 2022-10-29 ENCOUNTER — Ambulatory Visit: Payer: Medicare Other | Admitting: Adult Health

## 2022-11-04 ENCOUNTER — Ambulatory Visit: Payer: Medicare Other | Admitting: Adult Health

## 2022-11-29 DEATH — deceased
